# Patient Record
Sex: Female | Born: 1960 | Race: White | Hispanic: No | Marital: Married | Smoking: Former smoker
Health system: Southern US, Community
[De-identification: ages and names within clinical notes are randomized; demographics above are authoritative.]

## PROBLEM LIST (undated history)

## (undated) DIAGNOSIS — Z9889 Other specified postprocedural states: Secondary | ICD-10-CM

## (undated) DIAGNOSIS — Z789 Other specified health status: Secondary | ICD-10-CM

## (undated) DIAGNOSIS — R112 Nausea with vomiting, unspecified: Secondary | ICD-10-CM

## (undated) HISTORY — PX: ABDOMINAL HYSTERECTOMY: SHX81

---

## 2005-09-11 ENCOUNTER — Ambulatory Visit: Payer: Self-pay | Admitting: Orthopedic Surgery

## 2005-09-12 ENCOUNTER — Ambulatory Visit (HOSPITAL_COMMUNITY): Admission: RE | Admit: 2005-09-12 | Discharge: 2005-09-12 | Payer: Self-pay | Admitting: Orthopedic Surgery

## 2005-09-30 ENCOUNTER — Ambulatory Visit: Payer: Self-pay | Admitting: Orthopedic Surgery

## 2005-10-08 ENCOUNTER — Ambulatory Visit: Payer: Self-pay | Admitting: Orthopedic Surgery

## 2005-11-15 ENCOUNTER — Ambulatory Visit (HOSPITAL_COMMUNITY): Admission: RE | Admit: 2005-11-15 | Discharge: 2005-11-15 | Payer: Self-pay | Admitting: Orthopedic Surgery

## 2006-11-13 ENCOUNTER — Encounter: Payer: Self-pay | Admitting: Obstetrics and Gynecology

## 2006-11-13 ENCOUNTER — Inpatient Hospital Stay (HOSPITAL_COMMUNITY): Admission: RE | Admit: 2006-11-13 | Discharge: 2006-11-18 | Payer: Self-pay | Admitting: Obstetrics and Gynecology

## 2009-04-03 ENCOUNTER — Ambulatory Visit (HOSPITAL_COMMUNITY): Admission: RE | Admit: 2009-04-03 | Discharge: 2009-04-03 | Payer: Self-pay | Admitting: Obstetrics and Gynecology

## 2009-04-12 ENCOUNTER — Ambulatory Visit (HOSPITAL_COMMUNITY): Admission: RE | Admit: 2009-04-12 | Discharge: 2009-04-12 | Payer: Self-pay | Admitting: Obstetrics and Gynecology

## 2009-12-27 ENCOUNTER — Ambulatory Visit (HOSPITAL_COMMUNITY): Admission: RE | Admit: 2009-12-27 | Discharge: 2009-12-27 | Payer: Self-pay | Admitting: Obstetrics and Gynecology

## 2010-07-31 NOTE — Op Note (Signed)
NAMECEOLA, PARA NO.:  [id number]   MEDICAL RECORD NO.:  [id number]          PATIENT TYPE:  INP   LOCATION:  A309                          FACILITY:  [facility]   PHYSICIAN:  Tilda Burrow, M.D. DATE OF BIRTH:  1960-09-06   DATE OF PROCEDURE:  11/13/2006  DATE OF DISCHARGE:                               OPERATIVE REPORT   PREOPERATIVE DIAGNOSIS:  Postoperative hematoma.   POSTOPERATIVE DIAGNOSIS:  Postoperative hematoma.   PROCEDURE:  Evacuation of wound hematoma.   SURGEON:  Tilda Burrow, M.D.   ASSISTANT:  None.   ANESTHESIA:  General with LMA converted to GTA.   COMPLICATIONS:  None.   ESTIMATED BLOOD LOSS:  Less than 100 mL intraoperatively, greater than  1000 mL of clot removed.   FINDINGS:  A large hematoma that had dissected all the way across the  Pfannenstiel incision and into the adjacent connective tissue and fatty  tissue.  A large active subcu space bleeder, suspected arterial, in the  right side of the subcu fatty space.   DETAILS OF PROCEDURE:  The patient was taken to the operating room,  prepped and draped for lower abdominal surgery.  The old staples were  removed and the wound opened.  There was active bright red blood  identified on the right side and so the initial opening was focused on  that side.  There was active bright red blood that was trailed down to a  pulsatile area in the subcu fatty tissues on the right side.  Two  hemostats were placed in such a way as to cross clamp and achieve  hemostasis in this area.  We then proceeded with opening of the  remainder of the incision and evacuation of a huge amount of hematoma  from the subcu space.  The entire length of the incision was emptied of  hematoma.  There was some extravasation into the adjacent subcu fatty  tissues.  The fascial closure was completely intact, however, it was  obvious that there was serous drainage coming from beneath the fascial  closure.  The  decision was made that a peritoneal opening was required.  The patient was then placed under general anesthesia instead of the  Versed sedation to date.  LMA was initially placed and the abdomen  opened.  The peritoneal cavity was entered.  The sutures holding the  rectus muscles together in the midline had been disrupted.  The  peritoneal cavity was opened.  A generous amount of serous bloody fluid  was noted in the pelvis with absolutely no clots.  This was evidence  that the bleeding was from external and had worked its way back in.  The  patient had to be put to sleep due to some deep respiratory breathing  and Valsalva maneuver resulting in expulsion of the bowel through the  incision, so we placed general anesthesia allowing greater relaxation.  The bowel could then be manipulated, irrigated, and suctioned out, to  remove the bloody fluid.  Once the fluid began to clear, we inspected  the pelvic floor and no clots or evidence of  intra-abdominal bleeding  were encountered.  The peritoneal cavity was then closed using running 2-  0 chromic and then the muscle was pulled together, once again, with  interrupted 2-0 chromic.  The fascia was pulled back into place and  closed with running 0 Vicryl.  The right side of the incision was closed  as two layers, the external and internal oblique, in order to improve  flexibility on that side as the opening of those two layers had been  slightly asymmetric.  The subcu spaces were reinspected and confirmed as  being hemostatic.  There was some diffuse ooze from the intravasation  into the connective tissue so a JP drain was placed and allowed to exit  through a separate stab incision in the right lower quadrant.  The  patient then had reapproximation of the subcu fatty space with a series  of interrupted 2-0 plain sutures which pulled the skin edges into  approximation.  Stapled closure was used at this time with good skin  edge approximation and  suturing of the drain into place was performed.  The patient tolerated the procedure well and went to the recovery room  in good condition.  She will be monitored with abdominal pressure  dressing in place.      Tilda Burrow, M.D.  Electronically Signed     JVF/MEDQ  D:  11/13/2006  T:  11/14/2006  Job:  [id number]

## 2010-07-31 NOTE — H&P (Signed)
Natalie Hess, Natalie Hess              ACCOUNT NO.:  [id number]   MEDICAL RECORD NO.:  [id number]          PATIENT TYPE:  AMB   LOCATION:  DAY                           FACILITY:  [facility]   PHYSICIAN:  Tilda Burrow, M.D. DATE OF BIRTH:  09-16-1960   DATE OF ADMISSION:  DATE OF DISCHARGE:  LH                              HISTORY & PHYSICAL   ADMITTING DIAGNOSES:  1. Uterine fibroid, 14-15 weeks' size.  2. Right lower quadrant discomfort, ovarian versus uterine.   HISTORY OF PRESENT ILLNESS:  This 50 year old female, gravida 3, para 2,  AB1, status post C-section x2 and status post tubal ligation, is  admitted at this time for abdominal hysterectomy.  She has recently been  seen for GYN visit, complaining of sense of pelvic pressure and right  lower quadrant discomfort and there is some pain with her periods and  periods are described as heavy.  Laboratory evaluation included a  hemoglobin of 10.7, hematocrit 37 with normal thyroid function tests and  normal electrolytes, BUN 10, creatinine 0.9.  She is admitted for  hysterectomy.  She also desires that the right tube and ovary be  removed.  She has been having a lot of pain on that side.  We have  discussed the possibility that the pain may be related to the pressure  from the uterine fibroid and that there may be nothing wrong with the  right ovary; she nonetheless desires to proceed with removal.  We have  done a limited ultrasound in the office showing a small right ovarian  cyst and huge fibroid uterus at 14-15 weeks' size.  Plan is to excise  the old cicatrix from her C-sections as well.  She has some irregularity  of her prior C-section and has been meticulous in keeping her weight  down and desires to have this recontoured.   PAST MEDICAL HISTORY:  Benign.   SURGICAL HISTORY:  1. C-section x2.  2. Tubal ligation.  3. Herniorrhaphy.   FAMILY HISTORY:  Positive for melanoma in the mother.   SOCIAL HISTORY:  Nonsmoker,  nondrinker, rare social alcohol.  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   PHYSICAL EXAMINATION:  VITAL SIGNS:  Height 5 feet 3 inches, weight 126.  Blood pressure 128/78, pulse 72.  BMP 22.  Urinalysis negative.  Hemoglobin 9.9.  GENERAL:  Exam shows a healthy, attractive Caucasian female in no acute  distress.  SKIN:  Within normal limits.  NECK:  Supple.  Trachea midline.  Normal thyroid.  LUNGS:  Clear to auscultation.  ABDOMEN:  Normal.  BREASTS:  Exam deferred.  ABDOMEN:  Nontender without tenderness with the uterus palpable above  the symphysis pubis, 14-15 weeks' size.  RECTAL:  Hemoccult negative.   ACCESSORY CLINICAL DATA:  Pap smear has returned normal.   PLAN:  Abdominal hysterectomy and right salpingo-oophorectomy on November 12, 2006.      Tilda Burrow, M.D.  Electronically Signed     JVF/MEDQ  D:  11/12/2006  T:  11/13/2006  Job:  [id number]   cc:   [facility]

## 2010-07-31 NOTE — Discharge Summary (Signed)
NAMEJOSCELIN, Natalie Hess NO.:  [id number]   MEDICAL RECORD NO.:  [id number]          PATIENT TYPE:  INP   LOCATION:  A309                          FACILITY:  [facility]   PHYSICIAN:  Tilda Burrow, M.D. DATE OF BIRTH:  10/07/1960   DATE OF ADMISSION:  11/13/2006  DATE OF DISCHARGE:  09/02/2008LH                               DISCHARGE SUMMARY   ADMISSION DIAGNOSES:  1. Uterine fibroids.  2. Right lower quadrant pain.   DISCHARGE DIAGNOSES:  1. Uterine fibroids.  2. Right lower quadrant pain.  3. Postoperative wound hematoma.  4. Anemia, secondary to postoperative wound hematoma.   PROCEDURE:  1. On November 13, 2006, a total abdominal hysterectomy, right salpingo-      oophorectomy, Dr. Tilda Burrow.  2. Also on November 13, 2006, a re-exploration of  the abdomen with      evacuation of wound hematoma and the placement of a JP drain.   DISCHARGE MEDICATIONS:  1. Repliva one tab daily x30 days.  2. MiraLax laxative 17 grams p.o. twice daily with water.  3. Percocet 5/325 mg, one p.o. q.6h. p.r.n. pain.  4. Ambien 10 mg p.o. at bedtime.  5. Hydrochlorothiazide 25 mg #14 tab, one tab x2 weeks.   HISTORY:  This slim 69+ year old female was admitted for an abdominal  hysterectomy, right salpingo-oophorectomy as describes.  The operative  report indicates the straightforward surgery.  Unfortunately during the  postoperative phase she began to have incisional bleeding which was  fairly vigorous, even though the incision was dry at the time of the  initial surgery.  She had an admitting hemoglobin of 9, hematocrit 36.5,  with the hemoglobin dropping to 8.3 and hematocrit of 23.6 on  postoperative day one, staying in that range for three days.  On  November 17, 2006, she had a 6.9 hemoglobin and hematocrit of 20.4, felt  attributable to fluid hydration and return of third space and fluid into  the vascular system.  She otherwise felt subjectively good enough at  that  time and a cath and transfusion with 1 unit of packed cells and go  back to her routine size at this point, was performed.  The patient  remained stable.   She was discharged home, having finally done well, on November 18, 2006,  for followup in three days for staple removal and Pratt drain removal.      Tilda Burrow, M.D.  Electronically Signed     JVF/MEDQ  D:  11/18/2006  T:  11/18/2006  Job:  [id number]

## 2010-07-31 NOTE — Op Note (Signed)
Natalie Hess, Natalie Hess NO.:  [id number]   MEDICAL RECORD NO.:  [id number]          PATIENT TYPE:  INP   LOCATION:  A309                          FACILITY:  [facility]   PHYSICIAN:  Tilda Burrow, M.D. DATE OF BIRTH:  1960-05-25   DATE OF PROCEDURE:  DATE OF DISCHARGE:                               OPERATIVE REPORT   PREOPERATIVE DIAGNOSES:  1. Uterine fibroids.  2. Right lower quadrant pain.   POSTOPERATIVE DIAGNOSES:  1. Uterine fibroids.  2. Right lower quadrant pain.   PROCEDURE PERFORMED:  Total abdominal hysterectomy, right salpingo-  oophorectomy.   SURGEON:  Tilda Burrow, M.D.   ASSISTANTAnnabell Howells, RN.   ANESTHESIA:  General.   COMPLICATIONS:  None.   FINDINGS:  Large 14-15 week size uterus, anterior 10 cm fibroid, very  high position of the bladder flat, normal-appearing tube and ovary,  adhesions from the left ovary to the cul de sac.   DETAILS OF PROCEDURE:  The patient was taken to the operating room,  prepped and draped.  A Pfannenstiel incision repeated with the excision  of cicatrix.  In opening the fascia, the rectus muscles were quite far  apart and the peritoneal cavity was entered quickly and during the  dissection of the upper elevation of the fascia.  The contents were  inspected and there was absolutely no evidence of injury.  Only omental  fat tissues were directly beneath the opening.  The opening was  extended, omental adhesions to anterior abdominal wall immobilized, and  Alexis wound retractor positioned.  The large uterus could be extracted  through the incision with total access  to the round ligaments on each  side, which were clamped, cut and suture ligated.  The bladder flap was  extremely high on the anterior uterus, reaching out to almost the top of  the anterior fibroid, reaching out to the level of the round ligament on  the right.  Bladder flap was carefully immobilized and at no time with  any suspicion of bladder  injury.  Once the bladder was down  sufficiently, we could identify the right adnexal structures with ease.  The infundibulopelvic ligament was isolated, and the ureter palpably  identified well out of harms way out on the right side, far below where  we were working.  The right IP ligament was clamped, cut and suture  ligated and uterine vessels skeletonized on both sides.  The left side  was treated with round ligament takedown followed by isolation of the  uterus of the left uteroovarian ligament and broad ligament.  Thus, the  specimen was clamped, cut and suture ligated.  The uterine vessels were  skeletonized, crossclamped with a curved Heaney clamp bilaterally,  transected and suture ligated.  The upper cardinal ligaments were  clamped, cut and suture ligated.  At this point, we were only a couple  of centimeters from the anterior cervical vaginal fornix.  The uterus  was amputated off the vaginal cuff for Improved visibility.  The  pediclesinspected.  The anterior cervical vaginal fornix could be  identified and a stab incision made  into the vagina with Kocher clamp  used to grasp the vaginal mucosa.  Cervix was circumscribed and  amputated off of the vaginal cuff.  Aldridge stitches were then placed  in each lateral vaginal angle.  Vaginal cuff was fairly well supported  and at this point, closure of the cuff consists of a placement of an  Aldridge stitch at each lateral vaginal angle to attach the cuff to the  lower cardinal ligaments.  Then, a continuous running locking closure of  the vaginal cuff with good hemostasis.  The pelvis was inspected,  irrigated and no abnormalities identified other than adhesions from the  left ovary to the pelvic floor, which were freed up.  The irrigation of  the pelvis followed sponge and needle counts were correct.  Anterior  peritoneum closed.  We had to modify the fascial closure as it appeared  that the rectus muscles had some diastasis,  despite her overall good  health.  The rectus muscles were loosely reapproximated in the midline  after peritoneal closure with 2-0 Chromic.  Final sponge and needle  counts were correct.  The fascia was closed using 0 Vicryl.  Subcu fatty  tissues were irrigated, reapproximated with 2-0 Chromic.  Staple closure  of the skin was necessary on the left 1/3 of the incision.  Keith needle  subcuticular closure with overlying Dermabond was possible in the right  2/3 of the incision but due to oozing, the left 1/3 of the incision did  require some staple closure.  Staples will be taken out shortly.  The  patient tolerated the procedure well, went to recovery room in good  condition.  Sponge and needle counts correct.      Tilda Burrow, M.D.  Electronically Signed     JVF/MEDQ  D:  11/13/2006  T:  11/14/2006  Job:  [id number]

## 2010-12-28 LAB — DIFFERENTIAL
Basophils Absolute: 0
Basophils Absolute: 0
Basophils Absolute: 0.1
Basophils Relative: 1
Basophils Relative: 0
Basophils Relative: 0
Basophils Relative: 1
Eosinophils Absolute: 0.2
Eosinophils Relative: 0
Eosinophils Relative: 3
Lymphocytes Relative: 14
Lymphocytes Relative: 14
Lymphocytes Relative: 2 — ABNORMAL LOW
Lymphs Abs: 0.3 — ABNORMAL LOW
Monocytes Absolute: 0.3
Monocytes Relative: 9
Monocytes Relative: 5
Monocytes Relative: 7
Monocytes Relative: 8
Neutro Abs: 3.8
Neutro Abs: 4.2
Neutro Abs: 6.4
Neutrophils Relative %: 69
Neutrophils Relative %: 75
Neutrophils Relative %: 75

## 2010-12-28 LAB — CBC
HCT: 24.6 — ABNORMAL LOW
HCT: 24.7 — ABNORMAL LOW
HCT: 26.7 — ABNORMAL LOW
HCT: 32 — ABNORMAL LOW
Hemoglobin: 6.9 — CL
Hemoglobin: 11.9 — ABNORMAL LOW
Hemoglobin: 8.3 — ABNORMAL LOW
Hemoglobin: 8.8 — ABNORMAL LOW
MCHC: 33.5
MCHC: 32.5
MCV: 76.8 — ABNORMAL LOW
MCV: 74.9 — ABNORMAL LOW
MCV: 77.1 — ABNORMAL LOW
Platelets: 189
Platelets: 209
Platelets: 240
RBC: 2.66 — ABNORMAL LOW
RBC: 3.24 — ABNORMAL LOW
RBC: 4.87
RDW: 24.1 — ABNORMAL HIGH
RDW: 25 — ABNORMAL HIGH
WBC: 5.2
WBC: 5.6

## 2010-12-28 LAB — BASIC METABOLIC PANEL
BUN: 2 — ABNORMAL LOW
CO2: 26
Chloride: 104
Chloride: 102
Creatinine, Ser: 0.56
GFR calc Af Amer: >60
Glucose, Bld: 90
Glucose, Bld: 114 — ABNORMAL HIGH
Potassium: 3.8
Sodium: 137

## 2010-12-28 LAB — CROSSMATCH

## 2010-12-28 LAB — TYPE AND SCREEN: Antibody Screen: NEGATIVE

## 2010-12-28 LAB — COMPREHENSIVE METABOLIC PANEL
ALT: 13
CO2: 29
Calcium: 9
Creatinine, Ser: 0.73
GFR calc non Af Amer: >60
Glucose, Bld: 91

## 2010-12-28 LAB — HCG, QUANTITATIVE, PREGNANCY: hCG, Beta Chain, Quant, S: <2

## 2011-07-18 ENCOUNTER — Other Ambulatory Visit: Payer: Self-pay | Admitting: Adult Health

## 2011-07-18 DIAGNOSIS — Z139 Encounter for screening, unspecified: Secondary | ICD-10-CM

## 2011-07-18 DIAGNOSIS — R6889 Other general symptoms and signs: Secondary | ICD-10-CM

## 2011-07-31 ENCOUNTER — Ambulatory Visit (HOSPITAL_COMMUNITY)
Admission: RE | Admit: 2011-07-31 | Discharge: 2011-07-31 | Disposition: A | Discharge status: Home / Self Care | Payer: 59 | Source: Ambulatory Visit | Attending: Adult Health | Admitting: Adult Health

## 2011-07-31 DIAGNOSIS — R6889 Other general symptoms and signs: Secondary | ICD-10-CM

## 2011-07-31 DIAGNOSIS — R928 Other abnormal and inconclusive findings on diagnostic imaging of breast: Secondary | ICD-10-CM | POA: Insufficient documentation

## 2012-03-30 ENCOUNTER — Other Ambulatory Visit: Payer: Self-pay | Admitting: Orthopedic Surgery

## 2012-03-30 DIAGNOSIS — M62838 Other muscle spasm: Secondary | ICD-10-CM

## 2012-03-30 MED ORDER — METHOCARBAMOL 500 MG PO TABS: 500.0000 mg | ORAL_TABLET | Freq: Four times a day (QID) | ORAL | Status: DC | PRN

## 2012-03-30 NOTE — Telephone Encounter (Signed)
52 yo WF left sided neck spasm no trauma  Normal neuro exam mild stiffness left side of neck more pain with flexion   Tender left trap

## 2012-03-31 ENCOUNTER — Other Ambulatory Visit: Payer: Self-pay | Admitting: Orthopedic Surgery

## 2012-03-31 DIAGNOSIS — M62838 Other muscle spasm: Secondary | ICD-10-CM

## 2012-03-31 MED ORDER — METHOCARBAMOL 500 MG PO TABS: 500.0000 mg | ORAL_TABLET | Freq: Four times a day (QID) | ORAL | Status: DC

## 2013-03-03 ENCOUNTER — Other Ambulatory Visit: Payer: Self-pay | Admitting: Obstetrics and Gynecology

## 2013-03-03 DIAGNOSIS — Z139 Encounter for screening, unspecified: Secondary | ICD-10-CM

## 2013-03-05 ENCOUNTER — Ambulatory Visit (HOSPITAL_COMMUNITY)
Admission: RE | Admit: 2013-03-05 | Discharge: 2013-03-05 | Disposition: A | Discharge status: Home / Self Care | Payer: 59 | Source: Ambulatory Visit | Attending: Obstetrics and Gynecology | Admitting: Obstetrics and Gynecology

## 2013-03-05 DIAGNOSIS — Z139 Encounter for screening, unspecified: Secondary | ICD-10-CM

## 2013-03-05 DIAGNOSIS — Z1231 Encounter for screening mammogram for malignant neoplasm of breast: Secondary | ICD-10-CM | POA: Insufficient documentation

## 2013-12-14 ENCOUNTER — Ambulatory Visit (HOSPITAL_COMMUNITY)
Admission: RE | Admit: 2013-12-14 | Discharge: 2013-12-14 | Disposition: A | Discharge status: Home / Self Care | Payer: 59 | Source: Ambulatory Visit | Attending: Orthopaedic Surgery | Admitting: Orthopaedic Surgery

## 2013-12-14 DIAGNOSIS — M25519 Pain in unspecified shoulder: Secondary | ICD-10-CM | POA: Diagnosis not present

## 2013-12-14 DIAGNOSIS — M25619 Stiffness of unspecified shoulder, not elsewhere classified: Secondary | ICD-10-CM | POA: Diagnosis not present

## 2013-12-14 DIAGNOSIS — M6281 Muscle weakness (generalized): Secondary | ICD-10-CM | POA: Insufficient documentation

## 2013-12-14 DIAGNOSIS — M25511 Pain in right shoulder: Secondary | ICD-10-CM

## 2013-12-14 DIAGNOSIS — M25611 Stiffness of right shoulder, not elsewhere classified: Secondary | ICD-10-CM | POA: Insufficient documentation

## 2013-12-14 DIAGNOSIS — IMO0001 Reserved for inherently not codable concepts without codable children: Secondary | ICD-10-CM | POA: Insufficient documentation

## 2013-12-14 NOTE — Evaluation (Signed)
Note reviewed by clinical instructor and accurately reflects treatment session.  Icy Fuhrmann, OTR/L,CBIS   

## 2013-12-14 NOTE — Evaluation (Signed)
Occupational Therapy Evaluation  Patient Details  Name: Natalie Hess MRN: [id number] Date of Birth: 1961/03/12  Today's Date: 12/14/2013 Time: 3220-2542 OT Time Calculation (min): 41 min OT eval: [id number] 83'  Visit#: 1 of 8  Re-eval: 01/11/14  Assessment Diagnosis: Right shoulder pain Next MD Visit: None Prior Therapy: None  Past Medical History: No past medical history on file. Past Surgical History: No past surgical history on file.  Subjective Symptoms/Limitations Symptoms: S: I'm just tired of dealing with pain all the time and the chiropractor only helps for a short time so I decided to go ahead and come here.  Pertinent History: Pt is a 53 y/o female with right shoulder pain and stiffness.  Pt reports difficulty with daily tasks including making a bed, reaching behind her, throwing motions, and riding a lawnmower.  Pt reports she has been dealing with the pain for approximately 1 year and has been seeing a chiropractor, however those treatments only help for a short time. 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 Patient reports working out at the gym will aggravate the shoulder sometimes. Patient uses prescription pain medication and ice for pain management.  Dr. Luna Glasgow referred patient to occupational therapy for evaluation and treatment.   Special Tests: FOTO Score: 61/100 (39% impairment) Patient Stated Goals: To get rid of the pain in my shoulder when I move it.  Pain Assessment Currently in Pain?: Yes Pain Score: 4  Pain Location: Shoulder Pain Orientation: Right Pain Type: Acute pain  Precautions/Restrictions  Precautions Precautions: None  Balance Screening Balance Screen Has the patient fallen in the past 6 months: No Has the patient had a decrease in activity level because of a fear of falling? : No Is the patient reluctant to leave their home because of a fear of falling? : No  Prior  [place] expects to be discharged to:: Private residence Living Arrangements: Spouse/significant other;Children Available Help at Discharge: Family Prior Function  Able to Take Stairs?: Yes Driving: Yes Vocation: Full time employment Vocation Requirements: Bending, lifting heavy objects/people, twisting, reaching; lots of repetitive motions Leisure: Hobbies-yes (Comment) Comments: yardwork/mowing, working out at gym  Assessment ADL/Vision/Perception ADL ADL Comments: Patient reports short periods of pain during daily activities-throwing, lifting, reaching behind her, making bed.  Dominant Hand: Left Vision - History Baseline Vision: No visual deficits  Cognition/Observation Cognition Overall Cognitive Status: Within Functional Limits for tasks assessed Arousal/Alertness: Awake/alert Orientation Level: Oriented X4    Additional Assessments RUE AROM (degrees) RUE Overall AROM Comments:  (Assessed in standing, IR/ER adducted) Right Shoulder Flexion: 141 Degrees Right Shoulder ABduction: 152 Degrees Right Shoulder Internal Rotation: 90 Degrees Right Shoulder External Rotation: 74 Degrees RUE Strength RUE Overall Strength Comments: assessed in sitting Right Shoulder Flexion: 4/5 Right Shoulder ABduction: 4/5 Right Shoulder Internal Rotation: 3+/5 Right Shoulder External Rotation: 4/5 Palpation Palpation: Mod fascial restrictions in the right upper arm, trapezius, and scapularis regions.           Occupational Therapy Assessment and Plan OT Assessment and Plan Clinical Impression Statement: A: Pt is a 53 y/o female presenting with right shoulder pain and stiffness, causing increased pain and fascial restrictions, decreased joint mobility and decreased strength resulting in difficulty completing daily activities and work tasks.   Pt will benefit from skilled therapeutic intervention in order to improve on the following deficits: Impaired UE  functional use;Increased fascial restricitons;Decreased range of motion;Decreased strength;Pain Rehab  Potential: Excellent OT Frequency: Min 2X/week OT Duration: 4 weeks OT Treatment/Interventions: Therapeutic activities;Therapeutic exercise;Patient/family education;Manual therapy;Modalities;Self-care/ADL training OT Plan: P: Pt will benefit from skilled occupational therapy interventions in order to decrease pain, increase ROM and strength, and increase overall RUE functional use. Treatment Plan: AROM, MFR and manual stretching, scapular strengtheing and proximal stabilization, RUE general strengthening.    Goals Short Term Goals Time to Complete Short Term Goals: 2 weeks Short Term Goal 1: Patient will be educated on HEP.  Short Term Goal 2: Patient will decrease fascial restrictions from mod to min amount.  Short Term Goal 3: Patient will decrease pain to 4/10 during daily  tasks.  Long Term Goals Time to Complete Long Term Goals: 4 weeks Long Term Goal 1: Patient will return to highest level of independence during daily and work tasks.  Long Term Goal 2: Patient will decrease fascial restrictions from min to trace amounts or less.  Long Term Goal 3: Patient will decrease pain to 1/10 or less during daily tasks.  Long Term Goal 4: Patient will increase AROM to WNL to increase ability to reach for supplies in overhead cabinets.  Long Term Goal 5: Patient will increase strength to 5/5 to increase ability to lift heavy objects during work tasks.   Problem List Patient Active Problem List   Diagnosis Date Noted  . Pain in joint, shoulder region 12/14/2013  . Muscle weakness (generalized) 12/14/2013  . Decreased range of motion of right shoulder 12/14/2013    End of Session Activity Tolerance: Patient tolerated treatment well General Behavior During Therapy: [facility] for tasks assessed/performed OT Plan of Care OT Home Exercise Plan: shoulder stretches OT Patient Instructions: Handout  (scanned) Consulted and Agree with Plan of Care: Patient    Guadelupe Sabin. OT Student  12/14/2013, 4:53 PM  Physician Documentation Your signature is required to indicate approval of the treatment plan as stated above.  Please sign and either send electronically or make a copy of this report for your files and return this physician signed original.  Please mark one 1.__approve of plan  2. ___approve of plan with the following conditions.   ______________________________                                                          _____________________ Physician Signature                                                                                                             Date

## 2013-12-15 ENCOUNTER — Other Ambulatory Visit (HOSPITAL_COMMUNITY): Payer: Self-pay | Admitting: Orthopaedic Surgery

## 2013-12-15 DIAGNOSIS — M25511 Pain in right shoulder: Secondary | ICD-10-CM

## 2013-12-16 ENCOUNTER — Ambulatory Visit (HOSPITAL_COMMUNITY)
Admission: RE | Admit: 2013-12-16 | Discharge: 2013-12-16 | Disposition: A | Discharge status: Home / Self Care | Payer: 59 | Source: Ambulatory Visit | Attending: Orthopaedic Surgery | Admitting: Orthopaedic Surgery

## 2013-12-16 DIAGNOSIS — M25611 Stiffness of right shoulder, not elsewhere classified: Secondary | ICD-10-CM | POA: Diagnosis not present

## 2013-12-16 DIAGNOSIS — Z5189 Encounter for other specified aftercare: Secondary | ICD-10-CM | POA: Diagnosis present

## 2013-12-16 DIAGNOSIS — M6281 Muscle weakness (generalized): Secondary | ICD-10-CM | POA: Diagnosis not present

## 2013-12-16 DIAGNOSIS — M25511 Pain in right shoulder: Secondary | ICD-10-CM | POA: Diagnosis not present

## 2013-12-16 NOTE — Progress Notes (Signed)
Occupational Therapy Treatment Patient Details  Name: Natalie Hess MRN: [id number] Date of Birth: Apr 24, 1960  Today's Date: 12/16/2013 Time: 6384-6659 OT Time Calculation (min): 39 min Manual 1525-1548 (23') Therapeutic Exercises 9357-0177 (16')  Visit#: 2 of 8  Re-eval: 01/11/14    Authorization:    Authorization Time Period:    Authorization Visit#:   of    Subjective Symptoms/Limitations Symptoms: "its not bad, just certain ways I move." Pain Assessment Currently in Pain?: Yes Pain Score: 3  Pain Location: Shoulder Pain Orientation: Right Pain Type: Acute pain  Precautions/Restrictions     Exercise/Treatments Supine Protraction: PROM;5 reps;AROM;10 reps Horizontal ABduction: PROM;5 reps;AROM;10 reps External Rotation: PROM;5 reps;AROM;10 reps Internal Rotation: PROM;5 reps;AROM;10 reps Flexion: PROM;5 reps;AROM;10 reps ABduction: PROM;5 reps;AROM;10 reps Seated Elevation: AROM;10 reps Extension: AROM;10 reps Row: AROM;10 reps    Manual Therapy Manual Therapy: Myofascial release Myofascial Release: myofascial release (MFR) and manual stretching to RUE bicep, upper arm, upper trap, and scap regions to decre fascail restrictiosn and promote decreased pain.  Occupational Therapy Assessment and Plan OT Assessment and Plan Clinical Impression Statement: Pt had injection on Monday of this week, and has some improvements in pain. Initiated MFR, PROM, and AROM this session, with good tolerance.  Educated pt on use of tennis ball or cane for trigger point release on shoulder blade. pt verbalized understanding and said she would purchase tennis balls.  Also educated on use of ice vs ehat and recommended heat for decreasing musle tightness. OT Plan: Increase AROM reps.  Add scapular theraband.  Follow up on tennis balls for trigger point release.   Goals Short Term Goals Short Term Goal 1: Patient will be educated on HEP.  Short Term Goal 1 Progress: Progressing toward  goal Short Term Goal 2: Patient will decrease fascial restrictions from mod to min amount.  Short Term Goal 2 Progress: Progressing toward goal Short Term Goal 3: Patient will decrease pain to 4/10 during daily  tasks.  Short Term Goal 3 Progress: Progressing toward goal Long Term Goals Long Term Goal 1: Patient will return to highest level of independence during daily and work tasks.  Long Term Goal 1 Progress: Progressing toward goal Long Term Goal 2: Patient will decrease fascial restrictions from min to trace amounts or less.  Long Term Goal 2 Progress: Progressing toward goal Long Term Goal 3: Patient will decrease pain to 1/10 or less during daily tasks.  Long Term Goal 3 Progress: Progressing toward goal Long Term Goal 4: Patient will increase AROM to WNL to increase ability to reach for supplies in overhead cabinets.  Long Term Goal 4 Progress: Progressing toward goal Long Term Goal 5: Patient will increase strength to 5/5 to increase ability to lift heavy objects during work tasks.  Long Term Goal 5 Progress: Progressing toward goal  Problem List Patient Active Problem List   Diagnosis Date Noted  . Pain in joint, shoulder region 12/14/2013  . Muscle weakness (generalized) 12/14/2013  . Decreased range of motion of right shoulder 12/14/2013    End of Session Activity Tolerance: Patient tolerated treatment well General Behavior During Therapy: [facility] for tasks assessed/performed  GO    Bea Graff [place], [place], OTR/L [place] [phone] 12/16/2013, 4:56 PM

## 2013-12-21 ENCOUNTER — Ambulatory Visit (HOSPITAL_COMMUNITY): Payer: 59

## 2013-12-22 ENCOUNTER — Ambulatory Visit (HOSPITAL_COMMUNITY)
Admission: RE | Admit: 2013-12-22 | Discharge: 2013-12-22 | Disposition: A | Discharge status: Home / Self Care | Payer: 59 | Source: Ambulatory Visit

## 2013-12-22 DIAGNOSIS — Z5189 Encounter for other specified aftercare: Secondary | ICD-10-CM | POA: Diagnosis not present

## 2013-12-22 NOTE — Progress Notes (Signed)
Occupational Therapy Treatment Patient Details  Name: Natalie Hess MRN: [id number] Date of Birth: [birth year]  Today's Date: 12/22/2013 Time: 6226-3335 OT Time Calculation (min): 42 min MFR: 4562-5638 93' Therex: 7342-8768 29'  Visit#: 3 of 8  Re-eval: 01/11/14   Subjective Symptoms/Limitations Symptoms: S: I tried that tennis ball, but I just couldn't stand it.  Pain Assessment Currently in Pain?: No/denies  Precautions/Restrictions  Precautions Precautions: None  Exercise/Treatments Supine Protraction: PROM;5 reps;AROM;12 reps Horizontal ABduction: PROM;5 reps;AROM;12 reps External Rotation: PROM;5 reps;AROM;12 reps Internal Rotation: PROM;5 reps;AROM;12 reps Flexion: PROM;5 reps;AROM;12 reps ABduction: PROM;5 reps;AROM;12 reps   Standing Protraction: AROM;12 reps Horizontal ABduction: AROM;12 reps External Rotation: AROM;12 reps Internal Rotation: AROM;12 reps Flexion: AROM;12 reps ABduction: AROM;12 reps Extension: Theraband;12 reps Theraband Level (Shoulder Extension): Level 2 (Red) Row: Theraband;12 reps Theraband Level (Shoulder Row): Level 2 (Red) Retraction: Theraband;12 reps Theraband Level (Shoulder Retraction): Level 2 (Red)        Manual Therapy Manual Therapy: Myofascial release Myofascial Release: myofascial release (MFR) and manual stretching to RUE bicep, upper arm, upper trap, and scap regions to decre fascail restrictiosn and promote decreased pain  Occupational Therapy Assessment and Plan OT Assessment and Plan Clinical Impression Statement: A: Added AROM exercises in standing. Increased AROM repetitions to 12. Added red scapular theraband exercises. Patient tolerated treatment well. Patient reports pain around anterior shoulder-anterior deltoid and pec minor regions, and feels this is where most of her problem is. Patient reports she tried the tennis ball for trigger point release but it was too painful and she could not handle that exercise.  Discussed cutting back on workout routine-either reducing weight amount, repetitions, or number of workouts per week.   OT Plan: P: Focus on fascial restrictions around scapula and on tightness in anterior deltoid/pec minor regions. Follow up on workout routine.    Goals Short Term Goals Short Term Goal 1: Patient will be educated on HEP.  Short Term Goal 1 Progress: Progressing toward goal Short Term Goal 2: Patient will decrease fascial restrictions from mod to min amount.  Short Term Goal 2 Progress: Progressing toward goal Short Term Goal 3: Patient will decrease pain to 4/10 during daily  tasks.  Short Term Goal 3 Progress: Progressing toward goal Long Term Goals Long Term Goal 1: Patient will return to highest level of independence during daily and work tasks.  Long Term Goal 1 Progress: Progressing toward goal Long Term Goal 2: Patient will decrease fascial restrictions from min to trace amounts or less.  Long Term Goal 2 Progress: Progressing toward goal Long Term Goal 3: Patient will decrease pain to 1/10 or less during daily tasks.  Long Term Goal 3 Progress: Progressing toward goal Long Term Goal 4: Patient will increase AROM to WNL to increase ability to reach for supplies in overhead cabinets.  Long Term Goal 4 Progress: Progressing toward goal Long Term Goal 5: Patient will increase strength to 5/5 to increase ability to lift heavy objects during work tasks.  Long Term Goal 5 Progress: Progressing toward goal  Problem List Patient Active Problem List   Diagnosis Date Noted  . Pain in joint, shoulder region 12/14/2013  . Muscle weakness (generalized) 12/14/2013  . Decreased range of motion of right shoulder 12/14/2013    End of Session Activity Tolerance: Patient tolerated treatment well General Behavior During Therapy: [facility] for tasks assessed/performed   Guadelupe Sabin. OT Student  12/22/2013, 4:12 PM

## 2013-12-22 NOTE — Progress Notes (Signed)
Note reviewed by clinical instructor and accurately reflects treatment session.  Laura Essenmacher, OTR/L,CBIS   

## 2013-12-27 ENCOUNTER — Ambulatory Visit (HOSPITAL_COMMUNITY)
Admission: RE | Admit: 2013-12-27 | Discharge: 2013-12-27 | Disposition: A | Discharge status: Home / Self Care | Payer: 59 | Source: Ambulatory Visit | Attending: Family Medicine | Admitting: Family Medicine

## 2013-12-27 DIAGNOSIS — Z5189 Encounter for other specified aftercare: Secondary | ICD-10-CM | POA: Diagnosis not present

## 2013-12-27 NOTE — Progress Notes (Signed)
Occupational Therapy Treatment Patient Details  Name: Natalie Hess MRN: [id number] Date of Birth: 1960-11-04  Today's Date: 12/27/2013 Time: 1520-1610 OT Time Calculation (min): 50 min Manual 1520-1540 (20') Therapeutic Exercises 1540-1610 (30')  Visit#: 4 of 8  Re-eval: 01/11/14    Authorization:    Authorization Time Period:    Authorization Visit#:   of    Subjective Symptoms/Limitations Symptoms: "Its feeling better in there. That injection really helped." Pain Assessment Currently in Pain?: No/denies  Precautions/Restrictions     Exercise/Treatments Supine Protraction: PROM;5 reps;AROM;15 reps Horizontal ABduction: PROM;5 reps;AROM;15 reps External Rotation: PROM;5 reps;AROM;15 reps Internal Rotation: PROM;5 reps;AROM;15 reps Flexion: PROM;5 reps;AROM;15 reps ABduction: PROM;5 reps;AROM;15 reps Stretches Internal Rotation Stretch: 3 reps (hooking DIP into left back pocket, and holding for 10 second)\\ Manual Therapy Manual Therapy: Myofascial release Myofascial Release: myofascial release (MFR) and manual stretching to RUE bicep, upper arm, upper trap, and scap regions to decrease fascial restrictions and promote decreased pain.    Occupational Therapy Assessment and Plan OT Assessment and Plan Clinical Impression Statement: Pt reports continued good results after injection.  Increased supine AROM reps this session and pt tolerated well.  Pt indicated she uses 5# weights during workouts at gym, and continues workouts despite increased pain.  Encouraged pt to take care to not further injure her shoulder. Pt expressed concnern about not being able to reach behind her back.  discussed IR stretch with pt - pt states that towel stretch is too painful.  Modified stretch so that pt is aiming to hold her pointer right DIP into left back pants pocket for 10 seconds at a time, and increase time as able.   OT Plan: Increase to 1# in supine and standing exercises.  Encourage pt to  use [facility] rahter than 5#.   Goals Short Term Goals Short Term Goal 1: Patient will be educated on HEP.  Short Term Goal 1 Progress: Progressing toward goal Short Term Goal 2: Patient will decrease fascial restrictions from mod to min amount.  Short Term Goal 2 Progress: Progressing toward goal Short Term Goal 3: Patient will decrease pain to 4/10 during daily  tasks.  Short Term Goal 3 Progress: Progressing toward goal Long Term Goals Long Term Goal 1: Patient will return to highest level of independence during daily and work tasks.  Long Term Goal 1 Progress: Progressing toward goal Long Term Goal 2: Patient will decrease fascial restrictions from min to trace amounts or less.  Long Term Goal 2 Progress: Progressing toward goal Long Term Goal 3: Patient will decrease pain to 1/10 or less during daily tasks.  Long Term Goal 3 Progress: Progressing toward goal Long Term Goal 4: Patient will increase AROM to WNL to increase ability to reach for supplies in overhead cabinets.  Long Term Goal 4 Progress: Progressing toward goal Long Term Goal 5: Patient will increase strength to 5/5 to increase ability to lift heavy objects during work tasks.  Long Term Goal 5 Progress: Progressing toward goal  Problem List Patient Active Problem List   Diagnosis Date Noted  . Pain in joint, shoulder region 12/14/2013  . Muscle weakness (generalized) 12/14/2013  . Decreased range of motion of right shoulder 12/14/2013    End of Session Activity Tolerance: Patient tolerated treatment well General Behavior During Therapy: [facility] for tasks assessed/performed  GO    Bea Graff Laporcha Marchesi, [place], OTR/L [place] [phone] 12/27/2013, 4:20 PM

## 2013-12-30 ENCOUNTER — Ambulatory Visit (HOSPITAL_COMMUNITY)
Admission: RE | Admit: 2013-12-30 | Discharge: 2013-12-30 | Disposition: A | Discharge status: Home / Self Care | Payer: 59 | Source: Ambulatory Visit | Attending: Orthopaedic Surgery | Admitting: Orthopaedic Surgery

## 2013-12-30 DIAGNOSIS — Z5189 Encounter for other specified aftercare: Secondary | ICD-10-CM | POA: Diagnosis not present

## 2013-12-30 NOTE — Progress Notes (Signed)
Occupational Therapy Treatment Patient Details  Name: Natalie Hess MRN: [id number] Date of Birth: December 29, 1960  Today's Date: 12/30/2013 Time: 1524-1600 OT Time Calculation (min): 36 min Manual 1324-1441 (17') Therapeutic Exercises 1441-1600 (23')  Visit#: 5 of 8  Re-eval: 01/11/14    Authorization:    Authorization Time Period:    Authorization Visit#:   of    Subjective Symptoms/Limitations Symptoms: "Its fine right now. [facility] Pain Assessment Currently in Pain?: No/denies  Exercise/Treatments Supine Protraction: PROM;5 reps;Strengthening;12 reps;Weights Protraction Weight (lbs): 1 Horizontal ABduction: PROM;5 reps;Strengthening;12 reps;Weights Horizontal ABduction Weight (lbs): 1 External Rotation: PROM;5 reps;Strengthening;12 reps;Weights External Rotation Weight (lbs): 1 Internal Rotation: PROM;5 reps;Strengthening;12 reps;Weights Internal Rotation Weight (lbs): 1 Flexion: PROM;5 reps;Strengthening;12 reps;Weights Shoulder Flexion Weight (lbs): 1 ABduction: PROM;5 reps;Strengthening;12 reps;Weights Shoulder ABduction Weight (lbs): 1 Standing Protraction: Strengthening;12 reps;Weights Protraction Weight (lbs): 1 Horizontal ABduction: Strengthening;12 reps;Weights Horizontal ABduction Weight (lbs): 1 External Rotation: Strengthening;12 reps;Weights External Rotation Weight (lbs): 1 Internal Rotation: Weights;Strengthening;12 reps Internal Rotation Weight (lbs): 1 Flexion: Strengthening;12 reps;Weights Shoulder Flexion Weight (lbs): 1 ABduction: Strengthening;12 reps;Weights Shoulder ABduction Weight (lbs): 1 ROM / Strengthening / Isometric Strengthening "W" Arms: 10x with 1# X to V Arms: 10# with 1# Proximal Shoulder Strengthening, Supine: 12x each with 1# weight Proximal Shoulder Strengthening, Seated: 10x weach with 1# weight   Manual Therapy Manual Therapy: Myofascial release Myofascial Release: myofascial release (MFR) and  manual stretching to RUE bicep, upper arm, upper trap, and scap regions to decrease fascial restrictions and promote decreased pain.    Occupational Therapy Assessment and Plan OT Assessment and Plan Clinical Impression Statement: discussed activites at [facility] with pt and concerns about continugin to injure shoulder when pushing through pain.  pt agreed to decreasing amount of weight using in [employer] exercises classes.  Added 1# weights to supine and stadning exercises, and added x-v and w aarm exercises.  Pt had sligh increase pin shoulder discomfort with exercises, and verbalized that shoulder feels weak.   Educated pt on imoprance of strenghening shoulders gradually, rather than pushing through increased apin with increased weight and intensity.   OT Plan: Follow up on use of less weight in [employer] classes.  continue 1# in standing and supine. Increase to 12 reps if no increases in pain after previous session.   Goals Short Term Goals Short Term Goal 1: Patient will be educated on HEP.  Short Term Goal 1 Progress: Progressing toward goal Short Term Goal 2: Patient will decrease fascial restrictions from mod to min amount.  Short Term Goal 2 Progress: Progressing toward goal Short Term Goal 3: Patient will decrease pain to 4/10 during daily  tasks.  Short Term Goal 3 Progress: Progressing toward goal Long Term Goals Long Term Goal 1: Patient will return to highest level of independence during daily and work tasks.  Long Term Goal 1 Progress: Progressing toward goal Long Term Goal 2: Patient will decrease fascial restrictions from min to trace amounts or less.  Long Term Goal 2 Progress: Progressing toward goal Long Term Goal 3: Patient will decrease pain to 1/10 or less during daily tasks.  Long Term Goal 3 Progress: Progressing toward goal Long Term Goal 4: Patient will increase AROM to WNL to increase ability to reach for supplies in overhead cabinets.  Long Term Goal 4 Progress: Progressing  toward goal Long Term Goal 5: Patient will increase strength to 5/5 to increase ability to lift heavy objects during work tasks.  Long Term Goal  5 Progress: Progressing toward goal  Problem List Patient Active Problem List   Diagnosis Date Noted  . Pain in joint, shoulder region 12/14/2013  . Muscle weakness (generalized) 12/14/2013  . Decreased range of motion of right shoulder 12/14/2013    End of Session Activity Tolerance: Patient tolerated treatment well General Behavior During Therapy: [facility] for tasks assessed/performed  GO    Bea Graff Deandra Goering, MS, OTR/L [place] 12/30/2013, 5:22 PM

## 2014-01-03 ENCOUNTER — Inpatient Hospital Stay (HOSPITAL_COMMUNITY): Admission: RE | Admit: 2014-01-03 | Payer: 59 | Source: Ambulatory Visit

## 2014-01-06 ENCOUNTER — Ambulatory Visit (HOSPITAL_COMMUNITY): Payer: 59

## 2014-01-06 ENCOUNTER — Telehealth (HOSPITAL_COMMUNITY): Payer: Self-pay

## 2014-01-06 NOTE — Telephone Encounter (Signed)
cx - no reason given

## 2014-01-10 ENCOUNTER — Ambulatory Visit (HOSPITAL_COMMUNITY): Admission: RE | Admit: 2014-01-10 | Payer: 59 | Source: Ambulatory Visit

## 2014-01-13 ENCOUNTER — Inpatient Hospital Stay (HOSPITAL_COMMUNITY): Admission: RE | Admit: 2014-01-13 | Payer: 59 | Source: Ambulatory Visit

## 2014-01-28 ENCOUNTER — Encounter (HOSPITAL_COMMUNITY): Payer: Self-pay

## 2014-01-28 NOTE — Therapy (Signed)
  Patient Details  Name: TONJI ELLIFF MRN: [id number] Date of Birth: March 16, 1961  Encounter Date: 01/28/2014 OCCUPATIONAL THERAPY DISCHARGE SUMMARY  Visits from Start of Care: 5  Current functional level related to goals / functional outcomes: Long Term Goal 1: Patient will return to highest level of independence during daily and work tasks.  Long Term Goal 2: Patient will decrease fascial restrictions from min to trace amounts or less.  Long Term Goal 3: Patient will decrease pain to 1/10 or less during daily tasks.  Long Term Goal 4: Patient will increase AROM to WNL to increase ability to reach for supplies in overhead cabinets.  Long Term Goal 5: Patient will increase strength to 5/5 to increase ability to lift heavy objects during work tasks.     Remaining deficits: Patient is to be discharged from OT services due to failure to return to clinic since 12/30/13. Therapy goals were not met.    Education / Equipment: HEP - AROM exercises Plan:                                                    Patient goals were not met. Patient is being discharged due to not returning since the last visit.  ?????       Ailene Ravel, OTR/L,CBIS   01/28/2014, 10:37 AM

## 2014-02-26 ENCOUNTER — Encounter: Payer: Self-pay | Admitting: *Deleted

## 2014-03-03 ENCOUNTER — Encounter: Payer: Self-pay | Admitting: Family Medicine

## 2014-03-03 ENCOUNTER — Ambulatory Visit (INDEPENDENT_AMBULATORY_CARE_PROVIDER_SITE_OTHER): Payer: 59 | Admitting: Family Medicine

## 2014-03-03 VITALS — BP 132/84 | Ht 65.5 in | Wt 135.0 lb

## 2014-03-03 DIAGNOSIS — Z0000 Encounter for general adult medical examination without abnormal findings: Secondary | ICD-10-CM

## 2014-03-03 DIAGNOSIS — Z13220 Encounter for screening for lipoid disorders: Secondary | ICD-10-CM

## 2014-03-03 DIAGNOSIS — Z0189 Encounter for other specified special examinations: Secondary | ICD-10-CM

## 2014-03-03 DIAGNOSIS — Z139 Encounter for screening, unspecified: Secondary | ICD-10-CM

## 2014-03-03 NOTE — Progress Notes (Signed)
   Subjective:    Patient ID: Natalie Hess, female    DOB: Nov 08, 1960, 53 y.o.   MRN: [id number]  HPICheck up. Pt requesting screening bloodwork for wellness at work. Pt see gyn for physicals. But has not seen a GYN for over a year. Would like for today to be her yearly physical. Has not had screening blood work for quite some time.  Up-to-date on mammogram do another one soon.  Has turned 53 and has not had her colonoscopy yet.   Health conscious with diet.  Watching diet overall well, watching fatty stuff,  This time of yr, two days per wk  Quit smoking long time ago  Had thumb and shokd e arthrititis  Strong fam hx of arthritis   Exercises at the y 2 days a week and exercises at home.    Colon not yet     Review of Systems  Constitutional: Negative for activity change, appetite change and fatigue.  HENT: Negative for congestion, ear discharge and rhinorrhea.   Eyes: Negative for discharge.  Respiratory: Negative for cough, chest tightness and wheezing.   Cardiovascular: Negative for chest pain.  Gastrointestinal: Negative for vomiting and abdominal pain.  Genitourinary: Negative for frequency and difficulty urinating.  Musculoskeletal: Negative for neck pain.  Allergic/Immunologic: Negative for environmental allergies and food allergies.  Neurological: Negative for weakness and headaches.  Psychiatric/Behavioral: Negative for behavioral problems and agitation.  All other systems reviewed and are negative.      Objective:   Physical Exam  Constitutional: She is oriented to person, place, and time. She appears well-developed and well-nourished.  HENT:  Head: Normocephalic.  Right Ear: External ear normal.  Left Ear: External ear normal.  Eyes: Pupils are equal, round, and reactive to light.  Neck: Normal range of motion. No thyromegaly present.  Cardiovascular: Normal rate, regular rhythm, normal heart sounds and intact distal pulses.   No murmur  heard. Pulmonary/Chest: Effort normal and breath sounds normal. No respiratory distress. She has no wheezes.  Abdominal: Soft. Bowel sounds are normal. She exhibits no distension and no mass. There is no tenderness.  Musculoskeletal: Normal range of motion. She exhibits no edema or tenderness.  Lymphadenopathy:    She has no cervical adenopathy.  Neurological: She is alert and oriented to person, place, and time. She exhibits normal muscle tone.  Skin: Skin is warm and dry.  Psychiatric: She has a normal mood and affect. Her behavior is normal.  Vitals reviewed.         Assessment & Plan:  Impression #1 wellness exam. Patient declines breast exam GYN exam. #2 behind on appropriate colonoscopy discussed at length and strongly encourage. #3 arthritis managed with symptomatic care. Has seen Dr. Aline Brochure in past. Plan diet exercise discussed encourage. Appropriate blood work. Colonoscopy sheet given and recommendations given. WSL

## 2014-05-18 ENCOUNTER — Encounter: Payer: Self-pay | Admitting: Family Medicine

## 2014-05-18 LAB — HEPATIC FUNCTION PANEL
ALK PHOS: 59 U/L (ref 39–117)
ALT: 12 U/L (ref 0–35)
AST: 19 U/L (ref 0–37)
Albumin: 4.3 g/dL (ref 3.5–5.2)
BILIRUBIN DIRECT: 0.1 mg/dL (ref 0.0–0.3)
BILIRUBIN INDIRECT: 0.5 mg/dL (ref 0.2–1.2)
TOTAL PROTEIN: 6.4 g/dL (ref 6.0–8.3)
Total Bilirubin: 0.6 mg/dL (ref 0.2–1.2)

## 2014-05-18 LAB — BASIC METABOLIC PANEL
BUN: 10 mg/dL (ref 6–23)
CHLORIDE: 102 meq/L (ref 96–112)
CO2: 31 meq/L (ref 19–32)
Calcium: 9.3 mg/dL (ref 8.4–10.5)
Creat: 0.7 mg/dL (ref 0.50–1.10)
Glucose, Bld: 89 mg/dL (ref 70–99)
POTASSIUM: 4.3 meq/L (ref 3.5–5.3)
Sodium: 140 mEq/L (ref 135–145)

## 2014-05-18 LAB — LIPID PANEL
CHOL/HDL RATIO: 2 ratio
CHOLESTEROL: 205 mg/dL — AB (ref 0–200)
HDL: 104 mg/dL (ref >=46)
LDL CALC: 85 mg/dL (ref 0–99)
TRIGLYCERIDES: 80 mg/dL (ref <150)
VLDL: 16 mg/dL (ref 0–40)

## 2014-08-19 ENCOUNTER — Other Ambulatory Visit: Payer: Self-pay | Admitting: Orthopedic Surgery

## 2014-08-19 DIAGNOSIS — M5410 Radiculopathy, site unspecified: Secondary | ICD-10-CM

## 2014-08-19 MED ORDER — PREDNISONE 10 MG (48) PO TBPK: ORAL_TABLET | Freq: Every day | ORAL | Status: DC

## 2014-08-30 ENCOUNTER — Telehealth: Payer: Self-pay | Admitting: Obstetrics and Gynecology

## 2014-08-30 ENCOUNTER — Other Ambulatory Visit: Payer: Self-pay | Admitting: Obstetrics and Gynecology

## 2014-08-30 DIAGNOSIS — R232 Flushing: Secondary | ICD-10-CM | POA: Insufficient documentation

## 2014-08-30 MED ORDER — ESTRADIOL 1 MG PO TABS: 1.0000 mg | ORAL_TABLET | Freq: Every day | ORAL | Status: DC

## 2014-08-30 NOTE — Telephone Encounter (Signed)
Vasomotor symptoms interfering with sleep and work No hx dvt or stroke. Pt requests trial HT, has had NO prior ht use. Rx estrace 1 mg daily  appt in 1 months

## 2015-01-30 ENCOUNTER — Other Ambulatory Visit (INDEPENDENT_AMBULATORY_CARE_PROVIDER_SITE_OTHER): Payer: Self-pay | Admitting: Internal Medicine

## 2015-01-30 ENCOUNTER — Telehealth (INDEPENDENT_AMBULATORY_CARE_PROVIDER_SITE_OTHER): Payer: Self-pay | Admitting: *Deleted

## 2015-01-30 ENCOUNTER — Encounter (INDEPENDENT_AMBULATORY_CARE_PROVIDER_SITE_OTHER): Payer: Self-pay | Admitting: *Deleted

## 2015-01-30 DIAGNOSIS — Z1211 Encounter for screening for malignant neoplasm of colon: Secondary | ICD-10-CM

## 2015-01-30 NOTE — Telephone Encounter (Signed)
Referring MD/PCP: steve luking   Procedure: tcs  Reason/Indication:  screening  Has patient had this procedure before?  no  If so, when, by whom and where?    Is there a family history of colon cancer?  no  Who?  What age when diagnosed?    Is patient diabetic?   no      Does patient have prosthetic heart valve or mechanical valve?  no  Do you have a pacemaker?  no  Has patient ever had endocarditis? no  Has patient had joint replacement within last 12 months?  no  Does patient tend to be constipated or take laxatives? no  Does patient have a history of alcohol/drug use?  no  Is patient on Coumadin, Plavix and/or Aspirin? no  Medications: estradiol 1 mg 1/2 tab daily  Allergies: nkda  Medication Adjustment:   Procedure date & time: 02/17/15 at 930

## 2015-01-30 NOTE — Telephone Encounter (Signed)
AGREE

## 2015-02-17 ENCOUNTER — Ambulatory Visit (HOSPITAL_COMMUNITY)
Admission: RE | Admit: 2015-02-17 | Discharge: 2015-02-17 | Disposition: A | Discharge status: Home / Self Care | Payer: 59 | Source: Ambulatory Visit | Attending: Internal Medicine | Admitting: Internal Medicine

## 2015-02-17 ENCOUNTER — Encounter (HOSPITAL_COMMUNITY): Payer: Self-pay | Admitting: *Deleted

## 2015-02-17 ENCOUNTER — Encounter (HOSPITAL_COMMUNITY)
Admission: RE | Disposition: A | Discharge status: Home / Self Care | Payer: Self-pay | Source: Ambulatory Visit | Attending: Internal Medicine

## 2015-02-17 DIAGNOSIS — K648 Other hemorrhoids: Secondary | ICD-10-CM | POA: Diagnosis not present

## 2015-02-17 DIAGNOSIS — K644 Residual hemorrhoidal skin tags: Secondary | ICD-10-CM | POA: Diagnosis not present

## 2015-02-17 DIAGNOSIS — Z87891 Personal history of nicotine dependence: Secondary | ICD-10-CM | POA: Insufficient documentation

## 2015-02-17 DIAGNOSIS — Z1211 Encounter for screening for malignant neoplasm of colon: Secondary | ICD-10-CM | POA: Diagnosis not present

## 2015-02-17 HISTORY — DX: Nausea with vomiting, unspecified: Z98.890

## 2015-02-17 HISTORY — PX: COLONOSCOPY: SHX5424

## 2015-02-17 HISTORY — DX: Other specified postprocedural states: R11.2

## 2015-02-17 HISTORY — DX: Other specified postprocedural states: Z98.890

## 2015-02-17 HISTORY — DX: Other specified health status: Z78.9

## 2015-02-17 SURGERY — COLONOSCOPY
Anesthesia: Moderate Sedation

## 2015-02-17 MED ORDER — MIDAZOLAM HCL 5 MG/5ML IJ SOLN
INTRAMUSCULAR | Status: AC
  Filled 2015-02-17: qty 10

## 2015-02-17 MED ORDER — MEPERIDINE HCL 50 MG/ML IJ SOLN
INTRAMUSCULAR | Status: DC
Start: 2015-02-17 — End: 2015-02-17
  Filled 2015-02-17: qty 1

## 2015-02-17 MED ORDER — MIDAZOLAM HCL 5 MG/5ML IJ SOLN
INTRAMUSCULAR | Status: DC | PRN
  Administered 2015-02-17 (×2): 1 mg via INTRAVENOUS
  Administered 2015-02-17 (×4): 2 mg via INTRAVENOUS

## 2015-02-17 MED ORDER — STERILE WATER FOR IRRIGATION IR SOLN
Status: DC | PRN
  Administered 2015-02-17: 10:00:00

## 2015-02-17 MED ORDER — SODIUM CHLORIDE 0.9 % IV SOLN
INTRAVENOUS | Status: DC
  Administered 2015-02-17: 09:00:00 via INTRAVENOUS

## 2015-02-17 MED ORDER — MEPERIDINE HCL 50 MG/ML IJ SOLN
INTRAMUSCULAR | Status: DC | PRN
  Administered 2015-02-17 (×2): 25 mg via INTRAVENOUS

## 2015-02-17 NOTE — Discharge Instructions (Signed)
Resume usual medications and diet. °No driving for 24 hours. °Next screening exam in 10 years. ° ° ° ° ° °Colonoscopy, Care After °These instructions give you information on caring for yourself after your procedure. Your doctor may also give you more specific instructions. Call your doctor if you have any problems or questions after your procedure. °HOME CARE °· Do not drive for 24 hours. °· Do not sign important papers or use machinery for 24 hours. °· You may shower. °· You may go back to your usual activities, but go slower for the first 24 hours. °· Take rest breaks often during the first 24 hours. °· Walk around or use warm packs on your belly (abdomen) if you have belly cramping or gas. °· Drink enough fluids to keep your pee (urine) clear or pale yellow. °· Resume your normal diet. Avoid heavy or fried foods. °· Avoid drinking alcohol for 24 hours or as told by your doctor. °· Only take medicines as told by your doctor. °If a tissue sample (biopsy) was taken during the procedure:  °· Do not take aspirin or blood thinners for 7 days, or as told by your doctor. °· Do not drink alcohol for 7 days, or as told by your doctor. °· Eat soft foods for the first 24 hours. °GET HELP IF: °You still have a small amount of blood in your poop (stool) 2-3 days after the procedure. °GET HELP RIGHT AWAY IF: °· You have more than a small amount of blood in your poop. °· You see clumps of tissue (blood clots) in your poop. °· Your belly is puffy (swollen). °· You feel sick to your stomach (nauseous) or throw up (vomit). °· You have a fever. °· You have belly pain that gets worse and medicine does not help. °MAKE SURE YOU: °· Understand these instructions. °· Will watch your condition. °· Will get help right away if you are not doing well or get worse. °  °This information is not intended to replace advice given to you by your health care provider. Make sure you discuss any questions you have with your health care provider. °    °Document Released: 04/06/2010 Document Revised: 03/09/2013 Document Reviewed: 11/09/2012 °Elsevier Interactive Patient Education ©2016 [employer] ° °

## 2015-02-17 NOTE — Op Note (Signed)
COLONOSCOPY PROCEDURE REPORT  PATIENT:  Natalie Hess  MR#:  [id number] Birthdate:  Aug 06, 1960, 54 y.o., female Endoscopist:  Dr. Rogene Houston, MD Referred By:  Dr. Mickie Hillier, MD Procedure Date: 02/17/2015  Procedure:   Colonoscopy  Indications:   Average risk screening colonoscopy.  Informed Consent:  The procedure and risks were reviewed with the patient and informed consent was obtained.  Medications:  Demerol 50 mg IV Versed 10 mg IV  Description of procedure:  After a digital rectal exam was performed, that colonoscope was advanced from the anus through the rectum and colon to the area of the cecum, ileocecal valve and appendiceal orifice. The cecum was deeply intubated. These structures were well-seen and photographed for the record. From the level of the cecum and ileocecal valve, the scope was slowly and cautiously withdrawn. The mucosal surfaces were carefully surveyed utilizing scope tip to flexion to facilitate fold flattening as needed. The scope was pulled down into the rectum where a thorough exam including retroflexion was performed.  Findings:   Prep excellent. Normal mucosa of cecum, ascending colon, hepatic flexure, transverse colon , splenic flexure, descending and sigmoid colon. Normal rectal mucosa. Smalll hemorrhoids below the dentate line.   Therapeutic/Diagnostic Maneuvers Performed:  None  Complications:   none  EBL: None  Cecal Withdrawal Time:   11 minutes  Impression:   Examination performed to cecum.  Small external hemorrhoids otherwise normal colonoscopy  Recommendations:  Standard instructions given. Next screening exam in 10 years.  REHMAN,NAJEEB U  02/17/2015 10:14 AM  CC: Dr. Mickie Hillier, MD & Dr. Rayne Du ref. provider found

## 2015-02-17 NOTE — H&P (Signed)
Natalie Hess is an 54 y.o. female.   Chief Complaint:  Patient is here for colonoscopy. HPI:  Patient is 54 year old Caucasian female who is in for screening colonoscopy. She denies abdominal pain change in bowel habits or rectal bleeding.  Family history is negative for CRC.  Past Medical History  Diagnosis Date  . Medical history non-contributory   . PONV (postoperative nausea and vomiting)     Past Surgical History  Procedure Laterality Date  . Abdominal hysterectomy    . Cesarean section      X 2    History reviewed. No pertinent family history. Social History:  reports that she has quit smoking. She does not have any smokeless tobacco history on file. She reports that she drinks alcohol. She reports that she does not use illicit drugs.  Allergies: No Known Allergies  Medications Prior to Admission  Medication Sig Dispense Refill  . estradiol (ESTRACE) 1 MG tablet Take 1 tablet (1 mg total) by mouth daily. 30 tablet 11    No results found for this or any previous visit (from the past 48 hour(s)). No results found.  ROS  Blood pressure 138/80, pulse 65, temperature 97.5 F (36.4 C), temperature source Oral, resp. rate 17, height 5\' 7"  (1.702 m), weight 135 lb (61.236 kg), SpO2 100 %. Physical Exam  Constitutional: She appears well-developed and well-nourished.  HENT:  Mouth/Throat: Oropharynx is clear and moist.  Eyes: Conjunctivae are normal.  Neck: No thyromegaly present.  Cardiovascular: Normal rate, regular rhythm and normal heart sounds.   No murmur heard. Respiratory: Effort normal and breath sounds normal.  GI: Soft. She exhibits no distension and no mass. There is no tenderness.  Musculoskeletal: She exhibits no edema.  Lymphadenopathy:    She has no cervical adenopathy.  Neurological: She is alert.  Skin: Skin is warm and dry.     Assessment/Plan Average risk screening colonoscopy   Karlos Scadden U 02/17/2015, 9:36 AM

## 2015-02-20 ENCOUNTER — Encounter (HOSPITAL_COMMUNITY): Payer: Self-pay | Admitting: Internal Medicine

## 2015-03-27 ENCOUNTER — Ambulatory Visit (INDEPENDENT_AMBULATORY_CARE_PROVIDER_SITE_OTHER): Payer: 59 | Admitting: Family Medicine

## 2015-03-27 ENCOUNTER — Encounter: Payer: Self-pay | Admitting: Family Medicine

## 2015-03-27 VITALS — BP 118/82 | Temp 98.0°F | Ht 65.5 in | Wt 141.4 lb

## 2015-03-27 DIAGNOSIS — J329 Chronic sinusitis, unspecified: Secondary | ICD-10-CM

## 2015-03-27 DIAGNOSIS — J310 Chronic rhinitis: Secondary | ICD-10-CM

## 2015-03-27 MED ORDER — AMOXICILLIN-POT CLAVULANATE 875-125 MG PO TABS: 1.0000 | ORAL_TABLET | Freq: Two times a day (BID) | ORAL | Status: AC

## 2015-03-27 NOTE — Progress Notes (Signed)
   Subjective:    Patient ID: Natalie Hess, female    DOB: May 03, 1960, 55 y.o.   MRN: [id number]  Cough This is a new problem. The current episode started 1 to 4 weeks ago. Associated symptoms include headaches, nasal congestion and a sore throat. Treatments tried: z pack, mucinex, sudafed.    Started around ten d a go, achey drained  Non productive cough and gunky  Took a z pack  z pk, did not help much   Non smoker   Hi T rather sudden at the start of the illness. Has never Zithromax did not help.  Low-grade fever times achiness frontal headache comes and goes sharp in nature worse with positional change   Review of Systems  HENT: Positive for sore throat.   Respiratory: Positive for cough.   Neurological: Positive for headaches.       Objective:   Physical Exam  Alert, mild malaise. Hydration good Vitals stable. frontal/ maxillary tenderness evident positive nasal congestion. pharynx normal neck supple  lungs clear/no crackles or wheezes. heart regular in rhythm       Assessment & Plan:  Impression rhinosinusitis likely post viral, discussed with patient. plan antibiotics prescribed. Questions answered. Symptomatic care discussed. warning signs discussed. Augmentin prescribed to cover resistant antibiotics WSL

## 2015-07-06 ENCOUNTER — Ambulatory Visit (INDEPENDENT_AMBULATORY_CARE_PROVIDER_SITE_OTHER): Payer: 59 | Admitting: Orthopedic Surgery

## 2015-07-06 ENCOUNTER — Ambulatory Visit (INDEPENDENT_AMBULATORY_CARE_PROVIDER_SITE_OTHER): Payer: 59

## 2015-07-06 VITALS — BP 171/86 | HR 74 | Ht 65.5 in | Wt 135.8 lb

## 2015-07-06 DIAGNOSIS — M1990 Unspecified osteoarthritis, unspecified site: Secondary | ICD-10-CM

## 2015-07-06 DIAGNOSIS — M79644 Pain in right finger(s): Secondary | ICD-10-CM

## 2015-07-06 DIAGNOSIS — M19049 Primary osteoarthritis, unspecified hand: Secondary | ICD-10-CM

## 2015-07-06 MED ORDER — DICLOFENAC POTASSIUM 50 MG PO TABS: 50.0000 mg | ORAL_TABLET | Freq: Two times a day (BID) | ORAL | Status: DC

## 2015-07-06 NOTE — Progress Notes (Signed)
Patient ID: Natalie Hess, female   DOB: Nov 25, 1960, 55 y.o.   MRN: [id number]  Chief Complaint  Patient presents with  . New Patient (Initial Visit)    pain in right thumb and wrist    HPI Natalie Hess is a 55 y.o. female.  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   Past Medical History  Diagnosis Date  . Medical history non-contributory   . PONV (postoperative nausea and vomiting)     Past Surgical History  Procedure Laterality Date  . Abdominal hysterectomy    . Cesarean section      X 2  . Colonoscopy N/A 02/17/2015    Procedure: COLONOSCOPY;  Surgeon: Rogene Houston, MD;  Location: [facility];  Service: Endoscopy;  Laterality: N/A;  930   Social History Social History  Substance Use Topics  . Smoking status: Former Research scientist (life sciences)  . Smokeless tobacco: Not on file  . Alcohol Use: 0.0 oz/week    0 Standard drinks or equivalent per week     Comment: socially   No Known Allergies  Current Outpatient Prescriptions  Medication Sig Dispense Refill  . estradiol (ESTRACE) 1 MG tablet Take 1 tablet (1 mg total) by mouth daily. 30 tablet 11  . diclofenac (CATAFLAM) 50 MG tablet Take 1 tablet (50 mg total) by mouth 2 (two) times daily. 90 tablet 3   No current facility-administered medications for this visit.    Physical Exam  BP 171/86 mmHg  Pulse 74  Ht 5' 5.5" (1.664 m)  Wt 135 lb 12.8 oz (61.598 kg)  BMI 22.25 kg/m2  Physical Exam  Constitutional: She is oriented to person, place, and time. She appears well-developed and well-nourished. No distress.   Cardiovascular: Normal rate and intact distal pulses.   Musculoskeletal:  Comparison of the 2 thumbs right to left shows both are swollen right worse than left both are tender  She has a positive grind test tenderness over the joint painful range of motion she can fully oppose the thumb the skin overlying is intact the wrist is tender she has painful extension flexion of the wrist but no instability neurovascular exam is normal color is normal there is no edema and the rest of the hand epitrochlear lymph nodes are negative  There is no instability of the right or left thumb IP and PA or CMC joint  Neurological: She is alert and oriented to person, place, and time. She has normal reflexes. She exhibits normal muscle tone. Coordination normal.  Skin: Skin is warm and dry. No rash noted. She is not diaphoretic. No erythema. No pallor.  Psychiatric: She has a normal mood and affect. Her behavior is normal. Judgment and thought content normal.    Ortho Exam  Normal ambulation  Data Reviewed  I interpreted the x-ray after ordering as X-rays of the thumbs show CMC arthritis moderate no significant subluxation but joint space narrowing and secondary bone changes  Assessment  CMC arthritis   Plan  CMC joint splinting Injection  Procedure note  Injection CMC  joint right thumb  Verbal consent was obtained to inject the  [facility] joint right thumb  Timeout procedure was completed to confirm injection site  Diagnosis CMC arthritis  Medications used Depo-Medrol 40 mg 1 cc Lidocaine 1% plain 3 cc  Anesthesia was provided by ethyl chloride spray  Prep was performed with alcohol  Technique of injection  after skin preparation the injection was placed in the joint   No complications were noted   Follow-up 6 weeks Start diclofenac twice a day 50 mg

## 2015-07-06 NOTE — Patient Instructions (Signed)
Pick up medication at pharmacy 

## 2015-08-03 ENCOUNTER — Ambulatory Visit (INDEPENDENT_AMBULATORY_CARE_PROVIDER_SITE_OTHER): Payer: 59 | Admitting: Orthopedic Surgery

## 2015-08-03 VITALS — BP 147/90 | HR 72 | Ht 65.5 in | Wt 136.0 lb

## 2015-08-03 DIAGNOSIS — M1990 Unspecified osteoarthritis, unspecified site: Secondary | ICD-10-CM

## 2015-08-03 DIAGNOSIS — M19049 Primary osteoarthritis, unspecified hand: Secondary | ICD-10-CM

## 2015-08-04 NOTE — Progress Notes (Signed)
Patient ID: ARONDA WIEDERHOLT, female   DOB: 09-17-1960, 55 y.o.   MRN: [id number]  Chief Complaint  Patient presents with  . Follow-up    Right thumb s/p injection    HPI F/U S/P INJECTX RIGHT CMC JOINT; MUCH IMPROVED AFTER INJECTX AND DICLOFENAC 50 BID  ROS  LEFT CMC JOINT PAIN MILD AT PRESENT.  BP 147/90 mmHg  Pulse 72  Ht 5' 5.5" (1.664 m)  Wt 136 lb (61.689 kg)  BMI 22.28 kg/m2 Gen. appearance is normal grooming and hygiene Orientation to person place and time normal Mood normal Ortho Exam Gait is normal  swelling is noted in the RT AND LT HAND Sensory exam shows normal sensation to palpation, pressure and soft touch IN BOTH HANDS  Skin exam no lacerations ulcerations or erythema THE CMC JOINTS ARE TENDER    A/P  Medical decision-making  SHE HAS IMPROVED AND CAN RETURN AS NEEDED  TAKE DICLOFENAC ON AS NEEDED BASIS

## 2015-08-29 ENCOUNTER — Ambulatory Visit (INDEPENDENT_AMBULATORY_CARE_PROVIDER_SITE_OTHER): Payer: 59

## 2015-08-29 ENCOUNTER — Ambulatory Visit (INDEPENDENT_AMBULATORY_CARE_PROVIDER_SITE_OTHER): Payer: 59 | Admitting: Orthopedic Surgery

## 2015-08-29 ENCOUNTER — Encounter: Payer: Self-pay | Admitting: Orthopedic Surgery

## 2015-08-29 VITALS — BP 155/97 | Ht 64.5 in | Wt 135.6 lb

## 2015-08-29 DIAGNOSIS — M25511 Pain in right shoulder: Secondary | ICD-10-CM

## 2015-08-29 DIAGNOSIS — M75101 Unspecified rotator cuff tear or rupture of right shoulder, not specified as traumatic: Secondary | ICD-10-CM | POA: Diagnosis not present

## 2015-08-29 NOTE — Progress Notes (Signed)
Patient ID: Natalie Hess, female   DOB: February 14, 1961, 55 y.o.   MRN: [id number]  Chief Complaint  Patient presents with  . Shoulder Pain    RIGHT SHOULDER PAIN    HPI JERNEY Hess is a 54 y.o. female.  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 2 years ago she had an injection in the shoulder she's had home exercise program, physical therapy, chiropractic manipulation TENS unit and massage. She is taking Aleve and Advil on an as-needed basis. She continues to complain of 7 out of 8 constant pain sharp stabbing aching anterior joint line posterior joint line posterior subacromial space with some pain on the side of the neck but no midline cervical pain. She says her traps and parascapular regions feel tight.  She has painful motion with flexion with internal rotation with getting dressed and says it's hard to put her pants on area   HPI  Review of Systems Review of Systems Normal neuro  Denies fever   Past Medical History  Diagnosis Date  . Medical history non-contributory   . PONV (postoperative nausea and vomiting)     Past Surgical History  Procedure Laterality Date  . Abdominal hysterectomy    . Cesarean section      X 2  . Colonoscopy N/A 02/17/2015    Procedure: COLONOSCOPY;  Surgeon: Rogene Houston, MD;  Location: [facility];  Service: Endoscopy;  Laterality: N/A;  930     The patient was quizzed about their family history and reported no history of bleeding problems or anesthesia problems in their family  Social History Social History  Substance Use Topics  . Smoking status: Former Research scientist (life sciences)  . Smokeless tobacco: None  . Alcohol Use: 0.0 oz/week    0 Standard drinks or equivalent per week     Comment: socially    No Known Allergies  Current Outpatient Prescriptions  Medication Sig Dispense Refill  . diclofenac (CATAFLAM) 50 MG tablet Take 1 tablet (50 mg total) by mouth 2 (two) times daily.  90 tablet 3  . estradiol (ESTRACE) 1 MG tablet Take 1 tablet (1 mg total) by mouth daily. 30 tablet 11   No current facility-administered medications for this visit.     Physical Exam Blood pressure 155/97, height 5' 4.5" (1.638 m), weight 135 lb 9.6 oz (61.508 kg). Physical Exam The patient is well developed well nourished and well groomed.  Orientation to person place and time is normal  Mood is pleasant.  Ambulatory status Normal Cervical spine exam is as follows nontender midline cervical spine nontender left trapezius and periscapular region tender right trapezius and right supraspinatus fossa and right periscapular region no atrophy   Ortho Exam Right shoulder  Examination: Inspection reveals tenderness around the peri-acromial region. The patient has decreased range of motion and grade 4/ 5 motor function of the rotator cuff with empty can sign liftoff test is normal Stability in abduction external rotation is normal. No apprehension  Neurovascular examination is intact and the lymph nodes in the axilla and supraclavicular regions are normal   The opposite shoulder left exhibits normal range of motion stability and strength neurovascular exam is intact, lymph nodes are negative and there is no swelling or tenderness   Data Reviewed IMAGING: AP/LAT right Shoulder: I have read and interpret the x-ray as follows:   normal shoulder with type II acromion   Assessment    Right  SHOULDER  ROTATOR CUFF SYNDROME ROTATOR CUFF TEAR  SHOULDER PAIN     Plan    NSAIDS Aleve 1 tablet twice a day Therapy already completed INJECTION repeated MRI F/U after MRI   Procedure note the subacromial injection shoulder RIGHT  Verbal consent was obtained to inject the  RIGHT   Shoulder  Timeout was completed to confirm the injection site is a subacromial space of the  RIGHT  shoulder   Medication used Depo-Medrol 40 mg and lidocaine 1% 3 cc  Anesthesia was provided by ethyl  chloride  The injection was performed in the RIGHT  posterior subacromial space. After pinning the skin with alcohol and anesthetized the skin with ethyl chloride the subacromial space was injected using a 20-gauge needle. There were no complications  Sterile dressing was applied.    Arther Abbott, MD 08/29/2015 12:49 PM

## 2015-09-07 ENCOUNTER — Ambulatory Visit (HOSPITAL_COMMUNITY): Payer: 59

## 2015-09-20 ENCOUNTER — Other Ambulatory Visit: Payer: Self-pay | Admitting: Obstetrics & Gynecology

## 2015-09-20 MED ORDER — PREDNISONE 10 MG PO TABS: ORAL_TABLET | ORAL | Status: DC

## 2015-10-31 ENCOUNTER — Encounter: Payer: Self-pay | Admitting: Family Medicine

## 2015-10-31 ENCOUNTER — Ambulatory Visit (INDEPENDENT_AMBULATORY_CARE_PROVIDER_SITE_OTHER): Payer: 59 | Admitting: Family Medicine

## 2015-10-31 VITALS — BP 120/80 | Temp 97.6°F | Ht 65.5 in | Wt 137.0 lb

## 2015-10-31 DIAGNOSIS — R21 Rash and other nonspecific skin eruption: Secondary | ICD-10-CM

## 2015-10-31 NOTE — Progress Notes (Signed)
   Subjective:    Patient ID: Natalie Hess, female    DOB: [birth year], 55 y.o.   MRN: [id number]  HPI Patient in today for insect to back of right leg. Onset 6 weeks ago. Paitient states that location has grown since onset.   States no other concerns this visit. This originally started is probably a bite. Started is itchy spot. Since his thickened ingrown. Somewhat concerning the patient. No active discharge no rash elsewhere no fever or chills  Review of Systems No headache, no major weight loss or weight gain, no chest pain no back pain abdominal pain no change in bowel habits complete ROS otherwise negative     Objective:   Physical Exam  Alert vitals stable, NAD. Blood pressure good on repeat. HEENT normal. Lungs clear. Heart regular rate and rhythm. Right lateral ankle inflammatory papule no discharge no tenderness      Assessment & Plan:  Post bite inflammatory papule plan steroid cream twice a day to affected areas symptom care discussed WSL

## 2016-01-12 ENCOUNTER — Other Ambulatory Visit: Payer: Self-pay | Admitting: Orthopedic Surgery

## 2016-01-12 MED ORDER — AZITHROMYCIN 250 MG PO TABS: ORAL_TABLET | ORAL | 0 refills | Status: DC

## 2016-01-12 NOTE — Progress Notes (Signed)
Patient called c/o "the crud" upper resp congestion chest and sinuses  No all   Asked for z pack (has worked before)   Meds ordered this encounter  Medications  . azithromycin (ZITHROMAX Z-PAK) 250 MG tablet    Sig: 2 tabs day 1 Then 1 daily until finished    Dispense:  6 each    Refill:  0

## 2016-03-26 DIAGNOSIS — B078 Other viral warts: Secondary | ICD-10-CM | POA: Diagnosis not present

## 2016-03-26 DIAGNOSIS — L308 Other specified dermatitis: Secondary | ICD-10-CM | POA: Diagnosis not present

## 2016-04-15 ENCOUNTER — Telehealth: Payer: Self-pay | Admitting: *Deleted

## 2016-04-15 MED ORDER — ESTRADIOL 1 MG PO TABS: 1.0000 mg | ORAL_TABLET | Freq: Every day | ORAL | 11 refills | Status: DC

## 2016-04-15 NOTE — Telephone Encounter (Signed)
Estradiol 1mg  called in x 1 yr.

## 2016-04-25 ENCOUNTER — Ambulatory Visit (INDEPENDENT_AMBULATORY_CARE_PROVIDER_SITE_OTHER): Payer: 59

## 2016-04-25 ENCOUNTER — Encounter: Payer: Self-pay | Admitting: Orthopaedic Surgery

## 2016-04-25 ENCOUNTER — Ambulatory Visit (INDEPENDENT_AMBULATORY_CARE_PROVIDER_SITE_OTHER): Payer: 59 | Admitting: Orthopaedic Surgery

## 2016-04-25 VITALS — BP 148/89 | HR 83 | Temp 97.2°F | Ht 65.5 in | Wt 140.0 lb

## 2016-04-25 DIAGNOSIS — R0781 Pleurodynia: Secondary | ICD-10-CM | POA: Diagnosis not present

## 2016-04-25 DIAGNOSIS — S2241XA Multiple fractures of ribs, right side, initial encounter for closed fracture: Secondary | ICD-10-CM

## 2016-04-25 MED ORDER — OXYCODONE-ACETAMINOPHEN 7.5-325 MG PO TABS: ORAL_TABLET | ORAL | 0 refills | Status: DC

## 2016-04-25 NOTE — Progress Notes (Signed)
Patient [id number] Natalie Hess, female DOB:04/12/1960, 56 y.o. [id number]  Chief Complaint  Patient presents with  . Rib Injury    RIB INJURY, 04/21/16    HPI  Natalie Hess is a 56 y.o. female who fell on February 4th against a pallet and hurt her right rib cage and right side.  She has had pain and pain when coughing of the chest.  She works in the operating room.  Her supervisor called and asked if I could see her now.  I agreed.  She has no shortness of breath but it does hurt when she takes a deep breath.  She has used ice and Advil and avoided lifting objects. HPI  Body mass index is 22.94 kg/m.  ROS  Review of Systems  HENT: Negative for congestion.   Respiratory: Negative for cough and shortness of breath.   Cardiovascular: Negative for chest pain and leg swelling.  Endocrine: Positive for cold intolerance.  Musculoskeletal: Positive for arthralgias.  Allergic/Immunologic: Positive for environmental allergies.    Past Medical History:  Diagnosis Date  . Medical history non-contributory   . PONV (postoperative nausea and vomiting)     Past Surgical History:  Procedure Laterality Date  . ABDOMINAL HYSTERECTOMY    . CESAREAN SECTION     X 2  . COLONOSCOPY N/A 02/17/2015   Procedure: COLONOSCOPY;  Surgeon: Rogene Houston, MD;  Location: [facility];  Service: Endoscopy;  Laterality: N/A;  930    No family history on file.  Social History Social History  Substance Use Topics  . Smoking status: Former Research scientist (life sciences)  . Smokeless tobacco: Never Used  . Alcohol use 0.0 oz/week     Comment: socially    No Known Allergies  Current Outpatient Prescriptions  Medication Sig Dispense Refill  . azithromycin (ZITHROMAX Z-PAK) 250 MG tablet 2 tabs day 1 Then 1 daily until finished 6 each 0  . diclofenac (CATAFLAM) 50 MG tablet Take 1 tablet (50 mg total) by mouth 2 (two) times daily. (Patient not taking: Reported on 10/31/2015) 90 tablet 3  . estradiol (ESTRACE) 1 MG tablet Take 1  tablet (1 mg total) by mouth daily. 30 tablet 11  . oxyCODONE-acetaminophen (PERCOCET) 7.5-325 MG tablet One tablet every four hours as needed for acute pain.  Five day limit consistent with [place] Stature. 30 tablet 0  . predniSONE (DELTASONE) 10 MG tablet Take 4 tablets daily for 10 days (Patient not taking: Reported on 10/31/2015) 40 tablet 0   No current facility-administered medications for this visit.      Physical Exam  Blood pressure (!) 148/89, pulse 83, temperature 97.2 F (36.2 C), height 5' 5.5" (1.664 m), weight 140 lb (63.5 kg).  Constitutional: overall normal hygiene, normal nutrition, well developed, normal grooming, normal body habitus. Assistive device:none  Musculoskeletal: gait and station Limp none, muscle tone and strength are normal, no tremors or atrophy is present.  .  Neurological: coordination overall normal.  Deep tendon reflex/nerve stretch intact.  Sensation normal.  Cranial nerves II-XII intact.   Skin:   Normal overall no scars, lesions, ulcers or rashes. No psoriasis.  Psychiatric: Alert and oriented x 3.  Recent memory intact, remote memory unclear.  Normal mood and affect. Well groomed.  Good eye contact.  Cardiovascular: overall no swelling, no varicosities, no edema bilaterally, normal temperatures of the legs and arms, no clubbing, cyanosis and good capillary refill.  Lymphatic: palpation is normal.  She is tender over the right lateral rib cage.  Breath sounds are normal.  She has no crepitus.  NV intact.  The patient has been educated about the nature of the problem(s) and counseled on treatment options.  The patient appeared to understand what I have discussed and is in agreement with it.  Encounter Diagnoses  Name Primary?  . Rib pain on right side Yes  . Closed fracture of three ribs of right side, initial encounter    X-rays were done of the rib cage, reported separately.  PLAN Call if any problems.  Precautions discussed.  Continue  current medications.   Return to clinic 1 week   I have reviewed the [place] web site prior to prescribing narcotic medicine for this patient.  Work restrictions given.  Electronically Signed Sanjuana Kava, MD 2/8/20182:56 PM

## 2016-04-25 NOTE — Patient Instructions (Signed)
May return to work, no lifting

## 2016-05-02 ENCOUNTER — Ambulatory Visit (INDEPENDENT_AMBULATORY_CARE_PROVIDER_SITE_OTHER): Payer: 59 | Admitting: Orthopaedic Surgery

## 2016-05-02 ENCOUNTER — Encounter: Payer: Self-pay | Admitting: Orthopaedic Surgery

## 2016-05-02 VITALS — BP 124/81 | HR 81 | Temp 97.7°F | Ht 65.5 in | Wt 139.0 lb

## 2016-05-02 DIAGNOSIS — S2241XA Multiple fractures of ribs, right side, initial encounter for closed fracture: Secondary | ICD-10-CM

## 2016-05-02 DIAGNOSIS — R0781 Pleurodynia: Secondary | ICD-10-CM | POA: Diagnosis not present

## 2016-05-02 NOTE — Progress Notes (Signed)
Patient Natalie Hess Fredrik Cove, female DOB:[birth year], 56 y.o. [id number]  Chief Complaint  Patient presents with  . Follow-up    rib pain    HPI  Natalie Hess is a 56 y.o. female who has fracture right rib mid rib cage.  She is much improved.  She has some pain if she turns wrong or coughs.  She has been working.  I went over precautions with her.  She has no shortness of breath. HPI  Body mass index is 22.78 kg/m.  ROS  Review of Systems  HENT: Negative for congestion.   Respiratory: Negative for cough and shortness of breath.   Cardiovascular: Negative for chest pain and leg swelling.  Endocrine: Positive for cold intolerance.  Musculoskeletal: Positive for arthralgias.  Allergic/Immunologic: Positive for environmental allergies.    Past Medical History:  Diagnosis Date  . Medical history non-contributory   . PONV (postoperative nausea and vomiting)     Past Surgical History:  Procedure Laterality Date  . ABDOMINAL HYSTERECTOMY    . CESAREAN SECTION     X 2  . COLONOSCOPY N/A 02/17/2015   Procedure: COLONOSCOPY;  Surgeon: Rogene Houston, MD;  Location: [facility];  Service: Endoscopy;  Laterality: N/A;  930    No family history on file.  Social History Social History  Substance Use Topics  . Smoking status: Former Research scientist (life sciences)  . Smokeless tobacco: Never Used  . Alcohol use 0.0 oz/week     Comment: socially    No Known Allergies  Current Outpatient Prescriptions  Medication Sig Dispense Refill  . azithromycin (ZITHROMAX Z-PAK) 250 MG tablet 2 tabs day 1 Then 1 daily until finished 6 each 0  . diclofenac (CATAFLAM) 50 MG tablet Take 1 tablet (50 mg total) by mouth 2 (two) times daily. (Patient not taking: Reported on 10/31/2015) 90 tablet 3  . estradiol (ESTRACE) 1 MG tablet Take 1 tablet (1 mg total) by mouth daily. 30 tablet 11  . oxyCODONE-acetaminophen (PERCOCET) 7.5-325 MG tablet One tablet every four hours as needed for acute pain.  Five day limit consistent  with [place] Stature. 30 tablet 0  . predniSONE (DELTASONE) 10 MG tablet Take 4 tablets daily for 10 days (Patient not taking: Reported on 10/31/2015) 40 tablet 0   No current facility-administered medications for this visit.      Physical Exam  Blood pressure 124/81, pulse 81, temperature 97.7 F (36.5 C), height 5' 5.5" (1.664 m), weight 139 lb (63 kg).  Constitutional: overall normal hygiene, normal nutrition, well developed, normal grooming, normal body habitus. Assistive device:none  Musculoskeletal: gait and station Limp none, muscle tone and strength are normal, no tremors or atrophy is present.  .  Neurological: coordination overall normal.  Deep tendon reflex/nerve stretch intact.  Sensation normal.  Cranial nerves II-XII intact.   Skin:   Normal overall no scars, lesions, ulcers or rashes. No psoriasis.  Psychiatric: Alert and oriented x 3.  Recent memory intact, remote memory unclear.  Normal mood and affect. Well groomed.  Good eye contact.  Cardiovascular: overall no swelling, no varicosities, no edema bilaterally, normal temperatures of the legs and arms, no clubbing, cyanosis and good capillary refill.  Lymphatic: palpation is normal.  She has right sided rib cage tenderness.  She has normal breathing.  The patient has been educated about the nature of the problem(s) and counseled on treatment options.  The patient appeared to understand what I have discussed and is in agreement with it.  Encounter Diagnoses  Name Primary?  . Rib pain on right side   . Closed fracture of three ribs of right side, initial encounter Yes    PLAN Call if any problems.  Precautions discussed.  Continue current medications.   Return to clinic 1 month   Electronically Signed Sanjuana Kava, MD 2/15/20183:00 PM

## 2017-01-01 ENCOUNTER — Encounter (HOSPITAL_COMMUNITY): Payer: Self-pay | Admitting: Emergency Medicine

## 2017-01-01 ENCOUNTER — Emergency Department (HOSPITAL_COMMUNITY)
Admission: EM | Admit: 2017-01-01 | Discharge: 2017-01-01 | Disposition: A | Discharge status: Home / Self Care | Payer: PRIVATE HEALTH INSURANCE | Attending: Emergency Medicine | Admitting: Emergency Medicine

## 2017-01-01 DIAGNOSIS — Z87891 Personal history of nicotine dependence: Secondary | ICD-10-CM | POA: Diagnosis not present

## 2017-01-01 DIAGNOSIS — T592X1A Toxic effect of formaldehyde, accidental (unintentional), initial encounter: Secondary | ICD-10-CM | POA: Insufficient documentation

## 2017-01-01 DIAGNOSIS — H5710 Ocular pain, unspecified eye: Secondary | ICD-10-CM | POA: Insufficient documentation

## 2017-01-01 DIAGNOSIS — Z79899 Other long term (current) drug therapy: Secondary | ICD-10-CM | POA: Diagnosis not present

## 2017-01-01 DIAGNOSIS — Z77098 Contact with and (suspected) exposure to other hazardous, chiefly nonmedicinal, chemicals: Secondary | ICD-10-CM

## 2017-01-01 NOTE — ED Triage Notes (Signed)
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 Initially c/o burning to eyes.  Pt states she feels relief since decon shower. No c/o at present. nad noted.

## 2017-01-01 NOTE — ED Provider Notes (Signed)
[facility] EMERGENCY DEPARTMENT Provider Note   CSN: [id number] Arrival date & time: 01/01/17  1028     History   Chief Complaint Chief Complaint  Patient presents with  . Chemical Exposure    HPI Natalie Hess is a 56 y.o. female.  HPI  56 year old female presenting for evaluation of exposure to formaldehyde.  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  During the surgery, a large formaldehyde bottle with specimen fell to the ground and approximately 465ml of liquid were exposed to the air.  It was approximately 30 minutes of inhalation chemical exposure before the OR patient was safely extubated and the staff can leave the room.  She initially complaining of burning sensation to eyes.  After taking decon shower she report improvement of sxs.  No hx of lung disease.    Past Medical History:  Diagnosis Date  . Medical history non-contributory   . PONV (postoperative nausea and vomiting)     Patient Active Problem List   Diagnosis Date Noted  . Vasomotor flushing 08/30/2014  . Pain in joint, shoulder region 12/14/2013  . Muscle weakness (generalized) 12/14/2013  . Decreased range of motion of right shoulder 12/14/2013    Past Surgical History:  Procedure Laterality Date  . ABDOMINAL HYSTERECTOMY    . CESAREAN SECTION     X 2  . COLONOSCOPY N/A 02/17/2015   Procedure: COLONOSCOPY;  Surgeon: Rogene Houston, MD;  Location[facility];  Service: Endoscopy;  Laterality: N/A;  930    OB History    No data available       Home Medications    Prior to Admission medications   Medication Sig Start Date End Date Taking? Authorizing Provider  azithromycin (ZITHROMAX Z-PAK) 250 MG tablet 2 tabs day 1 Then 1 daily until finished 01/12/16   Carole Civil, MD  diclofenac (CATAFLAM) 50 MG tablet Take 1 tablet (50 mg total) by mouth 2 (two) times daily. Patient not taking: Reported on 10/31/2015 07/06/15   Carole Civil, MD   estradiol (ESTRACE) 1 MG tablet Take 1 tablet (1 mg total) by mouth daily. 04/15/16 04/15/17  Jonnie Kind, MD  oxyCODONE-acetaminophen (PERCOCET) 7.5-325 MG tablet One tablet every four hours as needed for acute pain.  Five day limit consistent with [place] Stature. 04/25/16   Sanjuana Kava, MD  predniSONE (DELTASONE) 10 MG tablet Take 4 tablets daily for 10 days Patient not taking: Reported on 10/31/2015 09/20/15   Florian Buff, MD    Family History No family history on file.  Social History Social History  Substance Use Topics  . Smoking status: Former Research scientist (life sciences)  . Smokeless tobacco: Never Used  . Alcohol use 0.0 oz/week     Comment: socially     Allergies   Patient has no known allergies.   Review of Systems Review of Systems  All other systems reviewed and are negative.    Physical Exam Updated Vital Signs BP (!) 151/86 (BP Location: Left Arm)   Pulse 70   Temp 97.7 F (36.5 C) (Oral)   Resp 18   SpO2 98%   Physical Exam  Constitutional: She is oriented to person, place, and time. She appears well-developed and well-nourished. No distress.  HENT:  Head: Atraumatic.  Nose: Nose normal.  Mouth/Throat: Oropharynx is clear and moist.  Eyes: Pupils are equal, round, and reactive to light. Conjunctivae and EOM are normal.  Neck: Neck supple.  Cardiovascular: Normal rate and regular rhythm.   Pulmonary/Chest: Effort normal and breath sounds normal. She has no wheezes.  Neurological: She is alert and oriented to person, place, and time.  Skin: No rash noted.  Psychiatric: She has a normal mood and affect.  Nursing note and vitals reviewed.    ED Treatments / Results  Labs (all labs ordered are listed, but only abnormal results are displayed) Labs Reviewed - No data to display  EKG  EKG Interpretation None       Radiology No results found.  Procedures Procedures (including critical care time)  Medications Ordered in ED Medications - No data to  display   Initial Impression / Assessment and Plan / ED Course  I have reviewed the triage vital signs and the nursing notes.  Pertinent labs & imaging results that were available during my care of the patient were reviewed by me and considered in my medical decision making (see chart for details).    BP (!) 151/86 (BP Location: Left Arm)   Pulse 70   Temp 97.7 F (36.5 C) (Oral)   Resp 18   SpO2 98%    Final Clinical Impressions(s) / ED Diagnoses   Final diagnoses:  Exposure to chemical inhalation    New Prescriptions New Prescriptions   No medications on file   11:31 AM Pt here for inhalation exposure to formaldehyde while working in the OR today.  Approximately 30 minutes of exposure time. Supportive measures including removing of contaminated clothing, and the skin is thoroughly washed.  An [place] have been set up to care for the staff with exposures.    Pt is currently sxs free.  She has been monitored for the past 1-2 hrs.  Felt stable for discharge.  Return precaution given.    Domenic Moras, PA-C 01/01/17 1135    Pattricia Boss, MD 01/01/17 [phone]

## 2017-01-21 DIAGNOSIS — L570 Actinic keratosis: Secondary | ICD-10-CM | POA: Diagnosis not present

## 2017-01-21 DIAGNOSIS — L818 Other specified disorders of pigmentation: Secondary | ICD-10-CM | POA: Diagnosis not present

## 2017-01-21 DIAGNOSIS — X32XXXA Exposure to sunlight, initial encounter: Secondary | ICD-10-CM | POA: Diagnosis not present

## 2017-04-03 ENCOUNTER — Ambulatory Visit (INDEPENDENT_AMBULATORY_CARE_PROVIDER_SITE_OTHER): Payer: 59 | Admitting: Family Medicine

## 2017-04-03 ENCOUNTER — Encounter: Payer: Self-pay | Admitting: Family Medicine

## 2017-04-03 VITALS — BP 162/94 | Temp 97.6°F | Ht 65.5 in | Wt 138.1 lb

## 2017-04-03 DIAGNOSIS — J329 Chronic sinusitis, unspecified: Secondary | ICD-10-CM | POA: Diagnosis not present

## 2017-04-03 MED ORDER — HYDROCODONE-HOMATROPINE 5-1.5 MG/5ML PO SYRP: ORAL_SOLUTION | ORAL | 0 refills | Status: DC

## 2017-04-03 MED ORDER — AMOXICILLIN-POT CLAVULANATE 875-125 MG PO TABS: 1.0000 | ORAL_TABLET | Freq: Two times a day (BID) | ORAL | 0 refills | Status: DC

## 2017-04-03 NOTE — Progress Notes (Signed)
   Subjective:    Patient ID: Natalie Hess, female    DOB: June 25, 1960, 57 y.o.   MRN: [id number]  HPI Patient is here today with complaints of sinus pressure,runny nose,nonproductive cough,sneezing,sore throat.Not able to get rid of this as she has had this since right after Christmas.She has been taking mucinex,alkaselter cold,sudafed.   Gradual admae of runny nose and stuffiness  Sig pain for coughing and sneezing  'took a fall and stuck chest to piec of metal from the ground and struck rib      Review of Systems No headache, no major weight loss or weight gain, no chest pain no back pain abdominal pain no change in bowel habits complete ROS otherwise negative     Objective:   Physical Exam  Alert, mild malaise. Hydration good Vitals stable. frontal/ maxillary tenderness evident positive nasal congestion. pharynx normal neck supple  lungs clear/no crackles or wheezes. heart regular in rhythm       Assessment & Plan:  Impression rhinosinusitis likely post viral, discussed with patient. plan antibiotics prescribed. Questions answered. Symptomatic care discussed. warning signs discussed. WSL

## 2017-04-08 ENCOUNTER — Telehealth: Payer: Self-pay | Admitting: Family Medicine

## 2017-04-08 MED ORDER — CEFDINIR 300 MG PO CAPS: 300.0000 mg | ORAL_CAPSULE | Freq: Two times a day (BID) | ORAL | 0 refills | Status: AC

## 2017-04-08 NOTE — Telephone Encounter (Signed)
Pt states she is still taking the Amoxicillin 875,but thought by now she would be much better. She states the congestion is breaking up some,but she is still having a productive cough,and chest congestion,no fever.Wants to know if you think she needs something else as she still has a few days left of the amoxicillin.Please advise.

## 2017-04-08 NOTE — Telephone Encounter (Signed)
New rx cancelled and pt is aware to stop it.Aware new rx sent in to [facility].

## 2017-04-08 NOTE — Telephone Encounter (Signed)
Patient seen Dr. Richardson Landry on 04/03/17 for rhinosinusitis.  She said that she doesn't know if the Augmentin is working that she was prescribed.  I asked her what symptoms she still had and she said "all of the same symptoms".  She wants to know if Dr. Richardson Landry thinks she should change the antibiotic?   [facility]

## 2017-04-08 NOTE — Telephone Encounter (Signed)
Stop aug start omnicef 300 bid ten d

## 2017-04-21 ENCOUNTER — Ambulatory Visit (INDEPENDENT_AMBULATORY_CARE_PROVIDER_SITE_OTHER): Payer: 59 | Admitting: Orthopedic Surgery

## 2017-04-21 ENCOUNTER — Telehealth: Payer: Self-pay | Admitting: Radiology

## 2017-04-21 ENCOUNTER — Encounter: Payer: Self-pay | Admitting: Orthopedic Surgery

## 2017-04-21 ENCOUNTER — Ambulatory Visit (INDEPENDENT_AMBULATORY_CARE_PROVIDER_SITE_OTHER): Payer: 59

## 2017-04-21 VITALS — BP 167/116 | HR 85 | Ht 67.0 in | Wt 134.0 lb

## 2017-04-21 DIAGNOSIS — M25561 Pain in right knee: Secondary | ICD-10-CM

## 2017-04-21 DIAGNOSIS — S83511A Sprain of anterior cruciate ligament of right knee, initial encounter: Secondary | ICD-10-CM

## 2017-04-21 NOTE — Progress Notes (Signed)
Progress Note NEW PROLEM  Patient ID: Natalie Hess, female   DOB: 20-Dec-1960, 57 y.o.   MRN: [id number]  Chief Complaint  Patient presents with  . Knee Injury    Right knee pain, DOI 04-20-17.    57 year old female was playing on the trampoline and injured her right knee comes in for evaluation.  She complains of feeling a pop in a hyperextension type injury immediate swelling dull constant pain trouble weightbearing trouble bending her knee.     Review of Systems  Musculoskeletal: Positive for joint pain.  Skin: Negative.   Neurological: Negative for tingling.   No outpatient medications have been marked as taking for the 04/21/17 encounter (Office Visit) with Carole Civil, MD.    Past Medical History:  Diagnosis Date  . Medical history non-contributory   . PONV (postoperative nausea and vomiting)      No Known Allergies  BP (!) 167/116   Pulse 85   Ht 5\' 7"  (1.702 m)   Wt 134 lb (60.8 kg)   BMI 20.99 kg/m    Physical Exam  Constitutional: She is oriented to person, place, and time. She appears well-developed and well-nourished.  Neurological: She is alert and oriented to person, place, and time.  Psychiatric: She has a normal mood and affect. Judgment normal.  Vitals reviewed.   Ortho Exam She has painful antalgic gait  Left knee looks good full range of motion ligament stable normal ACL test strength normal excellent range of motion no tenderness or swelling skin is warm dry and intact pulses are good temperature is normal no edema normal sensation  Right knee shows a tense effusion 90 degrees of flexion unstable Lockman test normal muscle tone skin is intact no rash pulse and perfusion normal normal sensation  Medical decision-making  Imaging: X-ray shows possible effusion no fracture see dictated report  Encounter Diagnoses  Name Primary?  . Acute pain of right knee   . Rupture of anterior cruciate ligament of right knee, initial encounter Yes     ACL feels ruptured.  I recommended aspiration right knee: 42 CC OF BLOOD   Aspiration: consent was given timeout was taken to confirm right knee for aspiration and I aspirated   Recommend ice ibuprofen brace and MRI right knee     Arther Abbott, MD 04/21/2017 11:47 AM

## 2017-04-21 NOTE — Patient Instructions (Addendum)
Ice pack 3 times a day 20 minutes  Ace wrap and brace  Ibuprofen 803 times a day  MRI scan Wed Feb 6th arrive at 5:45 for 6 pm scan [facility]

## 2017-04-21 NOTE — Telephone Encounter (Signed)
I spoke to patient/ work note provided, clarified time of MRI with her on Wed and follow up with Dr Aline Brochure on Friday.

## 2017-04-21 NOTE — Addendum Note (Signed)
Addended byCandice Camp on: 04/21/2017 12:13 PM   Modules accepted: Orders

## 2017-04-22 ENCOUNTER — Encounter: Payer: Self-pay | Admitting: Orthopedic Surgery

## 2017-04-23 ENCOUNTER — Telehealth: Payer: Self-pay | Admitting: Radiology

## 2017-04-23 ENCOUNTER — Ambulatory Visit (HOSPITAL_COMMUNITY)
Admission: RE | Admit: 2017-04-23 | Discharge: 2017-04-23 | Disposition: A | Discharge status: Home / Self Care | Payer: 59 | Source: Ambulatory Visit | Attending: Orthopedic Surgery | Admitting: Orthopedic Surgery

## 2017-04-23 ENCOUNTER — Other Ambulatory Visit: Payer: Self-pay | Admitting: Orthopedic Surgery

## 2017-04-23 DIAGNOSIS — S83241A Other tear of medial meniscus, current injury, right knee, initial encounter: Secondary | ICD-10-CM | POA: Diagnosis not present

## 2017-04-23 DIAGNOSIS — M25561 Pain in right knee: Secondary | ICD-10-CM | POA: Diagnosis not present

## 2017-04-23 DIAGNOSIS — S83511A Sprain of anterior cruciate ligament of right knee, initial encounter: Secondary | ICD-10-CM | POA: Insufficient documentation

## 2017-04-23 DIAGNOSIS — M1711 Unilateral primary osteoarthritis, right knee: Secondary | ICD-10-CM | POA: Diagnosis not present

## 2017-04-23 DIAGNOSIS — X58XXXA Exposure to other specified factors, initial encounter: Secondary | ICD-10-CM | POA: Insufficient documentation

## 2017-04-23 NOTE — Telephone Encounter (Signed)
Called [phone] spoke to [place] about the prior auth for surgery, she has made note of the call for documentation, states no authorization is required and to use her name and today's date for the reference number for the call.

## 2017-04-23 NOTE — Telephone Encounter (Signed)
Dr Aline Brochure cancelled her appt for Friday, he is operating on her tomorrow, will you please cancel out the appointment?

## 2017-04-23 NOTE — H&P (Signed)
Outpatient surgery admission history   Patient ID: Natalie Hess, female   DOB: 11/12/1960, 57 y.o.   MRN: [id number]   Chief Complaint  Patient presents with  . Knee Injury      Right knee pain, DOI 04-20-17.      57 year old female was playing on the trampoline and injured her right knee comes in for evaluation.   She complains of feeling a pop in a hyperextension type injury immediate swelling dull constant pain trouble weightbearing trouble bending her knee.         Review of Systems  Musculoskeletal: Positive for joint pain.  Skin: Negative.   Neurological: Negative for tingling.    Active Medications  No outpatient medications have been marked as taking for the 04/21/17 encounter (Office Visit) with Carole Civil, MD.      Past Surgical History:  Procedure Laterality Date  . ABDOMINAL HYSTERECTOMY    . CESAREAN SECTION     X 2  . COLONOSCOPY N/A 02/17/2015   Procedure: COLONOSCOPY;  Surgeon: Rogene Houston, MD;  Location[facility];  Service: Endoscopy;  Laterality: N/A;  930   No family history on file. No family history of hypertension or diabetes recorded       Past Medical History:  Diagnosis Date  . Medical history non-contributory    . PONV (postoperative nausea and vomiting)        Social History   Tobacco Use  . Smoking status: Former Research scientist (life sciences)  . Smokeless tobacco: Never Used  Substance Use Topics  . Alcohol use: Yes    Alcohol/week: 0.0 oz    Comment: socially  . Drug use: No      No Known Allergies   BP (!) 167/116   Pulse 85   Ht 5\' 7"  (1.702 m)   Wt 134 lb (60.8 kg)   BMI 20.99 kg/m      Physical Exam  Constitutional: She is oriented to person, place, and time. She appears well-developed and well-nourished.  Neurological: She is alert and oriented to person, place, and time.  Psychiatric: She has a normal mood and affect. Judgment normal.  Vitals reviewed.     Ortho Exam She has painful antalgic gait   Left knee looks  good full range of motion ligament stable normal ACL test strength normal excellent range of motion no tenderness or swelling skin is warm dry and intact pulses are good temperature is normal no edema normal sensation   Right knee shows a tense effusion 90 degrees of flexion unstable Lockman test normal muscle tone skin is intact no rash pulse and perfusion normal normal sensation   Medical decision-making   Imaging: X-ray shows possible effusion no fracture see dictated report       Encounter Diagnoses  Name Primary?  . Acute pain of right knee    . Rupture of anterior cruciate ligament of right knee, initial encounter Yes      ACL feels ruptured.  I recommended aspiration right knee: 42 CC OF BLOOD    Aspiration: consent was given timeout was taken to confirm right knee for aspiration and I aspirated     MRI shows a torn medial meniscus posterior horn horizontal bone contusions in the tibia and femur characteristic of torn ACL which is also seen on MRI  After talking with the patient about her options of ACL reconstruction versus arthroscopy meniscectomy ACL debridement rehab and bracing she is opted for arthroscopy right knee partial medial meniscectomy debridement  of ACL postop rehab and bracing

## 2017-04-24 ENCOUNTER — Ambulatory Visit (HOSPITAL_COMMUNITY)
Admission: RE | Admit: 2017-04-24 | Discharge: 2017-04-24 | Disposition: A | Discharge status: Home / Self Care | Payer: 59 | Source: Ambulatory Visit | Attending: Orthopedic Surgery | Admitting: Orthopedic Surgery

## 2017-04-24 ENCOUNTER — Encounter (HOSPITAL_COMMUNITY): Payer: Self-pay | Admitting: Anesthesiology

## 2017-04-24 ENCOUNTER — Ambulatory Visit (HOSPITAL_COMMUNITY): Payer: 59 | Admitting: Anesthesiology

## 2017-04-24 ENCOUNTER — Encounter (HOSPITAL_COMMUNITY)
Admission: RE | Disposition: A | Discharge status: Home / Self Care | Payer: Self-pay | Source: Ambulatory Visit | Attending: Orthopedic Surgery

## 2017-04-24 DIAGNOSIS — M2241 Chondromalacia patellae, right knee: Secondary | ICD-10-CM | POA: Diagnosis not present

## 2017-04-24 DIAGNOSIS — Y9344 Activity, trampolining: Secondary | ICD-10-CM | POA: Diagnosis not present

## 2017-04-24 DIAGNOSIS — X58XXXA Exposure to other specified factors, initial encounter: Secondary | ICD-10-CM | POA: Insufficient documentation

## 2017-04-24 DIAGNOSIS — S83511A Sprain of anterior cruciate ligament of right knee, initial encounter: Secondary | ICD-10-CM | POA: Insufficient documentation

## 2017-04-24 DIAGNOSIS — M25461 Effusion, right knee: Secondary | ICD-10-CM | POA: Insufficient documentation

## 2017-04-24 DIAGNOSIS — S83241A Other tear of medial meniscus, current injury, right knee, initial encounter: Secondary | ICD-10-CM | POA: Diagnosis not present

## 2017-04-24 DIAGNOSIS — Z9889 Other specified postprocedural states: Secondary | ICD-10-CM

## 2017-04-24 DIAGNOSIS — Z87891 Personal history of nicotine dependence: Secondary | ICD-10-CM | POA: Diagnosis not present

## 2017-04-24 DIAGNOSIS — S83511D Sprain of anterior cruciate ligament of right knee, subsequent encounter: Secondary | ICD-10-CM

## 2017-04-24 HISTORY — PX: KNEE ARTHROSCOPY WITH MEDIAL MENISECTOMY: SHX5651

## 2017-04-24 LAB — CBC
HEMATOCRIT: 42.6 % (ref 36.0–46.0)
Hemoglobin: 14 g/dL (ref 12.0–15.0)
MCH: 29.9 pg (ref 26.0–34.0)
MCHC: 32.9 g/dL (ref 30.0–36.0)
MCV: 90.8 fL (ref 78.0–100.0)
Platelets: 237 10*3/uL (ref 150–400)
RBC: 4.69 MIL/uL (ref 3.87–5.11)
RDW: 12.3 % (ref 11.5–15.5)
WBC: 3.6 10*3/uL — ABNORMAL LOW (ref 4.0–10.5)

## 2017-04-24 SURGERY — ARTHROSCOPY, KNEE, WITH MEDIAL MENISCECTOMY
Anesthesia: General | Site: Knee | Laterality: Right

## 2017-04-24 MED ORDER — SODIUM CHLORIDE 0.9 % IR SOLN
Status: DC | PRN
  Administered 2017-04-24: 1000 mL

## 2017-04-24 MED ORDER — FENTANYL CITRATE (PF) 100 MCG/2ML IJ SOLN
INTRAMUSCULAR | Status: DC | PRN
  Administered 2017-04-24 (×2): 50 ug via INTRAVENOUS
  Administered 2017-04-24 (×2): 25 ug via INTRAVENOUS

## 2017-04-24 MED ORDER — PROPOFOL 10 MG/ML IV BOLUS
INTRAVENOUS | Status: DC | PRN
  Administered 2017-04-24: 30 mg via INTRAVENOUS
  Administered 2017-04-24: 130 mg via INTRAVENOUS

## 2017-04-24 MED ORDER — LIDOCAINE HCL (CARDIAC) 10 MG/ML IV SOLN
INTRAVENOUS | Status: DC | PRN
  Administered 2017-04-24: 40 mg via INTRAVENOUS

## 2017-04-24 MED ORDER — FENTANYL CITRATE (PF) 100 MCG/2ML IJ SOLN
25.0000 ug | INTRAMUSCULAR | Status: DC | PRN
  Administered 2017-04-24 (×3): 50 ug via INTRAVENOUS
  Filled 2017-04-24 (×2): qty 2

## 2017-04-24 MED ORDER — HYDROCODONE-ACETAMINOPHEN 7.5-325 MG PO TABS: 1.0000 | ORAL_TABLET | ORAL | 0 refills | Status: DC | PRN

## 2017-04-24 MED ORDER — DEXAMETHASONE SODIUM PHOSPHATE 4 MG/ML IJ SOLN
4.0000 mg | Freq: Once | INTRAMUSCULAR | Status: AC
  Administered 2017-04-24: 4 mg via INTRAVENOUS

## 2017-04-24 MED ORDER — CHLORHEXIDINE GLUCONATE 4 % EX LIQD: 60.0000 mL | Freq: Once | CUTANEOUS | Status: DC

## 2017-04-24 MED ORDER — BUPIVACAINE-EPINEPHRINE (PF) 0.5% -1:200000 IJ SOLN
INTRAMUSCULAR | Status: AC
Start: 2017-04-24 — End: 2017-04-24
  Filled 2017-04-24: qty 60

## 2017-04-24 MED ORDER — ONDANSETRON HCL 4 MG/2ML IJ SOLN
4.0000 mg | Freq: Once | INTRAMUSCULAR | Status: AC
  Administered 2017-04-24: 4 mg via INTRAVENOUS

## 2017-04-24 MED ORDER — LACTATED RINGERS IV SOLN
INTRAVENOUS | Status: DC
  Administered 2017-04-24 (×2): via INTRAVENOUS

## 2017-04-24 MED ORDER — SODIUM CHLORIDE 0.9 % IR SOLN
Status: DC | PRN
  Administered 2017-04-24 (×2): 3000 mL

## 2017-04-24 MED ORDER — MIDAZOLAM HCL 2 MG/2ML IJ SOLN
1.0000 mg | INTRAMUSCULAR | Status: AC
  Administered 2017-04-24: 2 mg via INTRAVENOUS

## 2017-04-24 MED ORDER — DEXAMETHASONE SODIUM PHOSPHATE 4 MG/ML IJ SOLN
INTRAMUSCULAR | Status: AC
  Filled 2017-04-24: qty 1

## 2017-04-24 MED ORDER — FENTANYL CITRATE (PF) 250 MCG/5ML IJ SOLN
INTRAMUSCULAR | Status: AC
Start: 2017-04-24 — End: 2017-04-24
  Filled 2017-04-24: qty 5

## 2017-04-24 MED ORDER — IBUPROFEN 800 MG PO TABS: 800.0000 mg | ORAL_TABLET | Freq: Three times a day (TID) | ORAL | 1 refills | Status: DC | PRN

## 2017-04-24 MED ORDER — MIDAZOLAM HCL 2 MG/2ML IJ SOLN
INTRAMUSCULAR | Status: AC
  Filled 2017-04-24: qty 2

## 2017-04-24 MED ORDER — ONDANSETRON HCL 4 MG/2ML IJ SOLN
INTRAMUSCULAR | Status: AC
Start: 2017-04-24 — End: 2017-04-24
  Filled 2017-04-24: qty 2

## 2017-04-24 MED ORDER — PROMETHAZINE HCL 12.5 MG PO TABS: 12.5000 mg | ORAL_TABLET | Freq: Four times a day (QID) | ORAL | 0 refills | Status: DC | PRN

## 2017-04-24 MED ORDER — BUPIVACAINE-EPINEPHRINE (PF) 0.5% -1:200000 IJ SOLN
INTRAMUSCULAR | Status: DC | PRN
  Administered 2017-04-24: 60 mL via PERINEURAL

## 2017-04-24 MED ORDER — CEFAZOLIN SODIUM-DEXTROSE 2-4 GM/100ML-% IV SOLN
2.0000 g | INTRAVENOUS | Status: AC
  Administered 2017-04-24: 2 g via INTRAVENOUS
  Filled 2017-04-24: qty 100

## 2017-04-24 MED ORDER — EPINEPHRINE PF 1 MG/ML IJ SOLN
INTRAMUSCULAR | Status: AC
  Filled 2017-04-24: qty 3

## 2017-04-24 MED ORDER — SCOPOLAMINE 1 MG/3DAYS TD PT72
MEDICATED_PATCH | TRANSDERMAL | Status: AC
  Filled 2017-04-24: qty 1

## 2017-04-24 MED ORDER — SCOPOLAMINE 1 MG/3DAYS TD PT72
1.0000 | MEDICATED_PATCH | Freq: Once | TRANSDERMAL | Status: DC
  Administered 2017-04-24: 1.5 mg via TRANSDERMAL

## 2017-04-24 MED ORDER — PROPOFOL 10 MG/ML IV BOLUS
INTRAVENOUS | Status: AC
  Filled 2017-04-24: qty 20

## 2017-04-24 SURGICAL SUPPLY — 54 items
ARTHROWAND PARAGON T2 (SURGICAL WAND)
BAG HAMPER (MISCELLANEOUS) ×2 IMPLANT
BANDAGE ELASTIC 6 LF NS (GAUZE/BANDAGES/DRESSINGS) ×2 IMPLANT
BLADE AGGRESSIVE PLUS 4.0 (BLADE) ×2 IMPLANT
BLADE SURG SZ11 CARB STEEL (BLADE) ×2 IMPLANT
BNDG CMPR MED 5X6 ELC HKLP NS (GAUZE/BANDAGES/DRESSINGS) ×1
CHLORAPREP W/TINT 26ML (MISCELLANEOUS) ×2 IMPLANT
CLOTH BEACON ORANGE TIMEOUT ST (SAFETY) ×2 IMPLANT
COOLER CRYO IC GRAV AND TUBE (ORTHOPEDIC SUPPLIES) ×2 IMPLANT
CUFF CRYO KNEE18X23 MED (MISCELLANEOUS) ×1 IMPLANT
CUFF TOURNIQUET SINGLE 34IN LL (TOURNIQUET CUFF) ×1 IMPLANT
CUTTER ANGLED DBL BITE 4.5 (BURR) IMPLANT
CUTTER TOMCAT 5.0MM (BURR) ×1 IMPLANT
DECANTER SPIKE VIAL GLASS SM (MISCELLANEOUS) ×4 IMPLANT
GAUZE SPONGE 4X4 12PLY STRL (GAUZE/BANDAGES/DRESSINGS) ×2 IMPLANT
GAUZE SPONGE 4X4 16PLY XRAY LF (GAUZE/BANDAGES/DRESSINGS) ×2 IMPLANT
GAUZE XEROFORM 5X9 LF (GAUZE/BANDAGES/DRESSINGS) ×2 IMPLANT
GLOVE BIOGEL PI IND STRL 7.0 (GLOVE) ×1 IMPLANT
GLOVE BIOGEL PI INDICATOR 7.0 (GLOVE) ×1
GLOVE SKINSENSE NS SZ8.0 LF (GLOVE) ×1
GLOVE SKINSENSE STRL SZ8.0 LF (GLOVE) ×1 IMPLANT
GLOVE SS N UNI LF 8.5 STRL (GLOVE) ×2 IMPLANT
GOWN STRL REUS W/ TWL LRG LVL3 (GOWN DISPOSABLE) ×1 IMPLANT
GOWN STRL REUS W/TWL LRG LVL3 (GOWN DISPOSABLE) ×2
GOWN STRL REUS W/TWL XL LVL3 (GOWN DISPOSABLE) ×2 IMPLANT
HLDR LEG FOAM (MISCELLANEOUS) ×1 IMPLANT
IV NS IRRIG 3000ML ARTHROMATIC (IV SOLUTION) ×4 IMPLANT
KIT BLADEGUARD II DBL (SET/KITS/TRAYS/PACK) ×2 IMPLANT
KIT ROOM TURNOVER AP CYSTO (KITS) ×2 IMPLANT
LEG HOLDER FOAM (MISCELLANEOUS) ×1
MANIFOLD NEPTUNE II (INSTRUMENTS) ×2 IMPLANT
MARKER SKIN DUAL TIP RULER LAB (MISCELLANEOUS) ×2 IMPLANT
NDL HYPO 18GX1.5 BLUNT FILL (NEEDLE) ×1 IMPLANT
NDL HYPO 21X1.5 SAFETY (NEEDLE) ×1 IMPLANT
NDL SPNL 18GX3.5 QUINCKE PK (NEEDLE) ×1 IMPLANT
NEEDLE HYPO 18GX1.5 BLUNT FILL (NEEDLE) ×2 IMPLANT
NEEDLE HYPO 21X1.5 SAFETY (NEEDLE) ×2 IMPLANT
NEEDLE SPNL 18GX3.5 QUINCKE PK (NEEDLE) ×2 IMPLANT
NS IRRIG 1000ML POUR BTL (IV SOLUTION) ×2 IMPLANT
PACK ARTHRO LIMB DRAPE STRL (MISCELLANEOUS) ×2 IMPLANT
PAD ABD 5X9 TENDERSORB (GAUZE/BANDAGES/DRESSINGS) ×2 IMPLANT
PAD ARMBOARD 7.5X6 YLW CONV (MISCELLANEOUS) ×2 IMPLANT
PADDING CAST COTTON 6X4 STRL (CAST SUPPLIES) ×2 IMPLANT
PADDING WEBRIL 6 STERILE (GAUZE/BANDAGES/DRESSINGS) ×1 IMPLANT
PROBE BIPOLAR 50 DEGREE SUCT (MISCELLANEOUS) ×1 IMPLANT
PROBE BIPOLAR ATHRO 135MM 90D (MISCELLANEOUS) IMPLANT
SET ARTHROSCOPY INST (INSTRUMENTS) ×2 IMPLANT
SET BASIN LINEN APH (SET/KITS/TRAYS/PACK) ×2 IMPLANT
SUT ETHILON 3 0 FSL (SUTURE) ×2 IMPLANT
SYR 30ML LL (SYRINGE) ×2 IMPLANT
SYRINGE 10CC LL (SYRINGE) ×2 IMPLANT
TUBE CONNECTING 12X1/4 (SUCTIONS) ×4 IMPLANT
TUBING ARTHRO INFLOW-ONLY STRL (TUBING) ×2 IMPLANT
WAND ARTHRO PARAGON T2 (SURGICAL WAND) IMPLANT

## 2017-04-24 NOTE — Progress Notes (Signed)
Patient "a little itchy". Denies shortness of breath, denies difficulty swallowing. No rashes noted. Patient states "I have sensitive skin".  Reviewed with Spouse Lanny Hurst and family of signs and symptoms of allergic reaction. Lanny Hurst husband verbalized understanding.

## 2017-04-24 NOTE — Anesthesia Postprocedure Evaluation (Signed)
Anesthesia Post Note  Patient: Natalie Hess  Procedure(s) Performed: KNEE ARTHROSCOPY WITH PARTIAL MEDIAL MENISECTOMY; ACL DEBRIEDMENT (Right Knee)  Patient location during evaluation: PACU Anesthesia Type: General Level of consciousness: awake and alert and oriented Pain management: pain level controlled Vital Signs Assessment: post-procedure vital signs reviewed and stable Respiratory status: spontaneous breathing Cardiovascular status: blood pressure returned to baseline and stable Postop Assessment: no apparent nausea or vomiting Anesthetic complications: no     Last Vitals:  Vitals:   04/24/17 1430 04/24/17 1445  BP: (!) 147/83 140/82  Pulse: 76 72  Resp: (!) 24 16  Temp:    SpO2: 99% 98%    Last Pain:  Vitals:   04/24/17 1445  TempSrc:   PainSc: 4                  [name]

## 2017-04-24 NOTE — Discharge Instructions (Signed)
Out of work times 4 weeks   PATIENT INSTRUCTIONS POST-ANESTHESIA  IMMEDIATELY FOLLOWING SURGERY:  Do not drive or operate machinery for the first twenty four hours after surgery.  Do not make any important decisions for twenty four hours after surgery or while taking narcotic pain medications or sedatives.  If you develop intractable nausea and vomiting or a severe headache please notify your doctor immediately.  FOLLOW-UP:  Please make an appointment with your surgeon as instructed. You do not need to follow up with anesthesia unless specifically instructed to do so.  WOUND CARE INSTRUCTIONS (if applicable):  Keep a dry clean dressing on the anesthesia/puncture wound site if there is drainage.  Once the wound has quit draining you may leave it open to air.  Generally you should leave the bandage intact for twenty four hours unless there is drainage.  If the epidural site drains for more than 36-48 hours please call the anesthesia department.  QUESTIONS?:  Please feel free to call your physician or the hospital operator if you have any questions, and they will be happy to assist you.

## 2017-04-24 NOTE — Op Note (Signed)
04/24/2017  1:43 PM  PATIENT:  Natalie Hess  57 y.o. female  PRE-OPERATIVE DIAGNOSIS:  ACL and medial meniscal tear right knee  POST-OPERATIVE DIAGNOSIS:  ACL and medial meniscal tear right knee  PROCEDURE:  Procedure(s): KNEE ARTHROSCOPY WITH PARTIAL MEDIAL MENISECTOMY; ACL DEBRIEDMENT (Right)   Exam under anesthesia: 2+ Lockman grade 1 pivot full range of motion  Intra-Op torn ACL mid substance,  posterior horn medial meniscus tear,  chondral lesion medial femoral condyle,  chondromalacia lateral tibial plateau and patella   SURGEON:  Surgeon(s) and Role:    Carole Civil, MD - Primary  PHYSICIAN ASSISTANT:   ASSISTANTS: none   ANESTHESIA:   general  EBL:  0 mL   BLOOD ADMINISTERED:none  DRAINS: none   LOCAL MEDICATIONS USED:  MARCAINE     SPECIMEN:  No Specimen  DISPOSITION OF SPECIMEN:  N/A  COUNTS:  YES  TOURNIQUET:  * Missing tourniquet times found for documented tourniquets in log: [id number] *  DICTATION: .Dragon Dictation  PLAN OF CARE: Discharge to home after PACU  PATIENT DISPOSITION:  PACU - hemodynamically stable.   Delay start of Pharmacological VTE agent (>24hrs) due to surgical blood loss or risk of bleeding: not applicable  [id number]  Knee arthroscopy dictation  The patient was identified in the preoperative holding area using 2 approved identification mechanisms. The chart was reviewed and updated. The surgical site was confirmed as right knee and marked with an indelible marker.  The patient was taken to the operating room for anesthesia. After successful general anesthesia, 2 g Ancef was used as IV antibiotics.  The patient was placed in the supine position with the (right) the operative extremity in an arthroscopic leg holder and the opposite extremity in a padded leg holder.  The timeout was executed.  A lateral portal was established with an 11 blade and the scope was introduced into the joint. A diagnostic arthroscopy was  performed in circumferential manner examining the entire knee joint. A medial portal was established and the diagnostic arthroscopy was repeated using a probe to palpate intra-articular structures as they were encountered.   Primary findings torn ACL mid substance With posterior horn medial meniscus tear  The medial meniscus was resected using a duckbill forceps. The meniscal fragments were removed with a motorized shaver. The meniscus was balanced with a combination of a motorized shaver and a 50 ArthroCare wand until a stable rim was obtained.  ACL was debrided I did leave some of the posterior fibers to scarred to the PCL but I did remove any impinging lesion check in the knee in extension  The arthroscopic pump was placed on the wash mode and any excess debris was removed from the joint using suction.  60 cc of Marcaine with epinephrine was injected through the arthroscope.  The portals were closed with 3-0 nylon suture.  A sterile bandage, Ace wrap and Cryo/Cuff was placed and the Cryo/Cuff was activated. The patient was taken to the recovery room in stable condition.

## 2017-04-24 NOTE — Telephone Encounter (Signed)
Done

## 2017-04-24 NOTE — Interval H&P Note (Signed)
History and Physical Interval Note:  04/24/2017 11:30 AM  Natalie Hess  has presented today for surgery, with the diagnosis of ACL and medial meniscal tear right knee  The various methods of treatment have been discussed with the patient and family. After consideration of risks, benefits and other options for treatment, the patient has consented to  Procedure(s): KNEE ARTHROSCOPY WITH MEDIAL MENISECTOMY (Right) as a surgical intervention .  The patient's history has been reviewed, patient examined, no change in status, stable for surgery.  I have reviewed the patient's chart and labs.  Questions were answered to the patient's satisfaction.     Arther Abbott

## 2017-04-24 NOTE — Brief Op Note (Signed)
04/24/2017  1:43 PM  PATIENT:  Natalie Hess  57 y.o. female  PRE-OPERATIVE DIAGNOSIS:  ACL and medial meniscal tear right knee  POST-OPERATIVE DIAGNOSIS:  ACL and medial meniscal tear right knee  PROCEDURE:  Procedure(s): KNEE ARTHROSCOPY WITH PARTIAL MEDIAL MENISECTOMY; ACL DEBRIEDMENT (Right)   Exam under anesthesia: 2+ Lockman grade 1 pivot full range of motion  Intra-Op torn ACL mid substance,  posterior horn medial meniscus tear,  chondral lesion medial femoral condyle,  chondromalacia lateral tibial plateau and patella   SURGEON:  Surgeon(s) and Role:    Carole Civil, MD - Primary  PHYSICIAN ASSISTANT:   ASSISTANTS: none   ANESTHESIA:   general  EBL:  0 mL   BLOOD ADMINISTERED:none  DRAINS: none   LOCAL MEDICATIONS USED:  MARCAINE     SPECIMEN:  No Specimen  DISPOSITION OF SPECIMEN:  N/A  COUNTS:  YES  TOURNIQUET:  * Missing tourniquet times found for documented tourniquets in log: [id number] *  DICTATION: .Dragon Dictation  PLAN OF CARE: Discharge to home after PACU  PATIENT DISPOSITION:  PACU - hemodynamically stable.   Delay start of Pharmacological VTE agent (>24hrs) due to surgical blood loss or risk of bleeding: not applicable  [id number]  Knee arthroscopy dictation  The patient was identified in the preoperative holding area using 2 approved identification mechanisms. The chart was reviewed and updated. The surgical site was confirmed as right knee and marked with an indelible marker.  The patient was taken to the operating room for anesthesia. After successful general anesthesia, 2 g Ancef was used as IV antibiotics.  The patient was placed in the supine position with the (right) the operative extremity in an arthroscopic leg holder and the opposite extremity in a padded leg holder.  The timeout was executed.  A lateral portal was established with an 11 blade and the scope was introduced into the joint. A diagnostic arthroscopy was  performed in circumferential manner examining the entire knee joint. A medial portal was established and the diagnostic arthroscopy was repeated using a probe to palpate intra-articular structures as they were encountered.   Primary findings torn ACL mid substance With posterior horn medial meniscus tear  The medial meniscus was resected using a duckbill forceps. The meniscal fragments were removed with a motorized shaver. The meniscus was balanced with a combination of a motorized shaver and a 50 ArthroCare wand until a stable rim was obtained.  ACL was debrided I did leave some of the posterior fibers to scarred to the PCL but I did remove any impinging lesion check in the knee in extension  The arthroscopic pump was placed on the wash mode and any excess debris was removed from the joint using suction.  60 cc of Marcaine with epinephrine was injected through the arthroscope.  The portals were closed with 3-0 nylon suture.  A sterile bandage, Ace wrap and Cryo/Cuff was placed and the Cryo/Cuff was activated. The patient was taken to the recovery room in stable condition.

## 2017-04-24 NOTE — Anesthesia Preprocedure Evaluation (Signed)
Anesthesia Evaluation  Patient identified by MRN, date of birth, ID band Patient awake    Reviewed: Allergy & Precautions, NPO status , Patient's Chart, lab work & pertinent test results  History of Anesthesia Complications (+) PONV and history of anesthetic complications  Airway Mallampati: I  TM Distance: >3 FB Neck ROM: Full    Dental  (+) Teeth Intact   Pulmonary neg pulmonary ROS, former smoker,    breath sounds clear to auscultation       Cardiovascular negative cardio ROS   Rhythm:Regular Rate:Normal     Neuro/Psych negative neurological ROS  negative psych ROS   GI/Hepatic negative GI ROS, Neg liver ROS,   Endo/Other  negative endocrine ROS  Renal/GU negative Renal ROS     Musculoskeletal   Abdominal   Peds  Hematology negative hematology ROS (+)   Anesthesia Other Findings   Reproductive/Obstetrics                             Anesthesia Physical Anesthesia Plan  ASA: I  Anesthesia Plan: General   Post-op Pain Management:    Induction: Intravenous  PONV Risk Score and Plan:   Airway Management Planned: LMA  Additional Equipment:   Intra-op Plan:   Post-operative Plan: Extubation in OR  Informed Consent: I have reviewed the patients History and Physical, chart, labs and discussed the procedure including the risks, benefits and alternatives for the proposed anesthesia with the patient or authorized representative who has indicated his/her understanding and acceptance.     Plan Discussed with:   Anesthesia Plan Comments:         Anesthesia Quick Evaluation

## 2017-04-24 NOTE — Transfer of Care (Signed)
Immediate Anesthesia Transfer of Care Note  Patient: Natalie Hess  Procedure(s) Performed: KNEE ARTHROSCOPY WITH PARTIAL MEDIAL MENISECTOMY; ACL DEBRIEDMENT (Right Knee)  Patient Location: PACU  Anesthesia Type:General  Level of Consciousness: awake and alert   Airway & Oxygen Therapy: Patient Spontanous Breathing  Post-op Assessment: Report given to RN  Post vital signs: Reviewed and stable  Last Vitals:  Vitals:   04/24/17 1200 04/24/17 1205  BP: (!) 141/89   Pulse:    Resp: 11 (!) 33  Temp:    SpO2: 98% 100%    Last Pain:  Vitals:   04/24/17 1145  TempSrc: Oral      Patients Stated Pain Goal: 5 (82/99/37 1696)  Complications: No apparent anesthesia complications

## 2017-04-24 NOTE — Anesthesia Procedure Notes (Signed)
Procedure Name: LMA Insertion Date/Time: 04/24/2017 12:57 PM Performed by: Ollen Bowl, CRNA Pre-anesthesia Checklist: Patient identified, Patient being monitored, Emergency Drugs available, Timeout performed and Suction available Patient Re-evaluated:Patient Re-evaluated prior to induction Oxygen Delivery Method: Circle System Utilized Preoxygenation: Pre-oxygenation with 100% oxygen Induction Type: IV induction Ventilation: Mask ventilation without difficulty LMA: LMA inserted LMA Size: 3.0 Number of attempts: 1 Placement Confirmation: positive ETCO2 and breath sounds checked- equal and bilateral

## 2017-04-25 ENCOUNTER — Ambulatory Visit: Payer: Self-pay | Admitting: Orthopedic Surgery

## 2017-04-25 ENCOUNTER — Encounter (HOSPITAL_COMMUNITY): Payer: Self-pay | Admitting: Orthopedic Surgery

## 2017-05-01 DIAGNOSIS — Z9889 Other specified postprocedural states: Secondary | ICD-10-CM | POA: Insufficient documentation

## 2017-05-05 ENCOUNTER — Encounter: Payer: Self-pay | Admitting: Orthopedic Surgery

## 2017-05-05 ENCOUNTER — Ambulatory Visit (INDEPENDENT_AMBULATORY_CARE_PROVIDER_SITE_OTHER): Payer: 59 | Admitting: Orthopedic Surgery

## 2017-05-05 VITALS — BP 137/83 | HR 76 | Ht 67.0 in | Wt 134.0 lb

## 2017-05-05 DIAGNOSIS — Z9889 Other specified postprocedural states: Secondary | ICD-10-CM

## 2017-05-05 NOTE — Patient Instructions (Addendum)
3 times a day continue 25 knee bends and knee straightening exercises 3 times a day start physical therapy return in 3 weeks continue out of work through March 11 with return to date March 12  Physical therapy has been ordered for you at [facility] [phone] is the phone number to call if you want to call to schedule. Please let us know if you do not hear anything within one week.

## 2017-05-05 NOTE — Progress Notes (Signed)
POST OP VISIT   Chief Complaint  Patient presents with  . Post-op Follow-up    04/24/17     Encounter Diagnosis  Name Primary?  . S/P right knee arthroscopy 04/24/17 Yes     Current Outpatient Medications:  .  HYDROcodone-acetaminophen (NORCO) 7.5-325 MG tablet, Take 1 tablet by mouth every 4 (four) hours as needed for moderate pain., Disp: 30 tablet, Rfl: 0 .  ibuprofen (ADVIL,MOTRIN) 800 MG tablet, Take 1 tablet (800 mg total) by mouth every 8 (eight) hours as needed., Disp: 90 tablet, Rfl: 1 .  naproxen sodium (ALEVE) 220 MG tablet, Take 220 mg by mouth daily as needed (for knee inflammation)., Disp: , Rfl:  .  promethazine (PHENERGAN) 12.5 MG tablet, Take 1 tablet (12.5 mg total) by mouth every 6 (six) hours as needed for nausea or vomiting., Disp: 30 tablet, Rfl: 0  THERE ARE NO COMPLAINTS   THE WOUND LOOKS CLEAN AND THERE ARE NO SIGNS OF ERYTHEMA  NEUROVASCULAR EXAM IS NORMAL   EVERYTHING LOOKS GOOD she was able to get full extension her flexion is 105 degrees she has mild swelling   Operative note torn ACL midsubstance posterior horn medial meniscus chondromalacia medial femoral condyle lateral tibial plateau and patella  FU WILL BE SCHEDULED weeks out of work through March 11 follow-up March 11 return March 12 continue knee brace

## 2017-05-07 ENCOUNTER — Ambulatory Visit (HOSPITAL_COMMUNITY): Payer: 59 | Attending: Orthopedic Surgery

## 2017-05-07 ENCOUNTER — Encounter (HOSPITAL_COMMUNITY): Payer: Self-pay

## 2017-05-07 DIAGNOSIS — M25661 Stiffness of right knee, not elsewhere classified: Secondary | ICD-10-CM

## 2017-05-07 DIAGNOSIS — M6281 Muscle weakness (generalized): Secondary | ICD-10-CM | POA: Diagnosis not present

## 2017-05-07 DIAGNOSIS — R600 Localized edema: Secondary | ICD-10-CM

## 2017-05-07 NOTE — Therapy (Signed)
[place], [place], [address] Phone: [phone]   Fax:  [phone]  Physical Therapy Evaluation  Patient Details  Name: Natalie Hess MRN: [id number] Date of Birth: 09/27/1960 Referring Provider: Arther Abbott, MD   Encounter Date: 05/07/2017  PT End of Session - 05/07/17 1514    Visit Number  1    Number of Visits  13    Date for PT Re-Evaluation  05/28/17    Authorization Type  [facility]    Authorization Time Period  05/07/17 to 06/18/17    PT Start Time  1432    PT Stop Time  1515    PT Time Calculation (min)  43 min    Activity Tolerance  Patient tolerated treatment well    Behavior During Therapy  [facility] for tasks assessed/performed       Past Medical History:  Diagnosis Date  . Medical history non-contributory   . PONV (postoperative nausea and vomiting)     Past Surgical History:  Procedure Laterality Date  . ABDOMINAL HYSTERECTOMY    . CESAREAN SECTION     X 2  . COLONOSCOPY N/A 02/17/2015   Procedure: COLONOSCOPY;  Surgeon: Rogene Houston, MD;  [facility];  Service: Endoscopy;  Laterality: N/A;  930  . KNEE ARTHROSCOPY WITH MEDIAL MENISECTOMY Right 04/24/2017   Procedure: KNEE ARTHROSCOPY WITH PARTIAL MEDIAL MENISECTOMY; ACL DEBRIEDMENT;  Surgeon: Carole Civil, MD;  Location: [facility];  Service: Orthopedics;  Laterality: Right;    There were no vitals filed for this visit.   Subjective Assessment - 05/07/17 1439    Subjective  Pt states that on 04/20/17 she was jumping on a trampoline and when she landed her knee hyperextended. She felt immediate pain. Dr. Aline Brochure performed a R knee scope on 04/24/17 who performed a partial medial menisectomy and debridement of ACL. He did not repair her ACL because per pt, he told her she had a few strands left that were attached. She's only having minor pain at certain times. Bending it the most difficult thing for her to do. She reports she  also has a little trouble with edema. 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    Limitations  Standing;Walking    How long can you sit comfortably?  no issues    How long can you stand comfortably?  maybe 45 mins    How long can you walk comfortably?  unsure, hasn't done a lot    Patient Stated Goals  not have anymore surgery    Currently in Pain?  No/denies         [facility] PT Assessment - 05/07/17 0001      Assessment   Medical Diagnosis  S/P right knee arthroscopy    Referring Provider  Arther Abbott, MD    Onset Date/Surgical Date  04/24/17    Next MD Visit  05/26/17    Prior Therapy  some on shoulder but none for present issue      Precautions   Precaution Comments  Hinged knee brace when OOB (does not sleep in it) - Dr. Aline Brochure told her to wear the hinged brace every time she is on it; when she f/u with him he will determine if/when she can stop wearing it      Balance Screen   Has the patient fallen in the past 6 months  No    Has the patient had a  decrease in activity level because of a fear of falling?   No    Is the patient reluctant to leave their home because of a fear of falling?   No      Observation/Other Assessments-Edema    Edema  Circumferential      Circumferential Edema   Circumferential - Right  13.5" joint line; 3" suprapatellar: 15.75"    Circumferential - Left   12.5" joint line; 3" suprapatellar:  15.75"      ROM / Strength   AROM / PROM / Strength  AROM;Strength      AROM   AROM Assessment Site  Knee    Right/Left Knee  Right    Right Knee Extension  0    Right Knee Flexion  94      Strength   Strength Assessment Site  Hip;Knee;Ankle    Right Hip Flexion  4+/5    Right Hip Extension  4/5    Right Hip ABduction  4/5    Left Hip Flexion  5/5    Left Hip Extension  4-/5    Left Hip ABduction  4+/5    Right Knee Flexion  5/5    Right Knee Extension  5/5    Left Knee Flexion  -- did not objectively measure du to recent  surgery,grossly 3/5    Left Knee Extension  -- did not objectively measure du to recent surgery,grossly 3/5    Right Ankle Dorsiflexion  4+/5    Left Ankle Dorsiflexion  5/5      Flexibility   Soft Tissue Assessment /Muscle Length  yes    Hamstrings  R tighter than L and more guarded, but HS length WFL      Palpation   Patella mobility  med/lat WNL, sup/inf mildly hypomobile    Palpation comment  increased soft tissue restrictions and tenderness to palpation of R distal quads, HS, medial/lateral joint line and distal patellar tendon      Ambulation/Gait   Ambulation Distance (Feet)  484 Feet    Assistive device  -- hinged knee brace    Gait Pattern  Step-through pattern;Decreased stance time - right;Decreased step length - left;Decreased hip/knee flexion - right;Right foot flat;Antalgic incr knee flex during stance phase, incr L ant pelvic ro      Balance   Balance Assessed  Yes      Static Standing Balance   Static Standing - Balance Support  No upper extremity supported    Static Standing Balance -  Activities   Single Leg Stance - Right Leg;Single Leg Stance - Left Leg    Static Standing - Comment/# of Minutes  R: 20 sec (incr knee flex, hip flex) L: 18 sec      Standardized Balance Assessment   Standardized Balance Assessment  Five Times Sit to Stand;Timed Up and Go Test    Five times sit to stand comments   12.5, RLE extended, BUE support          Objective measurements completed on examination: See above findings.       PT Education - 05/07/17 1514    Education provided  Yes    Education Details  exam findings, POC, HEP    Person(s) Educated  Patient    Methods  Explanation;Handout    Comprehension  Verbalized understanding       PT Short Term Goals - 05/07/17 2033      PT SHORT TERM GOAL #1   Title  Pt will be  independent with HEP and perform consistently in order to maximize ROM and return to PLOF.    Time  3    Period  Weeks    Status  New    Target  Date  05/28/17      PT SHORT TERM GOAL #2   Title  Pt will have improved R knee flexion AROM to 120 deg or > to maximize gait, stair ambulation, and return to PLOF.    Time  3    Period  Weeks    Status  New      PT SHORT TERM GOAL #3   Title  Pt will be able to perform bil SLS for 30 sec or > with min to no sway to demo improved balance and functional strength in order to maximize gait.     Time  3    Period  Weeks    Status  New      PT SHORT TERM GOAL #4   Title  Pt will have decreased joint line edema of RLE by 1" to be symmetrical with LLE in order to maximize ROM.    Time  3    Period  Weeks    Status  New        PT Long Term Goals - 05/07/17 2039      PT LONG TERM GOAL #1   Title  Pt will have at least 4+/5 MMT throughout all muscle groups tested in order to maximize gait, balance, and promote return to PLOF.    Time  6    Period  Weeks    Status  New    Target Date  06/18/17      PT LONG TERM GOAL #2   Title  Pt will be able to ambulate at least 760ft in 3MWT with min to no gait deviations in order to maximize community ambulation, demonstrate improved functional strength, and promote return to PLOF.    Time  6    Period  Weeks    Status  New      PT LONG TERM GOAL #3   Title  Pt will be able to perform 5xSTS in 10 sec or < with proper mechanics to demonstrate improved functional BLE strength.     Time  6    Period  Weeks    Status  New      PT LONG TERM GOAL #4   Title  If cleared by MD, pt will report being able to work for 4 hours or > with min to no knee pain to demonstrate return to PLOF.     Time  6    Period  Weeks    Status  New      PT LONG TERM GOAL #5   Title  If cleared by MD, pt will be able to return to her regular exercise routine at least 3x/week to demonstrate return to PLOF and promote self-maintence at home.    Time  6    Period  Weeks    Status  New             Plan - 05/07/17 1650    Clinical Impression Statement  Pt is  pleasant 57 YO F  who presents to OPPT s/p R knee scope on 04/24/17 by Dr. Aline Brochure who performed a partial medial menisectomy and debridement of ACL. This PT confirmed that the ACL was not repaired and she does not have a post-op protocol. She is, however, to  have her hinged brace donned when WB/OOB. She currently presents with post-op deficits in edema, ROM, MMT, gait, balance, and functional strength as detailed in this note above. Pt needs skilled PT intervention to address these deficits in order to maximize return to PLOF.    History and Personal Factors relevant to plan of care:  young, motivated, otherwise healthy adult, first time injury    Clinical Presentation  Stable    Clinical Presentation due to:  edema, ROM, MMT, SLS, 3MWT, 5xSTS, functional strength    Clinical Decision Making  Low    Rehab Potential  Good    PT Frequency  2x / week    PT Duration  6 weeks    PT Treatment/Interventions  ADLs/Self Care Home Management;Cryotherapy;Electrical Stimulation;Moist Heat;Ultrasound;DME Instruction;Gait training;Stair training;Functional mobility training;Therapeutic activities;Therapeutic exercise;Balance training;Neuromuscular re-education;Patient/family education;Manual techniques;Passive range of motion;Dry needling;Energy conservation;Splinting;Taping    PT Next Visit Plan  review goals and HEP; manual for edema, manual for soft tissue restrictions, initiate BLE and functional strengthening to tolerance, mobility work to improve ROM    PT Home Exercise Plan  2/20: quad sets, heel slides, standing calf stretcha at wall    Consulted and Agree with Plan of Care  Patient       Patient will benefit from skilled therapeutic intervention in order to improve the following deficits and impairments:  Abnormal gait, Decreased activity tolerance, Decreased balance, Decreased endurance, Decreased range of motion, Decreased strength, Difficulty walking, Hypomobility, Increased edema, Increased fascial  restricitons, Increased muscle spasms, Impaired flexibility, Improper body mechanics, Pain  Visit Diagnosis: Stiffness of right knee, not elsewhere classified - Plan: PT plan of care cert/re-cert  Localized edema - Plan: PT plan of care cert/re-cert  Muscle weakness (generalized) - Plan: PT plan of care cert/re-cert     Problem List Patient Active Problem List   Diagnosis Date Noted  . S/P right knee arthroscopy 04/25/17 05/01/2017  . Vasomotor flushing 08/30/2014  . Pain in joint, shoulder region 12/14/2013  . Muscle weakness (generalized) 12/14/2013  . Decreased range of motion of right shoulder 12/14/2013       Geraldine Solar PT, DPT  [place] [address] [place], [place], [address] Phone: [phone]   Fax:  [phone]  Name: Natalie Hess MRN: [id number] Date of Birth: April 01, 1960

## 2017-05-07 NOTE — Patient Instructions (Signed)
  QUAD SET  Tighten your top thigh muscle as you attempt to press the back of your knee downward towards the table.  Perform 2x/day, 2-3 sets of 10-15 reps holding for 5-10 seconds  HEEL SLIDES - LONG SIT WITH TOWEL AND BELT  While in a sitting position, place a small hand towel under your heel. Next, loop a belt, towel or bed sheet around your foot and pull your knee into a bend position as your foot slides towards your buttock. Hold a gentle stretch and then return back to original position.  Perform 2x/day, 2-3 sets of 10-15 reps     STANDING CALF STRETCH  - GASTROCNEMIUS  Start by standing in front of a wall or other sturdy object. Step forward with one foot and maintain your toes on both feet to be pointed straight forward. Keep the leg behind you with a straight knee during the stretch.   Lean forward towards the wall and support yourself with your arms as you allow your front knee to bend until a gentle stretch is felt along the back of your leg that is most behind you.   Move closer or further away from the wall to control the stretch of the back leg. Also you can adjust the bend of the front knee to control the stretch as well.   Perform 2x/day, 3-5 stretches holding for 30-60 seconds

## 2017-05-13 ENCOUNTER — Encounter (HOSPITAL_COMMUNITY): Payer: Self-pay

## 2017-05-13 ENCOUNTER — Telehealth (HOSPITAL_COMMUNITY): Payer: Self-pay

## 2017-05-13 ENCOUNTER — Ambulatory Visit (HOSPITAL_COMMUNITY): Payer: 59

## 2017-05-13 DIAGNOSIS — M6281 Muscle weakness (generalized): Secondary | ICD-10-CM

## 2017-05-13 DIAGNOSIS — M25661 Stiffness of right knee, not elsewhere classified: Secondary | ICD-10-CM

## 2017-05-13 DIAGNOSIS — R600 Localized edema: Secondary | ICD-10-CM | POA: Diagnosis not present

## 2017-05-13 NOTE — Telephone Encounter (Signed)
She will go back to work and needs afternoon apptments-changed her schedule today.

## 2017-05-13 NOTE — Therapy (Signed)
[place], [place], [address] Phone: [phone]   Fax:  [phone]  Physical Therapy Treatment  Patient Details  Name: Natalie Hess MRN: [id number] Date of Birth: 07/02/1960 Referring Provider: Arther Abbott, MD   Encounter Date: 05/13/2017  PT End of Session - 05/13/17 1128    Visit Number  2    Number of Visits  13    Date for PT Re-Evaluation  05/28/17    Authorization Type  [facility]    Authorization Time Period  05/07/17 to 06/18/17    PT Start Time  1125    PT Stop Time  1206    PT Time Calculation (min)  41 min    Activity Tolerance  Patient tolerated treatment well    Behavior During Therapy  [facility] for tasks assessed/performed       Past Medical History:  Diagnosis Date  . Medical history non-contributory   . PONV (postoperative nausea and vomiting)     Past Surgical History:  Procedure Laterality Date  . ABDOMINAL HYSTERECTOMY    . CESAREAN SECTION     X 2  . COLONOSCOPY N/A 02/17/2015   Procedure: COLONOSCOPY;  Surgeon: Rogene Houston, MD;  [facility];  Service: Endoscopy;  Laterality: N/A;  930  . KNEE ARTHROSCOPY WITH MEDIAL MENISECTOMY Right 04/24/2017   Procedure: KNEE ARTHROSCOPY WITH PARTIAL MEDIAL MENISECTOMY; ACL DEBRIEDMENT;  Surgeon: Carole Civil, MD;  Location: [facility];  Service: Orthopedics;  Laterality: Right;    There were no vitals filed for this visit.  Subjective Assessment - 05/13/17 1127    Subjective  Pt stated her knee is feeling good today, feels the flexion is improving.  Has been compliant with HEP daily without questions.  No reoprts of current pain.      Patient Stated Goals  not have anymore surgery    Currently in Pain?  No/denies                      [facility] Adult PT Treatment/Exercise - 05/13/17 0001      Exercises   Exercises  Knee/Hip      Knee/Hip Exercises: Standing   Heel Raises  10 reps Toe raises 10x    Heel  Raises Limitations  Toe raises 10x    SLS  3x Lt 60", Rt 33"      Knee/Hip Exercises: Supine   Quad Sets  10 reps    Short Arc Quad Sets  15 reps    Heel Slides  10 reps    Bridges  10 reps    Straight Leg Raises  10 reps    Patellar Mobs  complete all directions    Knee Extension  AROM    Knee Extension Limitations  0    Knee Flexion  AROM    Knee Flexion Limitations  120      Manual Therapy   Manual Therapy  Edema management    Manual therapy comments  Manual complete separate than rest of tx    Edema Management  Retro massage with LE elevated               PT Short Term Goals - 05/07/17 2033      PT SHORT TERM GOAL #1   Title  Pt will be independent with HEP and perform consistently in order to maximize ROM and return to PLOF.    Time  3  Period  Weeks    Status  New    Target Date  05/28/17      PT SHORT TERM GOAL #2   Title  Pt will have improved R knee flexion AROM to 120 deg or > to maximize gait, stair ambulation, and return to PLOF.    Time  3    Period  Weeks    Status  New      PT SHORT TERM GOAL #3   Title  Pt will be able to perform bil SLS for 30 sec or > with min to no sway to demo improved balance and functional strength in order to maximize gait.     Time  3    Period  Weeks    Status  New      PT SHORT TERM GOAL #4   Title  Pt will have decreased joint line edema of RLE by 1" to be symmetrical with LLE in order to maximize ROM.    Time  3    Period  Weeks    Status  New        PT Long Term Goals - 05/07/17 2039      PT LONG TERM GOAL #1   Title  Pt will have at least 4+/5 MMT throughout all muscle groups tested in order to maximize gait, balance, and promote return to PLOF.    Time  6    Period  Weeks    Status  New    Target Date  06/18/17      PT LONG TERM GOAL #2   Title  Pt will be able to ambulate at least 767ft in 3MWT with min to no gait deviations in order to maximize community ambulation, demonstrate improved  functional strength, and promote return to PLOF.    Time  6    Period  Weeks    Status  New      PT LONG TERM GOAL #3   Title  Pt will be able to perform 5xSTS in 10 sec or < with proper mechanics to demonstrate improved functional BLE strength.     Time  6    Period  Weeks    Status  New      PT LONG TERM GOAL #4   Title  If cleared by MD, pt will report being able to work for 4 hours or > with min to no knee pain to demonstrate return to PLOF.     Time  6    Period  Weeks    Status  New      PT LONG TERM GOAL #5   Title  If cleared by MD, pt will be able to return to her regular exercise routine at least 3x/week to demonstrate return to PLOF and promote self-maintence at home.    Time  6    Period  Weeks    Status  New            Plan - 05/13/17 1235    Clinical Impression Statement  Reviewed goals, assured compliance with HEP and copy of eval given to pt.  Pt progressing well with minimal reports of pain mainly with end range flexion at anterior knee location and improved AROM 0-120 degrees today (was 0-94 degrees last session).  Began session with manual for edema control prior ROM based exercises.  Pt completed all exercises with good form/technique.      Rehab Potential  Good    PT Frequency  2x / week    PT Duration  6 weeks    PT Treatment/Interventions  ADLs/Self Care Home Management;Cryotherapy;Electrical Stimulation;Moist Heat;Ultrasound;DME Instruction;Gait training;Stair training;Functional mobility training;Therapeutic activities;Therapeutic exercise;Balance training;Neuromuscular re-education;Patient/family education;Manual techniques;Passive range of motion;Dry needling;Energy conservation;Splinting;Taping    PT Next Visit Plan  manual for edema, manual for soft tissue restrictions, initiate BLE and functional strengthening to tolerance, mobility work to improve ROM    PT Home Exercise Plan  2/20: quad sets, heel slides, standing calf stretcha at wall        Patient will benefit from skilled therapeutic intervention in order to improve the following deficits and impairments:  Abnormal gait, Decreased activity tolerance, Decreased balance, Decreased endurance, Decreased range of motion, Decreased strength, Difficulty walking, Hypomobility, Increased edema, Increased fascial restricitons, Increased muscle spasms, Impaired flexibility, Improper body mechanics, Pain  Visit Diagnosis: Stiffness of right knee, not elsewhere classified  Localized edema  Muscle weakness (generalized)     Problem List Patient Active Problem List   Diagnosis Date Noted  . S/P right knee arthroscopy 04/25/17 05/01/2017  . Vasomotor flushing 08/30/2014  . Pain in joint, shoulder region 12/14/2013  . Muscle weakness (generalized) 12/14/2013  . Decreased range of motion of right shoulder 12/14/2013   Ihor Austin, LPTA; [place]  Aldona Lento 05/13/2017, 12:44 PM  [place] [address] [place], [place], [address] Phone: [phone]   Fax:  [phone]  Name: MARILU RYLANDER MRN: [id number] Date of Birth: 12/07/1960

## 2017-05-15 ENCOUNTER — Encounter (HOSPITAL_COMMUNITY): Payer: Self-pay

## 2017-05-15 ENCOUNTER — Ambulatory Visit (HOSPITAL_COMMUNITY): Payer: 59

## 2017-05-15 DIAGNOSIS — R600 Localized edema: Secondary | ICD-10-CM | POA: Diagnosis not present

## 2017-05-15 DIAGNOSIS — M25661 Stiffness of right knee, not elsewhere classified: Secondary | ICD-10-CM

## 2017-05-15 DIAGNOSIS — M6281 Muscle weakness (generalized): Secondary | ICD-10-CM

## 2017-05-15 NOTE — Therapy (Signed)
[place], [place], [address] Phone: [phone]   Fax:  [phone]  Physical Therapy Treatment  Patient Details  Name: Natalie Hess MRN: [id number] Date of Birth: 1960-05-20 Referring Provider: Arther Abbott, MD   Encounter Date: 05/15/2017  PT End of Session - 05/15/17 1348    Visit Number  3    Number of Visits  13    Date for PT Re-Evaluation  05/28/17    Authorization Type  [facility]    Authorization Time Period  05/07/17 to 06/18/17    PT Start Time  1346    PT Stop Time  1425    PT Time Calculation (min)  39 min    Activity Tolerance  Patient tolerated treatment well    Behavior During Therapy  [facility] for tasks assessed/performed       Past Medical History:  Diagnosis Date  . Medical history non-contributory   . PONV (postoperative nausea and vomiting)     Past Surgical History:  Procedure Laterality Date  . ABDOMINAL HYSTERECTOMY    . CESAREAN SECTION     X 2  . COLONOSCOPY N/A 02/17/2015   Procedure: COLONOSCOPY;  Surgeon: Rogene Houston, MD;  [facility];  Service: Endoscopy;  Laterality: N/A;  930  . KNEE ARTHROSCOPY WITH MEDIAL MENISECTOMY Right 04/24/2017   Procedure: KNEE ARTHROSCOPY WITH PARTIAL MEDIAL MENISECTOMY; ACL DEBRIEDMENT;  Surgeon: Carole Civil, MD;  Location: [facility];  Service: Orthopedics;  Laterality: Right;    There were no vitals filed for this visit.  Subjective Assessment - 05/15/17 1350    Subjective  Pt states that her knee is diong well, she probably overdid stuff this morning.     Patient Stated Goals  not have anymore surgery    Currently in Pain?  No/denies          [facility] Adult PT Treatment/Exercise - 05/15/17 0001      Knee/Hip Exercises: Standing   Heel Raises  15 reps    Heel Raises Limitations  heel and toe    Forward Step Up  Right;2 sets;10 reps    Gait Training  x 2 laps around gym (noted to have R trunk lean and decr knee  flexion during gait)      Knee/Hip Exercises: Supine   Quad Sets  Right;15 reps;Limitations    Quad Sets Limitations  5" holds    Short Arc Quad Sets  Right;15 reps;Limitations    Short Arc Quad Sets Limitations  basketball, 5" holds    Heel Slides  Right;15 reps    Bridges  15 reps    Straight Leg Raises  Right;15 reps    Knee Extension Limitations  0    Knee Flexion Limitations  122      Manual Therapy   Manual Therapy  Edema management;Soft tissue mobilization    Manual therapy comments  Manual complete separate than rest of tx    Edema Management  Retro massage with LE elevated    Soft tissue mobilization  cross friction massage to R patellar tendon            PT Short Term Goals - 05/07/17 2033      PT SHORT TERM GOAL #1   Title  Pt will be independent with HEP and perform consistently in order to maximize ROM and return to PLOF.    Time  3    Period  Weeks  Status  New    Target Date  05/28/17      PT SHORT TERM GOAL #2   Title  Pt will have improved R knee flexion AROM to 120 deg or > to maximize gait, stair ambulation, and return to PLOF.    Time  3    Period  Weeks    Status  New      PT SHORT TERM GOAL #3   Title  Pt will be able to perform bil SLS for 30 sec or > with min to no sway to demo improved balance and functional strength in order to maximize gait.     Time  3    Period  Weeks    Status  New      PT SHORT TERM GOAL #4   Title  Pt will have decreased joint line edema of RLE by 1" to be symmetrical with LLE in order to maximize ROM.    Time  3    Period  Weeks    Status  New        PT Long Term Goals - 05/07/17 2039      PT LONG TERM GOAL #1   Title  Pt will have at least 4+/5 MMT throughout all muscle groups tested in order to maximize gait, balance, and promote return to PLOF.    Time  6    Period  Weeks    Status  New    Target Date  06/18/17      PT LONG TERM GOAL #2   Title  Pt will be able to ambulate at least 723ft in 3MWT  with min to no gait deviations in order to maximize community ambulation, demonstrate improved functional strength, and promote return to PLOF.    Time  6    Period  Weeks    Status  New      PT LONG TERM GOAL #3   Title  Pt will be able to perform 5xSTS in 10 sec or < with proper mechanics to demonstrate improved functional BLE strength.     Time  6    Period  Weeks    Status  New      PT LONG TERM GOAL #4   Title  If cleared by MD, pt will report being able to work for 4 hours or > with min to no knee pain to demonstrate return to PLOF.     Time  6    Period  Weeks    Status  New      PT LONG TERM GOAL #5   Title  If cleared by MD, pt will be able to return to her regular exercise routine at least 3x/week to demonstrate return to PLOF and promote self-maintence at home.    Time  6    Period  Weeks    Status  New            Plan - 05/15/17 1428    Clinical Impression Statement  Continued with established POC focusing on strength, patellar mobs, and knee mobility. Pt continues to reports some pain at anterior knee/patellar tendon. Pt with palpable knot in patellar tendon with tenderness to palpation reported; no change in symptoms following manual. Added step ups this date; pt with noted hip weakness and stability deficits AEB hip IR/knee valgus during; cues pt to keep knee to outside of cone during 2nd set with improvements noted. Updated HEP next visit, add bike for mobility, and progress  pt's standing therex.     Rehab Potential  Good    PT Frequency  2x / week    PT Duration  6 weeks    PT Treatment/Interventions  ADLs/Self Care Home Management;Cryotherapy;Electrical Stimulation;Moist Heat;Ultrasound;DME Instruction;Gait training;Stair training;Functional mobility training;Therapeutic activities;Therapeutic exercise;Balance training;Neuromuscular re-education;Patient/family education;Manual techniques;Passive range of motion;Dry needling;Energy conservation;Splinting;Taping     PT Next Visit Plan  update HEp; add bike, continue step ups, add STS and squats; manual for edema, manual for soft tissue restrictions, initiate BLE and functional strengthening to tolerance, mobility work to improve ROM    PT Home Exercise Plan  2/20: quad sets, heel slides, standing calf stretcha at wall    Consulted and Agree with Plan of Care  Patient       Patient will benefit from skilled therapeutic intervention in order to improve the following deficits and impairments:  Abnormal gait, Decreased activity tolerance, Decreased balance, Decreased endurance, Decreased range of motion, Decreased strength, Difficulty walking, Hypomobility, Increased edema, Increased fascial restricitons, Increased muscle spasms, Impaired flexibility, Improper body mechanics, Pain  Visit Diagnosis: Stiffness of right knee, not elsewhere classified  Localized edema  Muscle weakness (generalized)     Problem List Patient Active Problem List   Diagnosis Date Noted  . S/P right knee arthroscopy 04/25/17 05/01/2017  . Vasomotor flushing 08/30/2014  . Pain in joint, shoulder region 12/14/2013  . Muscle weakness (generalized) 12/14/2013  . Decreased range of motion of right shoulder 12/14/2013       Geraldine Solar PT, DPT  [place] [address] [place], [place], [address] Phone: [phone]   Fax:  [phone]  Name: Natalie Hess MRN: [id number] Date of Birth: [birth year]

## 2017-05-20 ENCOUNTER — Ambulatory Visit (HOSPITAL_COMMUNITY): Payer: 59 | Attending: Orthopedic Surgery

## 2017-05-20 ENCOUNTER — Encounter (HOSPITAL_COMMUNITY): Payer: Self-pay

## 2017-05-20 DIAGNOSIS — M25661 Stiffness of right knee, not elsewhere classified: Secondary | ICD-10-CM | POA: Diagnosis not present

## 2017-05-20 DIAGNOSIS — M6281 Muscle weakness (generalized): Secondary | ICD-10-CM | POA: Insufficient documentation

## 2017-05-20 DIAGNOSIS — R600 Localized edema: Secondary | ICD-10-CM | POA: Insufficient documentation

## 2017-05-20 NOTE — Therapy (Signed)
[place], [place], [address] Phone: [phone]   Fax:  [phone]  Physical Therapy Treatment  Patient Details  Name: Natalie Hess MRN: [id number] Date of Birth: 06-06-1960 Referring Provider: Arther Abbott, MD   Encounter Date: 05/20/2017  PT End of Session - 05/20/17 1030    Visit Number  4    Number of Visits  13    Date for PT Re-Evaluation  05/28/17    Authorization Type  [facility]    Authorization Time Period  05/07/17 to 06/18/17    PT Start Time  1027    PT Stop Time  1115    PT Time Calculation (min)  48 min    Activity Tolerance  Patient tolerated treatment well    Behavior During Therapy  [facility] for tasks assessed/performed       Past Medical History:  Diagnosis Date  . Medical history non-contributory   . PONV (postoperative nausea and vomiting)     Past Surgical History:  Procedure Laterality Date  . ABDOMINAL HYSTERECTOMY    . CESAREAN SECTION     X 2  . COLONOSCOPY N/A 02/17/2015   Procedure: COLONOSCOPY;  Surgeon: Rogene Houston, MD;  [facility];  Service: Endoscopy;  Laterality: N/A;  930  . KNEE ARTHROSCOPY WITH MEDIAL MENISECTOMY Right 04/24/2017   Procedure: KNEE ARTHROSCOPY WITH PARTIAL MEDIAL MENISECTOMY; ACL DEBRIEDMENT;  Surgeon: Carole Civil, MD;  Location: [facility];  Service: Orthopedics;  Laterality: Right;    There were no vitals filed for this visit.  Subjective Assessment - 05/20/17 1030    Subjective  Pt stated her knee is feeling good today, no reports of pain unless she overdoes it.  Returns to MD 3/11 and RTW 3/12.      Patient Stated Goals  not have anymore surgery    Currently in Pain?  No/denies         [facility] PT Assessment - 05/20/17 0001      Assessment   Medical Diagnosis  S/P right knee arthroscopy    Referring Provider  Arther Abbott, MD    Onset Date/Surgical Date  04/24/17    Next MD Visit  05/26/17    Prior Therapy  some on  shoulder but none for present issue      Precautions   Precaution Comments  Hinged knee brace when OOB (does not sleep in it) - Dr. Aline Brochure told her to wear the hinged brace every time she is on it; when she f/u with him he will determine if/when she can stop wearing it                  [facility] Adult PT Treatment/Exercise - 05/20/17 0001      Knee/Hip Exercises: Aerobic   Stationary Bike  3' for mobility seat 9      Knee/Hip Exercises: Standing   Heel Raises  20 reps    Heel Raises Limitations  heel and toe    Lateral Step Up  Right;2 sets;10 reps cueing for mechanics    Forward Step Up  Right;15 reps;Hand Hold: 0;Step Height: 6"    Functional Squat  10 reps    Rocker Board  2 minutes Lateral and DF/Pf    SLS  Lt 60", Rt 60" 1st attempt    SLS with Vectors  next session    Gait Training  2RT cueing for heel strike (noted decreased knee extension with heel strike (  pt apprehensive with extension)      Knee/Hip Exercises: Seated   Sit to Sand  10 reps;without UE support Cueing for equal weight bearing      Knee/Hip Exercises: Supine   Short Arc Quad Sets  Right;15 reps;Limitations    Short Arc Quad Sets Limitations  5" holds with 2#    Bridges  15 reps    Knee Extension  AROM    Knee Extension Limitations  0    Knee Flexion  AROM    Knee Flexion Limitations  124      Manual Therapy   Manual Therapy  Edema management;Soft tissue mobilization    Manual therapy comments  Manual complete separate than rest of tx    Edema Management  Retro massage with LE elevated    Soft tissue mobilization  cross friction massage to R patellar tendon                PT Short Term Goals - 05/07/17 2033      PT SHORT TERM GOAL #1   Title  Pt will be independent with HEP and perform consistently in order to maximize ROM and return to PLOF.    Time  3    Period  Weeks    Status  New    Target Date  05/28/17      PT SHORT TERM GOAL #2   Title  Pt will have improved R knee  flexion AROM to 120 deg or > to maximize gait, stair ambulation, and return to PLOF.    Time  3    Period  Weeks    Status  New      PT SHORT TERM GOAL #3   Title  Pt will be able to perform bil SLS for 30 sec or > with min to no sway to demo improved balance and functional strength in order to maximize gait.     Time  3    Period  Weeks    Status  New      PT SHORT TERM GOAL #4   Title  Pt will have decreased joint line edema of RLE by 1" to be symmetrical with LLE in order to maximize ROM.    Time  3    Period  Weeks    Status  New        PT Long Term Goals - 05/07/17 2039      PT LONG TERM GOAL #1   Title  Pt will have at least 4+/5 MMT throughout all muscle groups tested in order to maximize gait, balance, and promote return to PLOF.    Time  6    Period  Weeks    Status  New    Target Date  06/18/17      PT LONG TERM GOAL #2   Title  Pt will be able to ambulate at least 777ft in 3MWT with min to no gait deviations in order to maximize community ambulation, demonstrate improved functional strength, and promote return to PLOF.    Time  6    Period  Weeks    Status  New      PT LONG TERM GOAL #3   Title  Pt will be able to perform 5xSTS in 10 sec or < with proper mechanics to demonstrate improved functional BLE strength.     Time  6    Period  Weeks    Status  New      PT LONG  TERM GOAL #4   Title  If cleared by MD, pt will report being able to work for 4 hours or > with min to no knee pain to demonstrate return to PLOF.     Time  6    Period  Weeks    Status  New      PT LONG TERM GOAL #5   Title  If cleared by MD, pt will be able to return to her regular exercise routine at least 3x/week to demonstrate return to PLOF and promote self-maintence at home.    Time  6    Period  Weeks    Status  New            Plan - 05/20/17 1144    Clinical Impression Statement  Began session with bike for mobility then reviewed gait mechanics.  Pt with tendency to rotate  trunk to right and lacking extension with heel strike.  Reviewed gait mechanics and began rocker board to improve weight distribution and mechanics.  Progressed to functional strengthening with CKC exercises.  Added lateral step up for quad strengthening and STS/squats for functional strengthening, demonstration and verbal cueing to improve mechanics with new exercises.  Pt given additional HEP to improve gastroc strengthening wiht gait mechanics and functional strengthening with sit to stands.  EOS with manual retro massage to address edema proximal knee.  No reports of pain through session.      Rehab Potential  Good    PT Frequency  2x / week    PT Duration  6 weeks    PT Treatment/Interventions  ADLs/Self Care Home Management;Cryotherapy;Electrical Stimulation;Moist Heat;Ultrasound;DME Instruction;Gait training;Stair training;Functional mobility training;Therapeutic activities;Therapeutic exercise;Balance training;Neuromuscular re-education;Patient/family education;Manual techniques;Passive range of motion;Dry needling;Energy conservation;Splinting;Taping    PT Next Visit Plan  Continue wiht bike for mobility and progressed functional strengthening.  Add vector stance for hip stability, continue squats and progress step ups.  Manual for soft tissue restrictions.    PT Home Exercise Plan  2/20: quad sets, heel slides, standing calf stretcha at wall; 05/20/17: heel/toe raises and STS       Patient will benefit from skilled therapeutic intervention in order to improve the following deficits and impairments:  Abnormal gait, Decreased activity tolerance, Decreased balance, Decreased endurance, Decreased range of motion, Decreased strength, Difficulty walking, Hypomobility, Increased edema, Increased fascial restricitons, Increased muscle spasms, Impaired flexibility, Improper body mechanics, Pain  Visit Diagnosis: Stiffness of right knee, not elsewhere classified  Localized edema  Muscle weakness  (generalized)     Problem List Patient Active Problem List   Diagnosis Date Noted  . S/P right knee arthroscopy 04/25/17 05/01/2017  . Vasomotor flushing 08/30/2014  . Pain in joint, shoulder region 12/14/2013  . Muscle weakness (generalized) 12/14/2013  . Decreased range of motion of right shoulder 12/14/2013   Ihor Austin, LPTA; [place]  Aldona Lento 05/20/2017, 12:56 PM  [facility] [place] [address] [place], [place], [address] Phone: [phone]   Fax:  [phone]  Name: Natalie Hess MRN: [id number] Date of Birth: [birth year]

## 2017-05-20 NOTE — Patient Instructions (Signed)
Toe / Heel Raise (Standing)    Standing with support, raise heels, then rock back on heels and raise toes. Repeat 20 times.  Copyright  VHI. All rights reserved.   Functional Quadriceps: Sit to Stand    Sit on edge of chair, feet flat on floor. Stand upright, extending knees fully. Repeat 10 times per set. Do 1-2 sets per session.   http://orth.exer.us/735   Copyright  VHI. All rights reserved.

## 2017-05-23 ENCOUNTER — Ambulatory Visit (HOSPITAL_COMMUNITY): Payer: 59 | Admitting: Physical Therapy

## 2017-05-23 ENCOUNTER — Encounter (HOSPITAL_COMMUNITY): Payer: Self-pay | Admitting: Physical Therapy

## 2017-05-23 DIAGNOSIS — R600 Localized edema: Secondary | ICD-10-CM

## 2017-05-23 DIAGNOSIS — M25661 Stiffness of right knee, not elsewhere classified: Secondary | ICD-10-CM

## 2017-05-23 DIAGNOSIS — M6281 Muscle weakness (generalized): Secondary | ICD-10-CM | POA: Diagnosis not present

## 2017-05-23 NOTE — Therapy (Signed)
[place], [place], [address] Phone: [phone]   Fax:  [phone]  Physical Therapy Treatment  Patient Details  Name: Natalie Hess MRN: [id number] Date of Birth: 11/01/1960 Referring Provider: Arther Abbott, MD   Encounter Date: 05/23/2017   Right knee flexion AROM: 124 Right knee extension AROM: 0 Ambulation distance on 3MWT: 698 feet  PT End of Session - 05/23/17 1206    Visit Number  5    Number of Visits  13    Date for PT Re-Evaluation  05/28/17    Authorization Type  [facility]    Authorization Time Period  05/07/17 to 06/18/17    PT Start Time  1116    PT Stop Time  1203    PT Time Calculation (min)  47 min    Activity Tolerance  Patient tolerated treatment well    Behavior During Therapy  [facility] for tasks assessed/performed       Past Medical History:  Diagnosis Date  . Medical history non-contributory   . PONV (postoperative nausea and vomiting)     Past Surgical History:  Procedure Laterality Date  . ABDOMINAL HYSTERECTOMY    . CESAREAN SECTION     X 2  . COLONOSCOPY N/A 02/17/2015   Procedure: COLONOSCOPY;  Surgeon: Rogene Houston, MD;  [facility];  Service: Endoscopy;  Laterality: N/A;  930  . KNEE ARTHROSCOPY WITH MEDIAL MENISECTOMY Right 04/24/2017   Procedure: KNEE ARTHROSCOPY WITH PARTIAL MEDIAL MENISECTOMY; ACL DEBRIEDMENT;  Surgeon: Carole Civil, MD;  Location: [facility];  Service: Orthopedics;  Laterality: Right;    There were no vitals filed for this visit.  Subjective Assessment - 05/23/17 1126    Subjective  Patient stated that she is feeling good and that she has been doing her exercises at home. Patient stated she will being going to physician on 3/11.     Limitations  Standing;Walking    How long can you sit comfortably?  no issues    How long can you stand comfortably?  An hour    How long can you walk comfortably?  3 hours    Patient Stated Goals  not  have anymore surgery    Currently in Pain?  No/denies         [facility] PT Assessment - 05/23/17 0001      AROM   Right Knee Extension  0    Right Knee Flexion  120      Strength   Right Hip Flexion  4+/5    Right Hip Extension  4/5    Right Hip ABduction  4+/5    Left Hip Flexion  5/5    Left Hip Extension  4-/5    Left Hip ABduction  4+/5    Right Knee Flexion  4+/5    Right Knee Extension  4/5    Left Knee Flexion  5/5    Left Knee Extension  5/5    Right Ankle Dorsiflexion  4+/5    Left Ankle Dorsiflexion  5/5      Ambulation/Gait   Ambulation Distance (Feet)  698 Feet 3MWT    Gait Pattern  Step-through pattern;Decreased stance time - right;Decreased step length - left;Decreased hip/knee flexion - right                  [facility] Adult PT Treatment/Exercise - 05/23/17 0001      Knee/Hip Exercises: Standing   Heel Raises  20 reps    Heel Raises Limitations  heel and toe    Lateral Step Up  Right;2 sets;10 reps;Other (comment) Demonstration and verbal cues for improved mechanics    Forward Step Up  Right;15 reps;Hand Hold: 0;Step Height: 6"    Functional Squat  15 reps light hand hold assist    Rocker Board  2 minutes;Other (comment) Lateral and PF/DF    SLS  Lt 60", Rt 60" 1st attempt    SLS with Vectors  Forward, side, back, single leg stance with 5 second holds x 3 Rt. and Lt.  Intermittent upper extremity assistance      Knee/Hip Exercises: Seated   Sit to Sand  10 reps;without UE support Cueing for equal weight bearing      Knee/Hip Exercises: Supine   Short Arc Quad Sets  Right;15 reps;Limitations    Short Arc Quad Sets Limitations  5 second holds; 2# ankle weight    Bridges  15 reps    Knee Extension  AROM    Knee Extension Limitations  0    Knee Flexion  AROM    Knee Flexion Limitations  124             PT Education - 05/23/17 1206    Education provided  Yes    Education Details  Patient was educated on her progress and on purpose and  technique of exercises throughout.     Person(s) Educated  Patient    Methods  Explanation;Demonstration    Comprehension  Verbalized understanding       PT Short Term Goals - 05/07/17 2033      PT SHORT TERM GOAL #1   Title  Pt will be independent with HEP and perform consistently in order to maximize ROM and return to PLOF.    Time  3    Period  Weeks    Status  New    Target Date  05/28/17      PT SHORT TERM GOAL #2   Title  Pt will have improved R knee flexion AROM to 120 deg or > to maximize gait, stair ambulation, and return to PLOF.    Time  3    Period  Weeks    Status  New      PT SHORT TERM GOAL #3   Title  Pt will be able to perform bil SLS for 30 sec or > with min to no sway to demo improved balance and functional strength in order to maximize gait.     Time  3    Period  Weeks    Status  New      PT SHORT TERM GOAL #4   Title  Pt will have decreased joint line edema of RLE by 1" to be symmetrical with LLE in order to maximize ROM.    Time  3    Period  Weeks    Status  New        PT Long Term Goals - 05/07/17 2039      PT LONG TERM GOAL #1   Title  Pt will have at least 4+/5 MMT throughout all muscle groups tested in order to maximize gait, balance, and promote return to PLOF.    Time  6    Period  Weeks    Status  New    Target Date  06/18/17      PT LONG TERM GOAL #2   Title  Pt will be able to ambulate at  least 797ft in 3MWT with min to no gait deviations in order to maximize community ambulation, demonstrate improved functional strength, and promote return to PLOF.    Time  6    Period  Weeks    Status  New      PT LONG TERM GOAL #3   Title  Pt will be able to perform 5xSTS in 10 sec or < with proper mechanics to demonstrate improved functional BLE strength.     Time  6    Period  Weeks    Status  New      PT LONG TERM GOAL #4   Title  If cleared by MD, pt will report being able to work for 4 hours or > with min to no knee pain to  demonstrate return to PLOF.     Time  6    Period  Weeks    Status  New      PT LONG TERM GOAL #5   Title  If cleared by MD, pt will be able to return to her regular exercise routine at least 3x/week to demonstrate return to PLOF and promote self-maintence at home.    Time  6    Period  Weeks    Status  New            Plan - 05/23/17 1209    Clinical Impression Statement  This session tested patient's strength and ROM and gait distance as she is returning to her physician next week. Patient demonstrated improvements in strength and with AROM of her right knee as she can now move from 0 degrees of extension to 124 degrees flexion. Patient demonstrated improvement with gait distance this session as well. This session added single leg stance with vectors which patient required intermittent upper extremity support to perform. Patient would benefit from continued skilled physical therapy in order to continue addressing patient's deficits in ROM, strength, and overall functional mobility.     Rehab Potential  Good    PT Frequency  2x / week    PT Duration  6 weeks    PT Treatment/Interventions  ADLs/Self Care Home Management;Cryotherapy;Electrical Stimulation;Moist Heat;Ultrasound;DME Instruction;Gait training;Stair training;Functional mobility training;Therapeutic activities;Therapeutic exercise;Balance training;Neuromuscular re-education;Patient/family education;Manual techniques;Passive range of motion;Dry needling;Energy conservation;Splinting;Taping    PT Next Visit Plan  Continue wiht bike for mobility and progressed functional strengthening.  Continue vector stance for hip stability, continue squats and progress step ups.  Manual for soft tissue restrictions.    PT Home Exercise Plan  2/20: quad sets, heel slides, standing calf stretcha at wall; 05/20/17: heel/toe raises and STS    Consulted and Agree with Plan of Care  Patient       Patient will benefit from skilled therapeutic  intervention in order to improve the following deficits and impairments:  Abnormal gait, Decreased activity tolerance, Decreased balance, Decreased endurance, Decreased range of motion, Decreased strength, Difficulty walking, Hypomobility, Increased edema, Increased fascial restricitons, Increased muscle spasms, Impaired flexibility, Improper body mechanics, Pain  Visit Diagnosis: Stiffness of right knee, not elsewhere classified  Localized edema  Muscle weakness (generalized)     Problem List Patient Active Problem List   Diagnosis Date Noted  . S/P right knee arthroscopy 04/25/17 05/01/2017  . Vasomotor flushing 08/30/2014  . Pain in joint, shoulder region 12/14/2013  . Muscle weakness (generalized) 12/14/2013  . Decreased range of motion of right shoulder 12/14/2013   Clarene Critchley PT, DPT 12:17 PM, 05/23/17 [place], [place], [address] Phone: [phone]   Fax:  [phone]  Name: Natalie Hess MRN: [id number] Date of Birth: 1961/01/27

## 2017-05-25 NOTE — Progress Notes (Signed)
POST OP VISIT   Patient ID: Natalie Hess, female   DOB: 12/07/1960, 57 y.o.   MRN: [id number]  Chief Complaint  Patient presents with  . Post-op Follow-up    right knee arthroscopy 04/24/17    Encounter Diagnosis  Name Primary?  . S/P right knee arthroscopy 04/25/17 Yes   ACL debridement partial medial meniscectomy  She has participated well with physical therapy she does have some anterior knee pain and that is presumably coming from the open chain knee extensions with weights on the foot  Recommend she stop doing those  Her Lockman test side to side is just a trace more than the left her range of motion is 125 degrees her extension is just about full knee lies flat on the table  She can return to work on Wednesday with the brace on follow-up with me in 4 weeks

## 2017-05-26 ENCOUNTER — Ambulatory Visit (INDEPENDENT_AMBULATORY_CARE_PROVIDER_SITE_OTHER): Payer: 59 | Admitting: Orthopedic Surgery

## 2017-05-26 ENCOUNTER — Encounter: Payer: Self-pay | Admitting: Orthopedic Surgery

## 2017-05-26 VITALS — BP 133/79 | HR 75 | Ht 67.0 in | Wt 134.0 lb

## 2017-05-26 DIAGNOSIS — M23321 Other meniscus derangements, posterior horn of medial meniscus, right knee: Secondary | ICD-10-CM

## 2017-05-26 DIAGNOSIS — Z9889 Other specified postprocedural states: Secondary | ICD-10-CM

## 2017-05-26 DIAGNOSIS — S83511D Sprain of anterior cruciate ligament of right knee, subsequent encounter: Secondary | ICD-10-CM

## 2017-05-26 NOTE — Patient Instructions (Signed)
Return to work Wednesday, March 13

## 2017-05-27 ENCOUNTER — Encounter (HOSPITAL_COMMUNITY): Payer: Self-pay

## 2017-05-27 ENCOUNTER — Ambulatory Visit (HOSPITAL_COMMUNITY): Payer: 59

## 2017-05-27 DIAGNOSIS — M6281 Muscle weakness (generalized): Secondary | ICD-10-CM

## 2017-05-27 DIAGNOSIS — M25661 Stiffness of right knee, not elsewhere classified: Secondary | ICD-10-CM | POA: Diagnosis not present

## 2017-05-27 DIAGNOSIS — R600 Localized edema: Secondary | ICD-10-CM | POA: Diagnosis not present

## 2017-05-27 NOTE — Patient Instructions (Signed)
Knee Extension: Terminal - Standing (Single Leg)    Face anchor in shoulder width stance, band around knee. Allow tension of band to slightly bend knee. Pull leg back, straightening knee, hold for 5" Repeat 15-20 times per set. . Do 1-2 sets per session. Anchor Height: Knee  [email or web address].[email or web address]   Copyright  VHI. All rights reserved.

## 2017-05-27 NOTE — Therapy (Signed)
[place], [place], [address] Phone: [phone]   Fax:  [phone]  Physical Therapy Treatment  Patient Details  Name: Natalie Hess MRN: [id number] Date of Birth: 07/26/1960 Referring Provider: Arther Abbott, MD   Encounter Date: 05/27/2017  PT End of Session - 05/27/17 1439    Visit Number  6    Number of Visits  13    Date for PT Re-Evaluation  05/28/17    Authorization Type  [facility]    Authorization Time Period  05/07/17 to 06/18/17    PT Start Time  1435    PT Stop Time  1513    PT Time Calculation (min)  38 min    Activity Tolerance  Patient tolerated treatment well    Behavior During Therapy  [facility] for tasks assessed/performed       Past Medical History:  Diagnosis Date  . Medical history non-contributory   . PONV (postoperative nausea and vomiting)     Past Surgical History:  Procedure Laterality Date  . ABDOMINAL HYSTERECTOMY    . CESAREAN SECTION     X 2  . COLONOSCOPY N/A 02/17/2015   Procedure: COLONOSCOPY;  Surgeon: Rogene Houston, MD;  [facility];  Service: Endoscopy;  Laterality: N/A;  930  . KNEE ARTHROSCOPY WITH MEDIAL MENISECTOMY Right 04/24/2017   Procedure: KNEE ARTHROSCOPY WITH PARTIAL MEDIAL MENISECTOMY; ACL DEBRIEDMENT;  Surgeon: Carole Civil, MD;  Location: [facility];  Service: Orthopedics;  Laterality: Right;    There were no vitals filed for this visit.  Subjective Assessment - 05/27/17 1436    Subjective  Pt arrived with brace on knee, continue to wear for a month unless relaxing around home.  RTW tomorrow.  Current pain scale 2/10    Patient Stated Goals  not have anymore surgery    Currently in Pain?  Yes    Pain Score  2     Pain Location  Knee    Pain Orientation  Right    Pain Descriptors / Indicators  Tender    Pain Onset  More than a month ago    Pain Frequency  Intermittent    Aggravating Factors   step the wrong way    Pain Relieving  Factors  ice    Effect of Pain on Daily Activities  minimal         [facility] PT Assessment - 05/27/17 0001      Assessment   Medical Diagnosis  S/P right knee arthroscopy    Referring Provider  Arther Abbott, MD    Onset Date/Surgical Date  04/24/17    Next MD Visit  4/10    Prior Therapy  some on shoulder but none for present issue      Precautions   Precaution Comments  Hinged knee brace when OOB (does not sleep in it) - Dr. Aline Brochure told her to wear the hinged brace every time she is on it; when she f/u with him he will determine if/when she can stop wearing it      Circumferential Edema   Circumferential - Right  suprapatellar: 14.25" was 15.75" 13.5" joint line; 3" suprapatellar: 15.75"                  [facility] Adult PT Treatment/Exercise - 05/27/17 0001      Knee/Hip Exercises: Standing   Heel Raises  20 reps    Heel Raises Limitations  heel and toe  Terminal Knee Extension  Right;15 reps;Theraband    Theraband Level (Terminal Knee Extension)  Level 3 (Green)    Lateral Step Up  15 reps;Hand Hold: 0;Step Height: 6"    Forward Step Up  Right;15 reps;Hand Hold: 0;Step Height: 6"    Step Down  10 reps;Right;Hand Hold: 0;Step Height: 6"    Functional Squat  15 reps mat tapping cueing for equal weight stance    SLS with Vectors  Rt 5x 5-10" holds on foam      Knee/Hip Exercises: Supine   Knee Extension  AROM    Knee Extension Limitations  0    Knee Flexion  AROM    Knee Flexion Limitations  126               PT Short Term Goals - 05/07/17 2033      PT SHORT TERM GOAL #1   Title  Pt will be independent with HEP and perform consistently in order to maximize ROM and return to PLOF.    Time  3    Period  Weeks    Status  New    Target Date  05/28/17      PT SHORT TERM GOAL #2   Title  Pt will have improved R knee flexion AROM to 120 deg or > to maximize gait, stair ambulation, and return to PLOF.    Time  3    Period  Weeks    Status  New       PT SHORT TERM GOAL #3   Title  Pt will be able to perform bil SLS for 30 sec or > with min to no sway to demo improved balance and functional strength in order to maximize gait.     Time  3    Period  Weeks    Status  New      PT SHORT TERM GOAL #4   Title  Pt will have decreased joint line edema of RLE by 1" to be symmetrical with LLE in order to maximize ROM.    Time  3    Period  Weeks    Status  New        PT Long Term Goals - 05/07/17 2039      PT LONG TERM GOAL #1   Title  Pt will have at least 4+/5 MMT throughout all muscle groups tested in order to maximize gait, balance, and promote return to PLOF.    Time  6    Period  Weeks    Status  New    Target Date  06/18/17      PT LONG TERM GOAL #2   Title  Pt will be able to ambulate at least 714ft in 3MWT with min to no gait deviations in order to maximize community ambulation, demonstrate improved functional strength, and promote return to PLOF.    Time  6    Period  Weeks    Status  New      PT LONG TERM GOAL #3   Title  Pt will be able to perform 5xSTS in 10 sec or < with proper mechanics to demonstrate improved functional BLE strength.     Time  6    Period  Weeks    Status  New      PT LONG TERM GOAL #4   Title  If cleared by MD, pt will report being able to work for 4 hours or > with min to no knee pain  to demonstrate return to PLOF.     Time  6    Period  Weeks    Status  New      PT LONG TERM GOAL #5   Title  If cleared by MD, pt will be able to return to her regular exercise routine at least 3x/week to demonstrate return to PLOF and promote self-maintence at home.    Time  6    Period  Weeks    Status  New            Plan - 05/27/17 1511    Clinical Impression Statement  Pt progressing well towards goals with AROM 0-126 degrees and no reports of pain at EOS.  Continued with brace on knee per MD, did message MD asking when able to complete PT session without brace.  Progressed functional  strengthening wiht additional step downs for eccentric quad strengthening.  No reports of increased pain through session.      Rehab Potential  Good    PT Frequency  2x / week    PT Duration  6 weeks    PT Treatment/Interventions  ADLs/Self Care Home Management;Cryotherapy;Electrical Stimulation;Moist Heat;Ultrasound;DME Instruction;Gait training;Stair training;Functional mobility training;Therapeutic activities;Therapeutic exercise;Balance training;Neuromuscular re-education;Patient/family education;Manual techniques;Passive range of motion;Dry needling;Energy conservation;Splinting;Taping    PT Next Visit Plan  Continue wiht mobility and functional strengthening.  Continue vector stance, squats and step training.  Manual for soft tissue restrictions.     PT Home Exercise Plan  2/20: quad sets, heel slides, standing calf stretcha at wall; 05/20/17: heel/toe raises and STS; 3/12: TKE       Patient will benefit from skilled therapeutic intervention in order to improve the following deficits and impairments:  Abnormal gait, Decreased activity tolerance, Decreased balance, Decreased endurance, Decreased range of motion, Decreased strength, Difficulty walking, Hypomobility, Increased edema, Increased fascial restricitons, Increased muscle spasms, Impaired flexibility, Improper body mechanics, Pain  Visit Diagnosis: Stiffness of right knee, not elsewhere classified  Localized edema  Muscle weakness (generalized)     Problem List Patient Active Problem List   Diagnosis Date Noted  . S/P right knee arthroscopy 04/25/17 05/01/2017  . Vasomotor flushing 08/30/2014  . Pain in joint, shoulder region 12/14/2013  . Muscle weakness (generalized) 12/14/2013  . Decreased range of motion of right shoulder 12/14/2013   Ihor Austin, LPTA; [place]  Aldona Lento 05/27/2017, 3:33 PM  [facility] [address] [place], [place],  [address] Phone: [phone]   Fax:  [phone]  Name: Natalie Hess MRN: [id number] Date of Birth: 1960-10-25

## 2017-05-29 ENCOUNTER — Encounter (HOSPITAL_COMMUNITY): Payer: Self-pay

## 2017-05-29 ENCOUNTER — Ambulatory Visit (HOSPITAL_COMMUNITY): Payer: 59

## 2017-05-29 DIAGNOSIS — R600 Localized edema: Secondary | ICD-10-CM | POA: Diagnosis not present

## 2017-05-29 DIAGNOSIS — M6281 Muscle weakness (generalized): Secondary | ICD-10-CM

## 2017-05-29 DIAGNOSIS — M25661 Stiffness of right knee, not elsewhere classified: Secondary | ICD-10-CM

## 2017-05-29 NOTE — Patient Instructions (Signed)
FUNCTIONAL MOBILITY: Squat    Stance: shoulder-width on floor. Bend hips and knees. Keep back straight. Do not allow knees to bend past toes. Squeeze glutes and quads to stand. 10-20 reps per set, 1-2 sets per day.  Copyright  VHI. All rights reserved.   Climbing Stairs    When climbing stairs, breathe in with first step, breathe out through pursed lips with two or three steps. If you are more short of breath than usual: Breathe in while standing still. Breathe out through pursed lips while stepping up. Stop, then repeat with each step. Take only one step at a time. Do not hold your breath when climbing steps.  Copyright  VHI. All rights reserved.

## 2017-05-29 NOTE — Therapy (Signed)
[place], [place], [address] Phone: [phone]   Fax:  [phone]    I have reviewed the note below and agree with its findings.  Geraldine Solar PT, DPT   Physical Therapy Treatment  Patient Details  Name: Natalie Hess MRN: [id number] Date of Birth: 1960/10/16 Referring Provider: Arther Abbott, MD   Encounter Date: 05/29/2017  PT End of Session - 05/29/17 1604    Visit Number  7    Number of Visits  13    Date for PT Re-Evaluation  06/18/17    Authorization Type  [facility]    Authorization Time Period  05/07/17 to 06/18/17    PT Start Time  1605    PT Stop Time  1646    PT Time Calculation (min)  41 min    Activity Tolerance  Patient tolerated treatment well    Behavior During Therapy  [facility] for tasks assessed/performed       Past Medical History:  Diagnosis Date  . Medical history non-contributory   . PONV (postoperative nausea and vomiting)     Past Surgical History:  Procedure Laterality Date  . ABDOMINAL HYSTERECTOMY    . CESAREAN SECTION     X 2  . COLONOSCOPY N/A 02/17/2015   Procedure: COLONOSCOPY;  Surgeon: Rogene Houston, MD;  [facility];  Service: Endoscopy;  Laterality: N/A;  930  . KNEE ARTHROSCOPY WITH MEDIAL MENISECTOMY Right 04/24/2017   Procedure: KNEE ARTHROSCOPY WITH PARTIAL MEDIAL MENISECTOMY; ACL DEBRIEDMENT;  Surgeon: Carole Civil, MD;  Location: [facility];  Service: Orthopedics;  Laterality: Right;    There were no vitals filed for this visit.  Subjective Assessment - 05/29/17 1606    Subjective  Pt RTW yesterday for 8 hours with increased soreness in feet and hips.  No reoprts of pain in knee today.      How long can you sit comfortably?  no issues    How long can you stand comfortably?  Able to stand comfortably for 3 hours (was an hour)    How long can you walk comfortably?  Able to walk 3 hours prior fatigue (3 hours)    Patient Stated Goals  not  have anymore surgery    Currently in Pain?  No/denies         [facility] PT Assessment - 05/29/17 0001      Assessment   Medical Diagnosis  S/P right knee arthroscopy    Referring Provider  Arther Abbott, MD    Onset Date/Surgical Date  04/24/17    Next MD Visit  4/10    Prior Therapy  some on shoulder but none for present issue      Precautions   Precaution Comments  Hinged knee brace when OOB (does not sleep in it) - Dr. Aline Brochure told her to wear the hinged brace every time she is on it; when she f/u with him he will determine if/when she can stop wearing it      Circumferential Edema   Circumferential - Right  joint line 13.3"; suprapatellar: 16" suprapatellar: 14.25" was 15.75"    Circumferential - Left   joint line 13.3"; suprapatellar 16.25" 12.5" joint line; 3" suprapatellar:  15.75"      AROM   Right/Left Knee  Right    Right Knee Extension  0    Right Knee Flexion  125 was 120      Strength   Strength Assessment  Site  Hip;Knee    Right Hip Flexion  5/5 was 4+/5    Right Hip Extension  4/5 was 4/5    Right Hip ABduction  4+/5 was 4+/5    Left Hip Flexion  5/5    Left Hip Extension  4/5 was 4-/5    Left Hip ABduction  4+/5 was 4+/5    Right Knee Flexion  4+/5 was 4+/5    Right Knee Extension  4+/5 was 4/5    Left Knee Flexion  5/5    Left Knee Extension  5/5    Right Ankle Dorsiflexion  -- was 4+/5    Left Ankle Dorsiflexion  -- 5      Flexibility   Hamstrings  R tighter than L and more guarded, but HS length WFL                  [facility] Adult PT Treatment/Exercise - 05/29/17 0001      Knee/Hip Exercises: Standing   Heel Raises  20 reps    Heel Raises Limitations  heel and toe    Step Down  15 reps    Functional Squat  15 reps;2 sets mat tapping    Gait Training  6MWT 789 feet      Knee/Hip Exercises: Seated   Sit to Sand  2 sets;5 reps;without UE support good weight bearing following cueing 5STs 870"      Knee/Hip Exercises: Supine   Heel  Slides  10 reps    Knee Extension  AROM    Knee Extension Limitations  0    Knee Flexion  AROM    Knee Flexion Limitations  125             PT Education - 05/29/17 1754    Education provided  Yes    Education Details  Pt educated on benefits of compression hose for edema control with RTW, measurements taken and paperwork given    Person(s) Educated  Patient    Methods  Explanation;Handout    Comprehension  Verbalized understanding       PT Short Term Goals - 05/29/17 1609      PT SHORT TERM GOAL #1   Title  Pt will be independent with HEP and perform consistently in order to maximize ROM and return to PLOF.    Baseline  05/29/17:  Reports complaince with HEP multiple times a day    Status  Achieved      PT SHORT TERM GOAL #2   Title  Pt will have improved R knee flexion AROM to 120 deg or > to maximize gait, stair ambulation, and return to PLOF.    Baseline  05/29/17:  AROM 0-125 degrees    Status  Achieved      PT SHORT TERM GOAL #3   Title  Pt will be able to perform bil SLS for 30 sec or > with min to no sway to demo improved balance and functional strength in order to maximize gait.     Baseline  03/14: Able to SLS 60" on firm and dynamic surfaced with minimal sway    Status  Achieved      PT SHORT TERM GOAL #4   Title  Pt will have decreased joint line edema of RLE by 1" to be symmetrical with LLE in order to maximize ROM.    Baseline  03/14: Presents with increased edema with re-assessment on 2nd day of RTW.  Pt educated on benefits of compression hose  Status  On-going        PT Long Term Goals - 05/29/17 1614      PT LONG TERM GOAL #1   Title  Pt will have at least 4+/5 MMT throughout all muscle groups tested in order to maximize gait, balance, and promote return to PLOF.    Baseline  03/14: see MMT    Status  Achieved      PT LONG TERM GOAL #2   Title  Pt will be able to ambulate at least 711f in 3MWT with min to no gait deviations in order to  maximize community ambulation, demonstrate improved functional strength, and promote return to PLOF.    Baseline  03/14:  789 feet in  3MWT    Status  Achieved      PT LONG TERM GOAL #3   Title  Pt will be able to perform 5xSTS in 10 sec or < with proper mechanics to demonstrate improved functional BLE strength.     Baseline  05/29/17:  5 STS in 8.7" no HHA and equal weight distrubtion    Status  Achieved      PT LONG TERM GOAL #4   Title  If cleared by MD, pt will report being able to work for 4 hours or > with min to no knee pain to demonstrate return to PLOF.     Baseline  05/29/2017:  Reports RTW full time per MD, increased edema and pain    Status  On-going      PT LONG TERM GOAL #5   Title  If cleared by MD, pt will be able to return to her regular exercise routine at least 3x/week to demonstrate return to PLOF and promote self-maintence at home.    Baseline  Not cleared by MD yet    Status  Not Met            Plan - 05/29/17 1650    Clinical Impression Statement  [place] reassessment complete this session.  Pt progressing well with improved knee mobilty, functional strength and improved time wiht STS and 3MWT.  Pt has met 3/4 STG and 3/5 LTGs. Pt has RTW yesterday per MD and presents with increased edema.  Pt educated on benefits with compression hose for edema control with RTW to assist with pain control, measurements taken and paperwork given to pt.  Pt will continue to benefit with therapy to improve functional strengthening, edema control and return to PLOF once clearned by MD for return to gym.      Rehab Potential  Good    PT Frequency  2x / week    PT Duration  6 weeks    PT Treatment/Interventions  ADLs/Self Care Home Management;Cryotherapy;Electrical Stimulation;Moist Heat;Ultrasound;DME Instruction;Gait training;Stair training;Functional mobility training;Therapeutic activities;Therapeutic exercise;Balance training;Neuromuscular re-education;Patient/family  education;Manual techniques;Passive range of motion;Dry needling;Energy conservation;Splinting;Taping    PT Next Visit Plan  f/u with compression hose for edema control.  Progress functional strengthening with increased focus on hip extension.  Add lunges next session.    PT Home Exercise Plan  2/20: quad sets, heel slides, standing calf stretcha at wall; 05/20/17: heel/toe raises and STS; 3/12: TKE; 05/29/17: squats and stairs       Patient will benefit from skilled therapeutic intervention in order to improve the following deficits and impairments:  Abnormal gait, Decreased activity tolerance, Decreased balance, Decreased endurance, Decreased range of motion, Decreased strength, Difficulty walking, Hypomobility, Increased edema, Increased fascial restricitons, Increased muscle spasms, Impaired flexibility, Improper body mechanics, Pain  Visit Diagnosis: Stiffness of right knee, not elsewhere classified  Localized edema  Muscle weakness (generalized)     Problem List Patient Active Problem List   Diagnosis Date Noted  . S/P right knee arthroscopy 04/25/17 05/01/2017  . Vasomotor flushing 08/30/2014  . Pain in joint, shoulder region 12/14/2013  . Muscle weakness (generalized) 12/14/2013  . Decreased range of motion of right shoulder 12/14/2013   Ihor Austin, LPTA; CBIS [phone]  Aldona Lento 05/29/2017, 5:55 PM  [place] [address] [place], [place], [address] Phone: [phone]   Fax:  [phone]  Name: Natalie Hess MRN: [id number] Date of Birth: [birth year]

## 2017-06-03 ENCOUNTER — Ambulatory Visit (HOSPITAL_COMMUNITY): Payer: 59

## 2017-06-03 DIAGNOSIS — M6281 Muscle weakness (generalized): Secondary | ICD-10-CM | POA: Diagnosis not present

## 2017-06-03 DIAGNOSIS — M25661 Stiffness of right knee, not elsewhere classified: Secondary | ICD-10-CM | POA: Diagnosis not present

## 2017-06-03 DIAGNOSIS — R600 Localized edema: Secondary | ICD-10-CM

## 2017-06-03 NOTE — Therapy (Signed)
[place], [place], [address] Phone: [phone]   Fax:  [phone]  Physical Therapy Treatment  Patient Details  Name: Natalie Hess MRN: [id number] Date of Birth: Mar 21, 1960 Referring Provider: Arther Abbott, MD   Encounter Date: 06/03/2017  PT End of Session - 06/03/17 1352    Visit Number  8    Number of Visits  13    Date for PT Re-Evaluation  06/18/17    Authorization Type  [facility]    Authorization Time Period  05/07/17 to 06/18/17    PT Start Time  1350    PT Stop Time  1430    PT Time Calculation (min)  40 min    Activity Tolerance  Patient tolerated treatment well    Behavior During Therapy  [facility] for tasks assessed/performed       Past Medical History:  Diagnosis Date  . Medical history non-contributory   . PONV (postoperative nausea and vomiting)     Past Surgical History:  Procedure Laterality Date  . ABDOMINAL HYSTERECTOMY    . CESAREAN SECTION     X 2  . COLONOSCOPY N/A 02/17/2015   Procedure: COLONOSCOPY;  Surgeon: Rogene Houston, MD;  [facility];  Service: Endoscopy;  Laterality: N/A;  930  . KNEE ARTHROSCOPY WITH MEDIAL MENISECTOMY Right 04/24/2017   Procedure: KNEE ARTHROSCOPY WITH PARTIAL MEDIAL MENISECTOMY; ACL DEBRIEDMENT;  Surgeon: Carole Civil, MD;  Location: [facility];  Service: Orthopedics;  Laterality: Right;    There were no vitals filed for this visit.  Subjective Assessment - 06/03/17 1352    Subjective  Pt presents to therapy after working today. No knee pani currently but does state it feels a little swollen.    How long can you sit comfortably?  no issues    How long can you stand comfortably?  Able to stand comfortably for 3 hours (was an hour)    How long can you walk comfortably?  Able to walk 3 hours prior fatigue (3 hours)    Patient Stated Goals  not have anymore surgery    Currently in Pain?  No/denies           [facility] Adult PT  Treatment/Exercise - 06/03/17 0001      Knee/Hip Exercises: Stretches   Passive Hamstring Stretch  Right;3 reps;30 seconds;Limitations    Passive Hamstring Stretch Limitations  standing, 12" step    Gastroc Stretch  Both;3 reps;30 seconds;Limitations    Gastroc Stretch Limitations  slant board      Knee/Hip Exercises: Standing   Heel Raises  Both;20 reps    Heel Raises Limitations  heel and toe on foam    Forward Lunges  Both;15 reps;Limitations    Forward Lunges Limitations  on BOSU (dome up)    Terminal Knee Extension  Right;Limitations    Theraband Level (Terminal Knee Extension)  Level 3 (Green)    Lateral Step Up  Right;2 sets;10 reps;Step Height: 8"    Forward Step Up  Right;2 sets;10 reps;Step Height: 8"    Step Down  Right;15 reps;Step Height: 8"    SLS  on BOSU (dome up) 5x10" each, intermittent UE support    Rebounder  fwd/retro monster walks 18f x2RT with RTB    Other Standing Knee Exercises  R heel taps on 4" step x20 reps    Other Standing Knee Exercises  sidestepping with RTB [id number]      Knee/Hip Exercises:  Supine   Knee Extension Limitations  3 hyperextension (LLE 6deg hyperextension)    Knee Flexion Limitations  128      Manual Therapy   Manual Therapy  Soft tissue mobilization    Manual therapy comments  Manual complete separate than rest of tx    Soft tissue mobilization  cross friction massage to R medial patellar tendon            PT Short Term Goals - 05/29/17 1609      PT SHORT TERM GOAL #1   Title  Pt will be independent with HEP and perform consistently in order to maximize ROM and return to PLOF.    Baseline  05/29/17:  Reports complaince with HEP multiple times a day    Status  Achieved      PT SHORT TERM GOAL #2   Title  Pt will have improved R knee flexion AROM to 120 deg or > to maximize gait, stair ambulation, and return to PLOF.    Baseline  05/29/17:  AROM 0-125 degrees    Status  Achieved      PT SHORT TERM GOAL #3   Title  Pt  will be able to perform bil SLS for 30 sec or > with min to no sway to demo improved balance and functional strength in order to maximize gait.     Baseline  03/14: Able to SLS 60" on firm and dynamic surfaced with minimal sway    Status  Achieved      PT SHORT TERM GOAL #4   Title  Pt will have decreased joint line edema of RLE by 1" to be symmetrical with LLE in order to maximize ROM.    Baseline  03/14: Presents with increased edema with re-assessment on 2nd day of RTW.  Pt educated on benefits of compression hose    Status  On-going        PT Long Term Goals - 05/29/17 1614      PT LONG TERM GOAL #1   Title  Pt will have at least 4+/5 MMT throughout all muscle groups tested in order to maximize gait, balance, and promote return to PLOF.    Baseline  03/14: see MMT    Status  Achieved      PT LONG TERM GOAL #2   Title  Pt will be able to ambulate at least 726f in 3MWT with min to no gait deviations in order to maximize community ambulation, demonstrate improved functional strength, and promote return to PLOF.    Baseline  03/14:  789 feet in  3MWT    Status  Achieved      PT LONG TERM GOAL #3   Title  Pt will be able to perform 5xSTS in 10 sec or < with proper mechanics to demonstrate improved functional BLE strength.     Baseline  05/29/17:  5 STS in 8.7" no HHA and equal weight distrubtion    Status  Achieved      PT LONG TERM GOAL #4   Title  If cleared by MD, pt will report being able to work for 4 hours or > with min to no knee pain to demonstrate return to PLOF.     Baseline  05/29/2017:  Reports RTW full time per MD, increased edema and pain    Status  On-going      PT LONG TERM GOAL #5   Title  If cleared by MD, pt will be able to return  to her regular exercise routine at least 3x/week to demonstrate return to PLOF and promote self-maintence at home.    Baseline  Not cleared by MD yet    Status  Not Met            Plan - 06/03/17 1438    Clinical Impression  Statement  Pt 6 weeks post-op this week so began standing therex out of brace this date. Pt tolerated all exercises well, no reports of pain, just some complaints of weakness and fatigue. Pt challenged by SLS on BOSU and lunging on BOSU. She continues to c/o anterior knee pain with full extension; PT palpated and noted a taut band along medial patellar tendon and pt reported it as recreating her pain. Performed cross friction and STM to this area and pt reported slight improvement in pain with full knee extension. Recommend continuing manual to this area to decrease restrictions and pain with full knee extension. AROM 3deg hyperextension (LLE has 6 deg hyperextension) to 128deg flexion. Continue as planned, progressing as able.     Rehab Potential  Good    PT Frequency  2x / week    PT Duration  6 weeks    PT Treatment/Interventions  ADLs/Self Care Home Management;Cryotherapy;Electrical Stimulation;Moist Heat;Ultrasound;DME Instruction;Gait training;Stair training;Functional mobility training;Therapeutic activities;Therapeutic exercise;Balance training;Neuromuscular re-education;Patient/family education;Manual techniques;Passive range of motion;Dry needling;Energy conservation;Splinting;Taping    PT Next Visit Plan  f/u with compression hose for edema control. Progress functional strengthening with increased focus on hip extension. Continue BLE, functional, and hip/knee flexion, dynamic stability work; STM for medial patellar tendon restrictions    PT Home Exercise Plan  2/20: quad sets, heel slides, standing calf stretcha at wall; 05/20/17: heel/toe raises and STS; 3/12: TKE; 05/29/17: squats and stairs    Consulted and Agree with Plan of Care  Patient       Patient will benefit from skilled therapeutic intervention in order to improve the following deficits and impairments:  Abnormal gait, Decreased activity tolerance, Decreased balance, Decreased endurance, Decreased range of motion, Decreased strength,  Difficulty walking, Hypomobility, Increased edema, Increased fascial restricitons, Increased muscle spasms, Impaired flexibility, Improper body mechanics, Pain  Visit Diagnosis: Stiffness of right knee, not elsewhere classified  Localized edema  Muscle weakness (generalized)     Problem List Patient Active Problem List   Diagnosis Date Noted  . S/P right knee arthroscopy 04/25/17 05/01/2017  . Vasomotor flushing 08/30/2014  . Pain in joint, shoulder region 12/14/2013  . Muscle weakness (generalized) 12/14/2013  . Decreased range of motion of right shoulder 12/14/2013       Natalie Hess PT, DPT  [place] [address] [place], [place], [address] Phone: [phone]   Fax:  [phone]  Name: DAVID TOWSON MRN: [id number] Date of Birth: May 20, 1960

## 2017-06-05 ENCOUNTER — Encounter (HOSPITAL_COMMUNITY): Payer: Self-pay

## 2017-06-05 ENCOUNTER — Ambulatory Visit (HOSPITAL_COMMUNITY): Payer: 59

## 2017-06-05 DIAGNOSIS — R600 Localized edema: Secondary | ICD-10-CM | POA: Diagnosis not present

## 2017-06-05 DIAGNOSIS — M25661 Stiffness of right knee, not elsewhere classified: Secondary | ICD-10-CM | POA: Diagnosis not present

## 2017-06-05 DIAGNOSIS — M6281 Muscle weakness (generalized): Secondary | ICD-10-CM

## 2017-06-05 NOTE — Therapy (Signed)
[place], [place], [address] Phone: [phone]   Fax:  [phone]  Physical Therapy Treatment  Patient Details  Name: Natalie Hess MRN: [id number] Date of Birth: Jul 17, 1960 Referring Provider: Arther Abbott, MD   Encounter Date: 06/05/2017  PT End of Session - 06/05/17 1436    Visit Number  9    Number of Visits  13    Date for PT Re-Evaluation  06/18/17    Authorization Type  [facility]    Authorization Time Period  05/07/17 to 06/18/17    PT Start Time  1434    PT Stop Time  1522    PT Time Calculation (min)  48 min    Activity Tolerance  Patient tolerated treatment well    Behavior During Therapy  [facility] for tasks assessed/performed       Past Medical History:  Diagnosis Date  . Medical history non-contributory   . PONV (postoperative nausea and vomiting)     Past Surgical History:  Procedure Laterality Date  . ABDOMINAL HYSTERECTOMY    . CESAREAN SECTION     X 2  . COLONOSCOPY N/A 02/17/2015   Procedure: COLONOSCOPY;  Surgeon: Rogene [place], MD;  [facility];  Service: Endoscopy;  Laterality: N/A;  930  . KNEE ARTHROSCOPY WITH MEDIAL MENISECTOMY Right 04/24/2017   Procedure: KNEE ARTHROSCOPY WITH PARTIAL MEDIAL MENISECTOMY; ACL DEBRIEDMENT;  Surgeon: Carole Civil, MD;  Location: [facility];  Service: Orthopedics;  Laterality: Right;    There were no vitals filed for this visit.  Subjective Assessment - 06/05/17 1433    Subjective  Pt arrived following work, stated main difficutly with standing for so long, makes her feet and hip hurt.  No reports of pain today.  Reports she turned in closet this morning and got a strong "jolt" of pain anterior knee.    Patient Stated Goals  not have anymore surgery    Currently in Pain?  No/denies                      [facility] Adult PT Treatment/Exercise - 06/05/17 0001      Knee/Hip Exercises: Stretches   Passive Hamstring  Stretch  Right;3 reps;30 seconds;Limitations    Passive Hamstring Stretch Limitations  standing, 12" step    Gastroc Stretch  Both;3 reps;30 seconds;Limitations    Gastroc Stretch Limitations  slant board      Knee/Hip Exercises: Standing   Heel Raises  Both;20 reps    Heel Raises Limitations  squat with yellow ball then heel raise wiht UE flexion    Forward Lunges  Both;20 reps    Forward Lunges Limitations  on BOSU (dome up)    Terminal Knee Extension  Right;Limitations    Theraband Level (Terminal Knee Extension)  Level 3 (Green)    Lateral Step Up  Right;2 sets;10 reps;Step Height: 8"    Forward Step Up  Right;2 sets;10 reps;Step Height: 8" with knee drive 5" holds    Step Down  Right;15 reps;Step Height: 8"    Functional Squat  2 sets;20 reps    SLS  on BOSU (dome up) 5x10" each, intermittent UE support    Rebounder  fwd/retro monster walks 49f x2RT with RTB    Other Standing Knee Exercises  sidestepping with RTB [id number]      Knee/Hip Exercises: Prone   Other Prone Exercises  quadruped hip extensin Rt only (no weight bearing)  with [id number]      Manual Therapy   Manual Therapy  Soft tissue mobilization    Manual therapy comments  Manual complete separate than rest of tx    Soft tissue mobilization  cross friction massage to R medial patellar tendon                PT Short Term Goals - 05/29/17 1609      PT SHORT TERM GOAL #1   Title  Pt will be independent with HEP and perform consistently in order to maximize ROM and return to PLOF.    Baseline  05/29/17:  Reports complaince with HEP multiple times a day    Status  Achieved      PT SHORT TERM GOAL #2   Title  Pt will have improved R knee flexion AROM to 120 deg or > to maximize gait, stair ambulation, and return to PLOF.    Baseline  05/29/17:  AROM 0-125 degrees    Status  Achieved      PT SHORT TERM GOAL #3   Title  Pt will be able to perform bil SLS for 30 sec or > with min to no sway to demo improved  balance and functional strength in order to maximize gait.     Baseline  03/14: Able to SLS 60" on firm and dynamic surfaced with minimal sway    Status  Achieved      PT SHORT TERM GOAL #4   Title  Pt will have decreased joint line edema of RLE by 1" to be symmetrical with LLE in order to maximize ROM.    Baseline  03/14: Presents with increased edema with re-assessment on 2nd day of RTW.  Pt educated on benefits of compression hose    Status  On-going        PT Long Term Goals - 05/29/17 1614      PT LONG TERM GOAL #1   Title  Pt will have at least 4+/5 MMT throughout all muscle groups tested in order to maximize gait, balance, and promote return to PLOF.    Baseline  03/14: see MMT    Status  Achieved      PT LONG TERM GOAL #2   Title  Pt will be able to ambulate at least 711f in 3MWT with min to no gait deviations in order to maximize community ambulation, demonstrate improved functional strength, and promote return to PLOF.    Baseline  03/14:  789 feet in  3MWT    Status  Achieved      PT LONG TERM GOAL #3   Title  Pt will be able to perform 5xSTS in 10 sec or < with proper mechanics to demonstrate improved functional BLE strength.     Baseline  05/29/17:  5 STS in 8.7" no HHA and equal weight distrubtion    Status  Achieved      PT LONG TERM GOAL #4   Title  If cleared by MD, pt will report being able to work for 4 hours or > with min to no knee pain to demonstrate return to PLOF.     Baseline  05/29/2017:  Reports RTW full time per MD, increased edema and pain    Status  On-going      PT LONG TERM GOAL #5   Title  If cleared by MD, pt will be able to return to her regular exercise routine at least 3x/week to demonstrate return to PLOF and  promote self-maintence at home.    Baseline  Not cleared by MD yet    Status  Not Met            Plan - 06/05/17 1527    Clinical Impression Statement  Removed brace this session per 6 week post-op.  Session focus with  functional strengthening.  Increased step down height for eccentric quad control and added quadruped hip extension with resistance (no Rt knee weight bearing wiht new activity).  Pt required cueing to improve knee extension with step ups for strengthening, continues to be fearful with knee extension.  Pt able to complete all exercises with no reports of pain.  Did state her knee has had 3-4 episodes with knee "jolt" and intermittent pain at home.  She continues to wear brace per MD outside of therapy and household activities.  EOS wiht manual cross friction and STM to patella tendon for pain control to improve full knee extension.  Minimal edema present, discussion held with purchase of compression hose, reports she has paperwork but has not purchased yet.      Rehab Potential  Good    PT Frequency  2x / week    PT Duration  6 weeks    PT Treatment/Interventions  ADLs/Self Care Home Management;Cryotherapy;Electrical Stimulation;Moist Heat;Ultrasound;DME Instruction;Gait training;Stair training;Functional mobility training;Therapeutic activities;Therapeutic exercise;Balance training;Neuromuscular re-education;Patient/family education;Manual techniques;Passive range of motion;Dry needling;Energy conservation;Splinting;Taping    PT Next Visit Plan  f/u with compression hose for edema control. Progress functional strengthening with increased focus on hip extension. Continue BLE, functional, and hip/knee flexion, dynamic stability work; STM for medial patellar tendon restrictions    PT Home Exercise Plan  2/20: quad sets, heel slides, standing calf stretcha at wall; 05/20/17: heel/toe raises and STS; 3/12: TKE; 05/29/17: squats and stairs       Patient will benefit from skilled therapeutic intervention in order to improve the following deficits and impairments:  Abnormal gait, Decreased activity tolerance, Decreased balance, Decreased endurance, Decreased range of motion, Decreased strength, Difficulty walking,  Hypomobility, Increased edema, Increased fascial restricitons, Increased muscle spasms, Impaired flexibility, Improper body mechanics, Pain  Visit Diagnosis: Stiffness of right knee, not elsewhere classified  Localized edema  Muscle weakness (generalized)     Problem List Patient Active Problem List   Diagnosis Date Noted  . S/P right knee arthroscopy 04/25/17 05/01/2017  . Vasomotor flushing 08/30/2014  . Pain in joint, shoulder region 12/14/2013  . Muscle weakness (generalized) 12/14/2013  . Decreased range of motion of right shoulder 12/14/2013   Ihor Austin, LPTA; [place]  Aldona Lento 06/05/2017, 3:38 PM  [place], [place], [address] Phone: [phone]   Fax:  [phone]  Name: Natalie Hess MRN: [id number] Date of Birth: 08/13/1960

## 2017-06-10 ENCOUNTER — Ambulatory Visit (HOSPITAL_COMMUNITY): Payer: 59

## 2017-06-10 ENCOUNTER — Encounter (HOSPITAL_COMMUNITY): Payer: Self-pay

## 2017-06-10 DIAGNOSIS — M25661 Stiffness of right knee, not elsewhere classified: Secondary | ICD-10-CM | POA: Diagnosis not present

## 2017-06-10 DIAGNOSIS — R600 Localized edema: Secondary | ICD-10-CM

## 2017-06-10 DIAGNOSIS — M6281 Muscle weakness (generalized): Secondary | ICD-10-CM

## 2017-06-10 NOTE — Therapy (Signed)
[place], [place], [address] Phone: [phone]   Fax:  [phone]  Physical Therapy Treatment  Patient Details  Name: Natalie Hess MRN: [id number] Date of Birth: July 14, 1960 Referring Provider: Arther Abbott, MD   Encounter Date: 06/10/2017  PT End of Session - 06/10/17 1513    Visit Number  10    Number of Visits  13    Date for PT Re-Evaluation  06/18/17    Authorization Type  [facility]    Authorization Time Period  05/07/17 to 06/18/17    PT Start Time  1510    PT Stop Time  1604    PT Time Calculation (min)  54 min    Activity Tolerance  Patient tolerated treatment well    Behavior During Therapy  [facility] for tasks assessed/performed       Past Medical History:  Diagnosis Date  . Medical history non-contributory   . PONV (postoperative nausea and vomiting)     Past Surgical History:  Procedure Laterality Date  . ABDOMINAL HYSTERECTOMY    . CESAREAN SECTION     X 2  . COLONOSCOPY N/A 02/17/2015   Procedure: COLONOSCOPY;  Surgeon: Rogene [place], MD;  Location: [facility];  Service: Endoscopy;  Laterality: N/A;  930  . KNEE ARTHROSCOPY WITH MEDIAL MENISECTOMY Right 04/24/2017   Procedure: KNEE ARTHROSCOPY WITH PARTIAL MEDIAL MENISECTOMY; ACL DEBRIEDMENT;  Surgeon: Carole Civil, MD;  Location: [facility];  Service: Orthopedics;  Laterality: Right;    There were no vitals filed for this visit.  Subjective Assessment - 06/10/17 1508    Subjective  Pt stated she has been on feet a lot the last couple days, knees sore no real pain.  Feels she may pivoted wrong with a "jolt" on knee.      Patient Stated Goals  not have anymore surgery    Currently in Pain?  No/denies         [facility] PT Assessment - 06/10/17 0001      Assessment   Medical Diagnosis  S/P right knee arthroscopy    Referring Provider  Arther Abbott, MD    Onset Date/Surgical Date  04/24/17    Next MD Visit  4/10    Prior  Therapy  some on shoulder but none for present issue      Precautions   Precaution Comments  hinged knee brace when OOB (out of brace at 6 weeks)      Circumferential Edema   Circumferential - Right  joint line 13.5"; suprapatellar: 16" was joint line 13.3"; suprapatellar: 16"    Circumferential - Left   joint line 13.3"; suprapatellar 15.25" joint line 13.3"; suprapatellar 16.25"      AROM   Right/Left Knee  Right    Right Knee Extension  0    Right Knee Flexion  136 was 125      Strength   Strength Assessment Site  Hip;Knee    Right Hip Flexion  5/5    Right Hip Extension  4+/5 was 4/5    Right Hip ABduction  5/5 was 4+/5    Left Hip Flexion  5/5    Left Hip Extension  5/5 was 4/5    Left Hip ABduction  4+/5 was 4+/5    Right Knee Flexion  4+/5 was 4+/5    Right Knee Extension  4+/5 was 4+/5    Left Knee Flexion  5/5    Left Knee  Extension  5/5    Right Ankle Dorsiflexion  -- was 4+/5    Left Ankle Dorsiflexion  -- 5            No data recorded       [facility] Adult PT Treatment/Exercise - 06/10/17 0001      Knee/Hip Exercises: Standing   Heel Raises  Both;20 reps    Heel Raises Limitations  squat with yellow ball then heel raise wiht UE flexion    Forward Lunges  Both;20 reps    Forward Lunges Limitations  on BOSU (dome up)    Terminal Knee Extension  Right;Limitations;20 reps    Theraband Level (Terminal Knee Extension)  Level 3 (Green)    Lateral Step Up  Right;2 sets;10 reps;Step Height: 8"    Forward Step Up  Right;20 reps;Hand Hold: 0 onto BOSU    Step Down  Right;20 reps;Hand Hold: 1;Limitations    Step Down Limitations  BOSU    Functional Squat  2 sets;20 reps    SLS  on BOSU (dome up) 5x10" each, intermittent UE support      Manual Therapy   Manual Therapy  Edema management;Soft tissue mobilization    Manual therapy comments  Manual complete separate than rest of tx    Edema Management  Retro massage with LE elevated    Soft tissue mobilization   cross friction massage to R medial patellar tendon                PT Short Term Goals - 05/29/17 1609      PT SHORT TERM GOAL #1   Title  Pt will be independent with HEP and perform consistently in order to maximize ROM and return to PLOF.    Baseline  05/29/17:  Reports complaince with HEP multiple times a day    Status  Achieved      PT SHORT TERM GOAL #2   Title  Pt will have improved R knee flexion AROM to 120 deg or > to maximize gait, stair ambulation, and return to PLOF.    Baseline  05/29/17:  AROM 0-125 degrees    Status  Achieved      PT SHORT TERM GOAL #3   Title  Pt will be able to perform bil SLS for 30 sec or > with min to no sway to demo improved balance and functional strength in order to maximize gait.     Baseline  03/14: Able to SLS 60" on firm and dynamic surfaced with minimal sway    Status  Achieved      PT SHORT TERM GOAL #4   Title  Pt will have decreased joint line edema of RLE by 1" to be symmetrical with LLE in order to maximize ROM.    Baseline  03/14: Presents with increased edema with re-assessment on 2nd day of RTW.  Pt educated on benefits of compression hose    Status  On-going        PT Long Term Goals - 05/29/17 1614      PT LONG TERM GOAL #1   Title  Pt will have at least 4+/5 MMT throughout all muscle groups tested in order to maximize gait, balance, and promote return to PLOF.    Baseline  03/14: see MMT    Status  Achieved      PT LONG TERM GOAL #2   Title  Pt will be able to ambulate at least 733f in 3MWT with min to no  gait deviations in order to maximize community ambulation, demonstrate improved functional strength, and promote return to PLOF.    Baseline  03/14:  789 feet in  3MWT    Status  Achieved      PT LONG TERM GOAL #3   Title  Pt will be able to perform 5xSTS in 10 sec or < with proper mechanics to demonstrate improved functional BLE strength.     Baseline  05/29/17:  5 STS in 8.7" no HHA and equal weight  distrubtion    Status  Achieved      PT LONG TERM GOAL #4   Title  If cleared by MD, pt will report being able to work for 4 hours or > with min to no knee pain to demonstrate return to PLOF.     Baseline  05/29/2017:  Reports RTW full time per MD, increased edema and pain    Status  On-going      PT LONG TERM GOAL #5   Title  If cleared by MD, pt will be able to return to her regular exercise routine at least 3x/week to demonstrate return to PLOF and promote self-maintence at home.    Baseline  Not cleared by MD yet    Status  Not Met            Plan - 06/10/17 1632    Clinical Impression Statement  Pt stated she feels ready for discharge.  Reviewed goals and discussion held with goals that have not been met yet (increased swelling due to RTW, hip weakness, continues to c/o catching/pain with extension and return to gym).  Pt continues to have edema present proximal knee with RTW for 8 hour session.  Pt educated on benefits of compression hose to assist with, pt has already been given paperwork and measurements taken to assist with hose.  Therex focus on functional strengthening on dynamic surfaces to assist with stability with functional tasks.  No reports of pain through session.  EOS iwth manual technqieus to assist with edema control and cross friction/soft tissue mobilization technuiques to patella tendon to assist with pain and knee extension.  Following explaination of current findings with reassessment pt wishes to continue therapy.      Rehab Potential  Good    PT Frequency  2x / week    PT Duration  6 weeks    PT Treatment/Interventions  ADLs/Self Care Home Management;Cryotherapy;Electrical Stimulation;Moist Heat;Ultrasound;DME Instruction;Gait training;Stair training;Functional mobility training;Therapeutic activities;Therapeutic exercise;Balance training;Neuromuscular re-education;Patient/family education;Manual techniques;Passive range of motion;Dry needling;Energy  conservation;Splinting;Taping    PT Next Visit Plan  f/u with compression hose for edema control. Progress functional strengthening with increased focus on hip extension. Continue BLE, functional, and hip/knee flexion, dynamic stability work; STM for medial patellar tendon restrictions    PT Home Exercise Plan  2/20: quad sets, heel slides, standing calf stretcha at wall; 05/20/17: heel/toe raises and STS; 3/12: TKE; 05/29/17: squats and stairs       Patient will benefit from skilled therapeutic intervention in order to improve the following deficits and impairments:  Abnormal gait, Decreased activity tolerance, Decreased balance, Decreased endurance, Decreased range of motion, Decreased strength, Difficulty walking, Hypomobility, Increased edema, Increased fascial restricitons, Increased muscle spasms, Impaired flexibility, Improper body mechanics, Pain  Visit Diagnosis: Stiffness of right knee, not elsewhere classified  Localized edema  Muscle weakness (generalized)     Problem List Patient Active Problem List   Diagnosis Date Noted  . S/P right knee arthroscopy 04/25/17 05/01/2017  .  Vasomotor flushing 08/30/2014  . Pain in joint, shoulder region 12/14/2013  . Muscle weakness (generalized) 12/14/2013  . Decreased range of motion of right shoulder 12/14/2013   Ihor Austin, LPTA; [facility] [phone]  Aldona Lento 06/10/2017, 4:46 PM  [place] [address] [place], [place], [address] Phone: [phone]   Fax:  [phone]  Name: NATHALYA WOLANSKI MRN: [id number] Date of Birth: 03/14/1961

## 2017-06-12 ENCOUNTER — Ambulatory Visit (HOSPITAL_COMMUNITY): Payer: 59

## 2017-06-12 DIAGNOSIS — M25661 Stiffness of right knee, not elsewhere classified: Secondary | ICD-10-CM | POA: Diagnosis not present

## 2017-06-12 DIAGNOSIS — R600 Localized edema: Secondary | ICD-10-CM | POA: Diagnosis not present

## 2017-06-12 DIAGNOSIS — M6281 Muscle weakness (generalized): Secondary | ICD-10-CM | POA: Diagnosis not present

## 2017-06-12 NOTE — Therapy (Signed)
PHYSICAL THERAPY DISCHARGE SUMMARY  Visits from Start of Care: 11  Current functional level related to goals / functional outcomes: *see below: all goals met except one, still limited by postoperative restrictions.    Remaining deficits: Gross weakness in BLE, mild joint edema, mild medial proximal tibial pain.    Education / Equipment: Encouraged to update HEP appropriately  Plan: Patient agrees to discharge.  Patient goals were met. Patient is being discharged due to being pleased with the current functional level.  ?????         4:31 PM, 06/12/17 Etta Grandchild, PT, DPT Physical Therapist at [facility] [phone] (office)      ----------------------------------------------------------------------------------------------------------------------------------    [facility] [address] [place], [place], [address] Phone: [phone]   Fax:  [phone]  Physical Therapy Reassessment/Treatment  Patient Details  Name: Natalie Hess MRN: [id number] Date of Birth: [birth year] Referring Provider: Arther Abbott, MD   Encounter Date: 06/12/2017  PT End of Session - 06/12/17 1617    Visit Number  11    Number of Visits  13    Authorization Type  [employer]    Authorization Time Period  05/07/17 to 06/18/17    PT Start Time  1520    PT Stop Time  1601    PT Time Calculation (min)  41 min    Activity Tolerance  Patient tolerated treatment well    Behavior During Therapy  [facility] for tasks assessed/performed       Past Medical History:  Diagnosis Date  . Medical history non-contributory   . PONV (postoperative nausea and vomiting)     Past Surgical History:  Procedure Laterality Date  . ABDOMINAL HYSTERECTOMY    . CESAREAN SECTION     X 2  . COLONOSCOPY N/A 02/17/2015   Procedure: COLONOSCOPY;  Surgeon: Rogene Houston, MD;  Location: [facility];  Service: Endoscopy;  Laterality: N/A;   930  . KNEE ARTHROSCOPY WITH MEDIAL MENISECTOMY Right 04/24/2017   Procedure: KNEE ARTHROSCOPY WITH PARTIAL MEDIAL MENISECTOMY; ACL DEBRIEDMENT;  Surgeon: Carole Civil, MD;  Location: [facility] ORS;  Service: Orthopedics;  Laterality: Right;    There were no vitals filed for this visit.  Subjective Assessment - 06/12/17 1523    Subjective  Pt reports doing well. No new updates. HEP is going well. No pain today.     Currently in Pain?  No/denies       FOTO: 72 (28% limited)   [facility] PT Assessment - 06/12/17 0001      Assessment   Medical Diagnosis  S/P right knee arthroscopy    Onset Date/Surgical Date  04/24/17    Next MD Visit  4/10    Prior Therapy  some on shoulder but none for present issue      Precautions   Precaution Comments  hinged knee brace when OOB (out of brace at 6 weeks)      AROM   Overall AROM   -- taken 06/12/17    Right/Left Knee  Right    Right Knee Extension  0    Right Knee Flexion  136 was 125      Strength   Strength Assessment Site  -- taken 06/10/17    Right Hip Flexion  5/5    Right Hip Extension  4+/5 was 4/5    Right Hip ABduction  5/5 was 4+/5    Left Hip Flexion  5/5  Left Hip Extension  5/5 was 4/5    Left Hip ABduction  4+/5 was 4+/5    Right Knee Flexion  4+/5 was 4+/5    Right Knee Extension  4+/5 was 4+/5    Left Knee Flexion  5/5    Left Knee Extension  5/5    Right Ankle Dorsiflexion  -- was 4+/5    Left Ankle Dorsiflexion  -- 5          [facility] Adult PT Treatment/Exercise - 06/12/17 0001      Ambulation/Gait   Ambulation Distance (Feet)  900 Feet    Gait velocity  1.68ms      Knee/Hip Exercises: Stretches   Gastroc Stretch  Both;3 reps;30 seconds;Limitations    Gastroc Stretch Limitations  slant board      Knee/Hip Exercises: Standing   Heel Raises  Both;1 set;15 reps single leg      Knee/Hip Exercises: Prone   Other Prone Exercises  Prone SLR: 1x10, pillow under pelvis          PT Short Term Goals - 06/12/17  1538      PT SHORT TERM GOAL #1   Title  Pt will be independent with HEP and perform consistently in order to maximize ROM and return to PLOF.    Baseline  05/29/17:  Reports complaince with HEP multiple times a day    Time  3    Period  Weeks    Status  Achieved      PT SHORT TERM GOAL #2   Title  Pt will have improved R knee flexion AROM to 120 deg or > to maximize gait, stair ambulation, and return to PLOF.    Baseline  05/29/17:  AROM 0-125 degrees    Time  3    Period  Weeks    Status  Achieved      PT SHORT TERM GOAL #3   Title  Pt will be able to perform bil SLS for 30 sec or > with min to no sway to demo improved balance and functional strength in order to maximize gait.     Baseline  03/14: Able to SLS 60" on firm and dynamic surfaced with minimal sway    Time  3    Period  Weeks    Status  Achieved      PT SHORT TERM GOAL #4   Title  Pt will have decreased joint line edema of RLE by 1" to be symmetrical with LLE in order to maximize ROM.    Baseline  03/14: Presents with increased edema with re-assessment on 2nd day of RTW.  Pt educated on benefits of compression hose    Time  3    Period  Weeks    Status  Achieved        PT Long Term Goals - 06/12/17 1539      PT LONG TERM GOAL #1   Title  Pt will have at least 4+/5 MMT throughout all muscle groups tested in order to maximize gait, balance, and promote return to PLOF.    Baseline  03/14: see MMT    Time  6    Period  Weeks    Status  Achieved    Target Date  06/18/17      PT LONG TERM GOAL #2   Title  Pt will be able to ambulate at least 7554fin 3MWT with min to no gait deviations in order to maximize community ambulation, demonstrate improved  functional strength, and promote return to PLOF.    Baseline  03/14:  789 feet in  3MWT    Time  6    Period  Weeks    Status  Achieved      PT LONG TERM GOAL #3   Title  Pt will be able to perform 5xSTS in 10 sec or < with proper mechanics to demonstrate improved  functional BLE strength.     Baseline  05/29/17:  5 STS in 8.7" no HHA and equal weight distrubtion    Time  6    Period  Weeks    Status  Achieved      PT LONG TERM GOAL #4   Title  If cleared by MD, pt will report being able to work for 4 hours or > with min to no knee pain to demonstrate return to PLOF.     Baseline  05/29/2017:  Reports RTW full time per MD, increased edema and pain    Time  6    Period  Weeks    Status  Achieved      PT LONG TERM GOAL #5   Title  If cleared by MD, pt will be able to return to her regular exercise routine at least 3x/week to demonstrate return to PLOF and promote self-maintence at home. still limited aby MD at this time. Physician has asked patient to refrain from weights, trail walking, and other exercises.     Baseline  Not cleared by MD yet    Time  6    Period  Weeks    Status  Not Met            Plan - 06/12/17 1618    Clinical Impression Statement  Pt reports she is ready for DC. Reassessment data collected from last session as well as today. Pt demonstrating 4+/5 or greater stregth overall throughotu BLE, SLS baalnce >30sec bilat, AMB at > 1.22ms without AD, ahas returned to work full  time with swelling and pain well managed. All goals have been met. HEP has been updated within the restrictions of the surgeon.     Rehab Potential  Good    PT Frequency  2x / week    PT Duration  6 weeks    PT Treatment/Interventions  ADLs/Self Care Home Management;Cryotherapy;Electrical Stimulation;Moist Heat;Ultrasound;DME Instruction;Gait training;Stair training;Functional mobility training;Therapeutic activities;Therapeutic exercise;Balance training;Neuromuscular re-education;Patient/family education;Manual techniques;Passive range of motion;Dry needling;Energy conservation;Splinting;Taping    PT Next Visit Plan  Pt now DC from services    Consulted and Agree with Plan of Care  Patient       Patient will benefit from skilled therapeutic  intervention in order to improve the following deficits and impairments:  Abnormal gait, Decreased activity tolerance, Decreased balance, Decreased endurance, Decreased range of motion, Decreased strength, Difficulty walking, Hypomobility, Increased edema, Increased fascial restricitons, Increased muscle spasms, Impaired flexibility, Improper body mechanics, Pain  Visit Diagnosis: Stiffness of right knee, not elsewhere classified  Localized edema  Muscle weakness (generalized)     Problem List Patient Active Problem List   Diagnosis Date Noted  . S/P right knee arthroscopy 04/25/17 05/01/2017  . Vasomotor flushing 08/30/2014  . Pain in joint, shoulder region 12/14/2013  . Muscle weakness (generalized) 12/14/2013  . Decreased range of motion of right shoulder 12/14/2013    4:29 PM, 06/12/17 AEtta Grandchild PT, DPT Physical Therapist at [phone](office)      Welby Montminy C 06/12/2017, 4:28 PM  [place], [place], [address] Phone: [phone]   Fax:  [phone]  Name: Natalie Hess MRN: [id number] Date of Birth: 12-17-1960

## 2017-06-17 ENCOUNTER — Encounter (HOSPITAL_COMMUNITY): Payer: Self-pay | Admitting: Physical Therapy

## 2017-06-19 ENCOUNTER — Encounter (HOSPITAL_COMMUNITY): Payer: Self-pay

## 2017-06-25 ENCOUNTER — Encounter: Payer: Self-pay | Admitting: Orthopedic Surgery

## 2017-06-25 ENCOUNTER — Ambulatory Visit (INDEPENDENT_AMBULATORY_CARE_PROVIDER_SITE_OTHER): Payer: Self-pay | Admitting: Orthopedic Surgery

## 2017-06-25 VITALS — BP 137/85 | HR 83 | Ht 67.0 in | Wt 134.0 lb

## 2017-06-25 DIAGNOSIS — Z9889 Other specified postprocedural states: Secondary | ICD-10-CM

## 2017-06-25 NOTE — Progress Notes (Signed)
Chief Complaint  Patient presents with  . Routine Post Op    SARK DOS 04/24/17    04/24/2017  1:43 PM  PATIENT:  Natalie Hess  57 y.o. female  PRE-OPERATIVE DIAGNOSIS:  ACL and medial meniscal tear right knee  POST-OPERATIVE DIAGNOSIS:  ACL and medial meniscal tear right knee  PROCEDURE:  Procedure(s): KNEE ARTHROSCOPY WITH PARTIAL MEDIAL MENISECTOMY; ACL DEBRIEDMENT (Right)   Exam under anesthesia: 2+ Lockman grade 1 pivot full range of motion  Intra-Op torn ACL mid substance,  posterior horn medial meniscus tear,  chondral lesion medial femoral condyle,  chondromalacia lateral tibial plateau and patella   SURGEON:  Surgeon(s) and Role:    Carole Civil, MD - Primary    Aleta is doing well she has a little pain in the front of the knee she has a little lack of extension of about 2 inches on the prone position with prone leg hanging  She has a firm anterior drawer firm and Lockman grade 1 flexion has returned to normal  Recommend bracing for work and outdoor activity  Return in 1 month she will be working on prone hangs daily 30 minutes

## 2017-07-23 ENCOUNTER — Encounter: Payer: Self-pay | Admitting: Orthopedic Surgery

## 2017-07-23 ENCOUNTER — Ambulatory Visit (INDEPENDENT_AMBULATORY_CARE_PROVIDER_SITE_OTHER): Payer: 59 | Admitting: Orthopedic Surgery

## 2017-07-23 VITALS — BP 136/87 | HR 71 | Ht 67.0 in | Wt 134.0 lb

## 2017-07-23 DIAGNOSIS — S83511D Sprain of anterior cruciate ligament of right knee, subsequent encounter: Secondary | ICD-10-CM

## 2017-07-23 DIAGNOSIS — M23321 Other meniscus derangements, posterior horn of medial meniscus, right knee: Secondary | ICD-10-CM

## 2017-07-23 DIAGNOSIS — Z9889 Other specified postprocedural states: Secondary | ICD-10-CM

## 2017-07-23 NOTE — Progress Notes (Signed)
Chief Complaint  Patient presents with  . Post-op Follow-up    right knee arthroscopy 04/25/17    Status post arthroscopy of the knee partial meniscectomy exam under anesthesia partial ACL tear status post rehab  The patient has been working on knee extension and has reached her goals with full extension if not even hyperextension both knees.  Mild trace anterior drawer  No effusion flexion back to normal  She says she does have some intermittent episodes where the knee will hang or catch.  Recommend continued exercises braces/bracing for heavy activity and at work for 3 months come back in 3 months.  If she does not like the way her knee is of course we can fix her ACL  Encounter Diagnoses  Name Primary?  . S/P right knee arthroscopy 04/25/17 Yes  . Rupture of anterior cruciate ligament of right knee, subsequent encounter   . Derangement of posterior horn of medial meniscus of right knee

## 2017-07-28 ENCOUNTER — Encounter: Payer: 59 | Admitting: Nurse Practitioner

## 2017-08-12 ENCOUNTER — Encounter: Payer: 59 | Admitting: Family Medicine

## 2017-08-14 ENCOUNTER — Encounter: Payer: Self-pay | Admitting: Nurse Practitioner

## 2017-08-14 ENCOUNTER — Ambulatory Visit (INDEPENDENT_AMBULATORY_CARE_PROVIDER_SITE_OTHER): Payer: 59 | Admitting: Nurse Practitioner

## 2017-08-14 VITALS — BP 132/82 | Ht 67.0 in | Wt 141.4 lb

## 2017-08-14 DIAGNOSIS — Z0000 Encounter for general adult medical examination without abnormal findings: Secondary | ICD-10-CM

## 2017-08-14 DIAGNOSIS — Z131 Encounter for screening for diabetes mellitus: Secondary | ICD-10-CM | POA: Diagnosis not present

## 2017-08-14 DIAGNOSIS — R5383 Other fatigue: Secondary | ICD-10-CM | POA: Diagnosis not present

## 2017-08-14 DIAGNOSIS — Z13220 Encounter for screening for lipoid disorders: Secondary | ICD-10-CM | POA: Diagnosis not present

## 2017-08-14 DIAGNOSIS — Z1231 Encounter for screening mammogram for malignant neoplasm of breast: Secondary | ICD-10-CM | POA: Diagnosis not present

## 2017-08-15 ENCOUNTER — Encounter: Payer: Self-pay | Admitting: Nurse Practitioner

## 2017-08-15 DIAGNOSIS — Z13220 Encounter for screening for lipoid disorders: Secondary | ICD-10-CM | POA: Diagnosis not present

## 2017-08-15 DIAGNOSIS — R5383 Other fatigue: Secondary | ICD-10-CM | POA: Diagnosis not present

## 2017-08-15 DIAGNOSIS — Z131 Encounter for screening for diabetes mellitus: Secondary | ICD-10-CM | POA: Diagnosis not present

## 2017-08-15 NOTE — Progress Notes (Signed)
   Subjective:    Patient ID: Natalie Hess, female    DOB: 27-Sep-1960, 57 y.o.   MRN: [id number]  HPI presents for her wellness exam. Still has one ovary. No pelvic pain. Same sexual partner. Very active with regular exercise. Gets regular skin cancer screening. Her mother died from melanoma. Regular dental exams. Eye exam planned.     Review of Systems  Constitutional: Positive for fatigue. Negative for activity change and appetite change.  HENT: Negative for dental problem, ear pain, sinus pressure and sore throat.   Respiratory: Negative for cough, chest tightness, shortness of breath and wheezing.   Cardiovascular: Negative for chest pain.  Gastrointestinal: Negative for abdominal distention, abdominal pain, blood in stool, constipation, diarrhea, nausea and vomiting.  Genitourinary: Negative for difficulty urinating, dysuria, enuresis, frequency, genital sores, pelvic pain, urgency and vaginal discharge.   Depression screen PHQ 2/9 08/14/2017  Decreased Interest 0  Down, Depressed, Hopeless 0  PHQ - 2 Score 0        Objective:   Physical Exam  Constitutional: She is oriented to person, place, and time. She appears well-developed. No distress.  HENT:  Right Ear: External ear normal.  Left Ear: External ear normal.  Mouth/Throat: Oropharynx is clear and moist.  Neck: Normal range of motion. Neck supple. No tracheal deviation present. No thyromegaly present.  Cardiovascular: Normal rate, regular rhythm and normal heart sounds. Exam reveals no gallop.  No murmur heard. Pulmonary/Chest: Effort normal and breath sounds normal. Right breast exhibits no inverted nipple, no mass, no skin change and no tenderness. Left breast exhibits no inverted nipple, no mass, no skin change and no tenderness. Breasts are symmetrical.  Axillae no adenopathy.   Abdominal: Soft. She exhibits no distension. There is no tenderness.  Genitourinary: Vagina normal. No vaginal discharge found.  Genitourinary  Comments: External GU: No rashes or lesions.  Vagina no discharge.  Bimanual exam no tenderness or obvious masses.  Musculoskeletal: She exhibits no edema.  Lymphadenopathy:    She has no cervical adenopathy.  Neurological: She is alert and oriented to person, place, and time.  Skin: Skin is warm and dry. No rash noted.  Psychiatric: She has a normal mood and affect. Her behavior is normal.          Assessment & Plan:  Screening mammogram, encounter for - Plan: MM DIGITAL SCREENING BILATERAL  Screening, lipid - Plan: Lipid panel, Hepatic function panel  Fatigue, unspecified type - Plan: TSH, VITAMIN D 25 Hydroxy (Vit-D Deficiency, Fractures)  Encounter for screening examination for impaired glucose regulation and diabetes mellitus - Plan: Basic metabolic panel  Encouraged continued exercise and healthy diet. Labs pending. Recommend daily vitamin D and calcium.  Return in about 1 year (around 08/15/2018) for physical.

## 2017-08-16 LAB — BASIC METABOLIC PANEL
BUN/Creatinine Ratio: 20 (ref 9–23)
BUN: 15 mg/dL (ref 6–24)
CO2: 24 mmol/L (ref 20–29)
Calcium: 9.5 mg/dL (ref 8.7–10.2)
Chloride: 99 mmol/L (ref 96–106)
Creatinine, Ser: 0.75 mg/dL (ref 0.57–1.00)
GFR calc Af Amer: 102 mL/min/{1.73_m2} (ref >59)
GFR calc non Af Amer: 89 mL/min/{1.73_m2} (ref >59)
GLUCOSE: 96 mg/dL (ref 65–99)
POTASSIUM: 4.2 mmol/L (ref 3.5–5.2)
SODIUM: 139 mmol/L (ref 134–144)

## 2017-08-16 LAB — VITAMIN D 25 HYDROXY (VIT D DEFICIENCY, FRACTURES): VIT D 25 HYDROXY: 36.5 ng/mL (ref 30.0–100.0)

## 2017-08-16 LAB — TSH: TSH: 0.948 u[IU]/mL (ref 0.450–4.500)

## 2017-08-16 LAB — LIPID PANEL
CHOLESTEROL TOTAL: 200 mg/dL — AB (ref 100–199)
Chol/HDL Ratio: 1.9 ratio (ref 0.0–4.4)
HDL: 105 mg/dL (ref >39)
LDL Calculated: 85 mg/dL (ref 0–99)
Triglycerides: 50 mg/dL (ref 0–149)
VLDL CHOLESTEROL CAL: 10 mg/dL (ref 5–40)

## 2017-08-16 LAB — HEPATIC FUNCTION PANEL
ALK PHOS: 61 IU/L (ref 39–117)
ALT: 16 IU/L (ref 0–32)
AST: 23 IU/L (ref 0–40)
Albumin: 4.6 g/dL (ref 3.5–5.5)
Bilirubin Total: 0.5 mg/dL (ref 0.0–1.2)
Bilirubin, Direct: 0.14 mg/dL (ref 0.00–0.40)
Total Protein: 6.6 g/dL (ref 6.0–8.5)

## 2017-08-17 ENCOUNTER — Encounter: Payer: Self-pay | Admitting: Nurse Practitioner

## 2017-08-18 ENCOUNTER — Ambulatory Visit (HOSPITAL_COMMUNITY)
Admission: RE | Admit: 2017-08-18 | Discharge: 2017-08-18 | Disposition: A | Discharge status: Home / Self Care | Payer: 59 | Source: Ambulatory Visit | Attending: Nurse Practitioner | Admitting: Nurse Practitioner

## 2017-08-18 DIAGNOSIS — Z1231 Encounter for screening mammogram for malignant neoplasm of breast: Secondary | ICD-10-CM | POA: Diagnosis not present

## 2017-09-01 ENCOUNTER — Encounter: Payer: Self-pay | Admitting: Nurse Practitioner

## 2017-09-01 ENCOUNTER — Ambulatory Visit (INDEPENDENT_AMBULATORY_CARE_PROVIDER_SITE_OTHER): Payer: 59 | Admitting: Nurse Practitioner

## 2017-09-01 VITALS — BP 122/90 | Temp 98.0°F | Ht 67.0 in | Wt 140.8 lb

## 2017-09-01 DIAGNOSIS — R300 Dysuria: Secondary | ICD-10-CM

## 2017-09-01 LAB — POCT URINALYSIS DIPSTICK
Spec Grav, UA: <=1.005 — AB (ref 1.010–1.025)
pH, UA: 5 (ref 5.0–8.0)

## 2017-09-01 LAB — POCT UA - MICROSCOPIC ONLY: BACTERIA, U MICROSCOPIC: NEGATIVE

## 2017-09-01 MED ORDER — CIPROFLOXACIN HCL 500 MG PO TABS: 500.0000 mg | ORAL_TABLET | Freq: Two times a day (BID) | ORAL | 0 refills | Status: DC

## 2017-09-01 NOTE — Patient Instructions (Addendum)
AZO as directed for 48 hours then discontinue

## 2017-09-01 NOTE — Progress Notes (Signed)
Subjective: Presents for complaints of dysuria urgency and frequency that began 3 days ago.  No fever.  No nausea vomiting.  No flank or back pain.  Pelvic area pressure.  Same sexual partner.  Has not had a UTI in over 15 years.  Taking large amounts of water.  Objective:   BP 122/90   Temp 98 F (36.7 C) (Oral)   Ht 5\' 7"  (1.702 m)   Wt 140 lb 12.8 oz (63.9 kg)   BMI 22.05 kg/m  NAD.  Alert, oriented.  Lungs clear.  Heart regular rate and rhythm.  No CVA tenderness.  Abdomen soft nondistended with mild suprapubic area discomfort on exam. Results for orders placed or performed in visit on 09/01/17  POCT Urinalysis Dipstick  Result Value Ref Range   Color, UA     Clarity, UA     Glucose, UA  Negative   Bilirubin, UA     Ketones, UA     Spec Grav, UA <=1.005 (A) 1.010 - 1.025   Blood, UA +    pH, UA 5.0 5.0 - 8.0   Protein, UA  Negative   Urobilinogen, UA  0.2 or 1.0 E.U./dL   Nitrite, UA     Leukocytes, UA Small (1+) (A) Negative   Appearance     Odor    POCT UA - Microscopic Only  Result Value Ref Range   WBC, Ur, HPF, POC 0-6    RBC, urine, microscopic rare    Bacteria, U Microscopic neg    Mucus, UA     Epithelial cells, urine per micros rare    Crystals, Ur, HPF, POC     Casts, Ur, LPF, POC     Yeast, UA     Note urine was very dilute.  Assessment:  Dysuria - Plan: POCT Urinalysis Dipstick, POCT UA - Microscopic Only    Plan:   Meds ordered this encounter  Medications  . ciprofloxacin (CIPRO) 500 MG tablet    Sig: Take 1 tablet (500 mg total) by mouth 2 (two) times daily.    Dispense:  14 tablet    Refill:  0    Order Specific Question:   Supervising Provider    Answer:   Mikey Kirschner [[id number]]   AZO as directed for 48 hours then discontinue.  Warning signs reviewed.  Call back in 72 hours if no improvement, sooner if worse.

## 2017-09-19 DIAGNOSIS — H52223 Regular astigmatism, bilateral: Secondary | ICD-10-CM | POA: Diagnosis not present

## 2017-09-19 DIAGNOSIS — H524 Presbyopia: Secondary | ICD-10-CM | POA: Diagnosis not present

## 2017-09-19 DIAGNOSIS — H5203 Hypermetropia, bilateral: Secondary | ICD-10-CM | POA: Diagnosis not present

## 2017-10-29 ENCOUNTER — Ambulatory Visit: Payer: 59 | Admitting: Orthopedic Surgery

## 2017-11-03 ENCOUNTER — Ambulatory Visit (INDEPENDENT_AMBULATORY_CARE_PROVIDER_SITE_OTHER): Payer: 59 | Admitting: Orthopedic Surgery

## 2017-11-03 VITALS — BP 143/88 | HR 69 | Ht 66.0 in | Wt 140.0 lb

## 2017-11-03 DIAGNOSIS — Z9889 Other specified postprocedural states: Secondary | ICD-10-CM | POA: Diagnosis not present

## 2017-11-03 DIAGNOSIS — M23321 Other meniscus derangements, posterior horn of medial meniscus, right knee: Secondary | ICD-10-CM | POA: Diagnosis not present

## 2017-11-03 NOTE — Progress Notes (Signed)
Chief Complaint  Patient presents with  . Follow-up    Recheck on right knee, DOS 04-25-17.   PRE-OPERATIVE DIAGNOSIS:  ACL and medial meniscal tear right knee POST-OPERATIVE DIAGNOSIS:  ACL and medial meniscal tear right knee PROCEDURE:  Procedure(s): KNEE ARTHROSCOPY WITH PARTIAL MEDIAL MENISECTOMY; ACL DEBRIEDMENT (Right)  Exam under anesthesia: 2+ Lockman grade 1 pivot full range of motion  Intra-Op torn ACL mid substance,  posterior horn medial meniscus tear,  chondral lesion medial femoral condyle,  chondromalacia lateral tibial plateau and patella  She says her knee is doing very well she is accommodated nicely by limiting her activities that would put the knee at risk  She has no pain or swelling  The knee looks very quiet her right to left Lockman test shows subtle difference with increased laxity on the right she has 130 degrees of knee flexion collateral ligaments are stable  Encounter Diagnoses  Name Primary?  . S/P right knee arthroscopy 04/25/17 Yes  . Derangement of posterior horn of medial meniscus of right knee     Follow-up at 1 year routine check

## 2017-11-18 DIAGNOSIS — Z1283 Encounter for screening for malignant neoplasm of skin: Secondary | ICD-10-CM | POA: Diagnosis not present

## 2017-11-18 DIAGNOSIS — D225 Melanocytic nevi of trunk: Secondary | ICD-10-CM | POA: Diagnosis not present

## 2017-12-15 ENCOUNTER — Encounter: Payer: Self-pay | Admitting: Orthopedic Surgery

## 2017-12-15 ENCOUNTER — Ambulatory Visit (HOSPITAL_COMMUNITY)
Admission: RE | Admit: 2017-12-15 | Discharge: 2017-12-15 | Disposition: A | Discharge status: Home / Self Care | Payer: 59 | Source: Ambulatory Visit | Attending: Orthopedic Surgery | Admitting: Orthopedic Surgery

## 2017-12-15 ENCOUNTER — Ambulatory Visit (INDEPENDENT_AMBULATORY_CARE_PROVIDER_SITE_OTHER): Payer: 59 | Admitting: Orthopedic Surgery

## 2017-12-15 ENCOUNTER — Ambulatory Visit (INDEPENDENT_AMBULATORY_CARE_PROVIDER_SITE_OTHER): Payer: 59

## 2017-12-15 VITALS — BP 141/86 | HR 86 | Ht 66.0 in | Wt 143.0 lb

## 2017-12-15 DIAGNOSIS — S8991XA Unspecified injury of right lower leg, initial encounter: Secondary | ICD-10-CM

## 2017-12-15 DIAGNOSIS — S8001XA Contusion of right knee, initial encounter: Secondary | ICD-10-CM | POA: Insufficient documentation

## 2017-12-15 DIAGNOSIS — X58XXXA Exposure to other specified factors, initial encounter: Secondary | ICD-10-CM | POA: Diagnosis not present

## 2017-12-15 DIAGNOSIS — S83281A Other tear of lateral meniscus, current injury, right knee, initial encounter: Secondary | ICD-10-CM | POA: Diagnosis not present

## 2017-12-15 DIAGNOSIS — M25561 Pain in right knee: Secondary | ICD-10-CM | POA: Insufficient documentation

## 2017-12-15 DIAGNOSIS — S83241A Other tear of medial meniscus, current injury, right knee, initial encounter: Secondary | ICD-10-CM | POA: Insufficient documentation

## 2017-12-15 DIAGNOSIS — S83511A Sprain of anterior cruciate ligament of right knee, initial encounter: Secondary | ICD-10-CM

## 2017-12-15 NOTE — Progress Notes (Signed)
Chief Complaint  Patient presents with  . 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  She recovered well until Friday, 27 September she stepped off of her step her knee gave out acute pain and swelling.  Location right knee lateral joint quality dull ache throb severity 7-10 duration as stated timing pain is constant associated with swelling and instability   1:43 PM  PATIENT:  Natalie Hess  57 y.o. female  PRE-OPERATIVE DIAGNOSIS:  ACL and medial meniscal tear right knee  POST-OPERATIVE DIAGNOSIS:  ACL and medial meniscal tear right knee  PROCEDURE:  Procedure(s): KNEE ARTHROSCOPY WITH PARTIAL MEDIAL MENISECTOMY; ACL DEBRIEDMENT (Right)   Exam under anesthesia: 2+ Lockman grade 1 pivot full range of motion  Intra-Op torn ACL mid substance,  posterior horn medial meniscus tear,  chondral lesion medial femoral condyle,  chondromalacia lateral tibial plateau and patella  Past Medical History:  Diagnosis Date  . Medical history non-contributory   . PONV (postoperative nausea and vomiting)    Review of Systems  Musculoskeletal: Positive for joint pain.  Neurological: Negative for tingling.  All other systems reviewed and are negative.  Past Surgical History:  Procedure Laterality Date  . ABDOMINAL HYSTERECTOMY    . CESAREAN SECTION     X 2  . COLONOSCOPY N/A 02/17/2015   Procedure: COLONOSCOPY;  Surgeon: Rogene Houston, MD;  Location[facility];  Service: Endoscopy;  Laterality: N/A;  930  . KNEE ARTHROSCOPY WITH MEDIAL MENISECTOMY Right 04/24/2017   Procedure: KNEE ARTHROSCOPY WITH PARTIAL MEDIAL MENISECTOMY; ACL DEBRIEDMENT;  Surgeon: Carole Civil, MD;  Location: [facility];  Service: Orthopedics;  Laterality: Right;     BP (!) 141/86   Pulse 86   Ht 5\' 6"  (1.676 m)   Wt 143 lb (64.9 kg)   BMI 23.08 kg/m   Physical Exam  Constitutional: She is  oriented to person, place, and time. She appears well-developed and well-nourished.  Neurological: She is alert and oriented to person, place, and time.  Psychiatric: She has a normal mood and affect. Judgment normal.  Vitals reviewed.  Gait is associated with a limp even in a brace  Left knee inspection reveals no tenderness or swelling and full range of motion the knee is stable when all ligaments are tested strength and muscle tone are normal skin is intact sensation is normal her mental status shows normal mood and affect and she is oriented x3  Right knee shows a large joint effusion tenderness over the lateral joint line her range of motion is limited to 50 degrees of flexion she does not come to full extension she has a positive Lockman grade 2 could not do a pivot collateral ligaments were stable muscle tone was normal there was no tremor skin was warm dry and intact sensation was normal she had a good distal pulse  Encounter Diagnoses  Name Primary?  . Knee injury, right, initial encounter   . Rupture of anterior cruciate ligament of right knee, initial encounter Yes    Recommend aspiration of the right knee  Verbal consent was given site was confirmed his right knee leg was cleaned with alcohol 18-gauge needle aspirated 60 cc of bloody fluid  Recommend MRI right knee  Half day work only I replaced her brace which had come apart.  She will need to work in the brace  I  will see her after MRI to plan further surgical treatment

## 2017-12-15 NOTE — Patient Instructions (Signed)
Work restrictions half day only with brace on

## 2017-12-17 ENCOUNTER — Other Ambulatory Visit: Payer: Self-pay | Admitting: Orthopedic Surgery

## 2017-12-17 ENCOUNTER — Telehealth: Payer: Self-pay | Admitting: Orthopedic Surgery

## 2017-12-17 ENCOUNTER — Ambulatory Visit: Payer: 59 | Admitting: Orthopedic Surgery

## 2017-12-17 NOTE — Telephone Encounter (Signed)
I will call patient and post for ACL repair, allograft Achilles, lateral and medial meniscectomy   Thursday

## 2017-12-17 NOTE — Telephone Encounter (Signed)
I called her and posted case she agrees to Thursday Oct 10th

## 2017-12-17 NOTE — Telephone Encounter (Signed)
Patient called and stated she wants to go ahead and schedule surgery as soon as possible.Per patient hopefullyTuesday.  Please call and advise

## 2017-12-18 ENCOUNTER — Other Ambulatory Visit: Payer: Self-pay

## 2017-12-18 ENCOUNTER — Other Ambulatory Visit (HOSPITAL_COMMUNITY): Payer: Self-pay

## 2017-12-18 ENCOUNTER — Encounter (HOSPITAL_COMMUNITY)
Admission: RE | Admit: 2017-12-18 | Discharge: 2017-12-18 | Disposition: A | Discharge status: Home / Self Care | Payer: 59 | Source: Ambulatory Visit | Attending: Orthopedic Surgery | Admitting: Orthopedic Surgery

## 2017-12-18 ENCOUNTER — Telehealth: Payer: Self-pay

## 2017-12-18 ENCOUNTER — Encounter (HOSPITAL_COMMUNITY): Payer: Self-pay

## 2017-12-18 DIAGNOSIS — Z01812 Encounter for preprocedural laboratory examination: Secondary | ICD-10-CM | POA: Insufficient documentation

## 2017-12-18 DIAGNOSIS — E876 Hypokalemia: Secondary | ICD-10-CM

## 2017-12-18 LAB — BASIC METABOLIC PANEL
Anion gap: 5 (ref 5–15)
BUN: 16 mg/dL (ref 6–20)
CALCIUM: 9.3 mg/dL (ref 8.9–10.3)
CHLORIDE: 99 mmol/L (ref 98–111)
CO2: 32 mmol/L (ref 22–32)
Creatinine, Ser: 0.87 mg/dL (ref 0.44–1.00)
GFR calc non Af Amer: >60 mL/min (ref >60)
Glucose, Bld: 135 mg/dL — ABNORMAL HIGH (ref 70–99)
Potassium: 2.9 mmol/L — ABNORMAL LOW (ref 3.5–5.1)
SODIUM: 136 mmol/L (ref 135–145)

## 2017-12-18 LAB — CBC WITH DIFFERENTIAL/PLATELET
BASOS PCT: 1 %
Basophils Absolute: 0 10*3/uL (ref 0.0–0.1)
EOS ABS: 0.1 10*3/uL (ref 0.0–0.7)
Eosinophils Relative: 3 %
HCT: 40.9 % (ref 36.0–46.0)
HEMOGLOBIN: 13.8 g/dL (ref 12.0–15.0)
Lymphocytes Relative: 22 %
Lymphs Abs: 1 10*3/uL (ref 0.7–4.0)
MCH: 30.2 pg (ref 26.0–34.0)
MCHC: 33.7 g/dL (ref 30.0–36.0)
MCV: 89.5 fL (ref 78.0–100.0)
MONOS PCT: 5 %
Monocytes Absolute: 0.2 10*3/uL (ref 0.1–1.0)
NEUTROS PCT: 69 %
Neutro Abs: 3.2 10*3/uL (ref 1.7–7.7)
Platelets: 231 10*3/uL (ref 150–400)
RBC: 4.57 MIL/uL (ref 3.87–5.11)
RDW: 11.9 % (ref 11.5–15.5)
WBC: 4.6 10*3/uL (ref 4.0–10.5)

## 2017-12-18 MED ORDER — POTASSIUM CHLORIDE CRYS ER 20 MEQ PO TBCR: EXTENDED_RELEASE_TABLET | ORAL | 0 refills | Status: DC

## 2017-12-18 NOTE — Telephone Encounter (Signed)
Potassium 20 mEq K-Dur Take 2 pills this evening On Friday  take 2 pills in the morning 2 in the evening On Saturday Sunday Monday Tuesday 1 daily Repeat metabolic 7, also check magnesium-diagnosis hypokalemia on Monday Office visit with Dr. Richardson Landry on Tuesday Please send in medication

## 2017-12-18 NOTE — Pre-Procedure Instructions (Signed)
Potassium of 2.9 routed to Dr Aline Brochure. Patient is also going to call  Her PCP and see if she can get RX for potassium.

## 2017-12-18 NOTE — Patient Instructions (Signed)
Natalie Hess  12/18/2017     @PREFPERIOPPHARMACY @   Your procedure is scheduled on  12/25/2017   Report to [facility] at  28   A.M.  Call this number if you have problems the morning of surgery:  [phone]   Remember:  Do not eat or drink after midnight.  You may drink clear liquids until  12 midnight 12/24/2017 .  Clear liquids allowed are:                    Water, Juice (non-citric and without pulp), Carbonated beverages, Clear Tea, Black Coffee only, Plain Jell-O only, Gatorade and Plain Popsicles only    Take these medicines the morning of surgery with A SIP OF WATER  None    Do not wear jewelry, make-up or nail polish.  Do not wear lotions, powders, or perfumes, or deodorant.  Do not shave 48 hours prior to surgery.  Men may shave face and neck.  Do not bring valuables to the hospital.  [facility] is not responsible for any belongings or valuables.  Contacts, dentures or bridgework may not be worn into surgery.  Leave your suitcase in the car.  After surgery it may be brought to your room.  For patients admitted to the hospital, discharge time will be determined by your treatment team.  Patients discharged the day of surgery will not be allowed to drive home.   Name and phone number of your driver:   family Special instructions:  None  Please read over the following fact sheets that you were given. Anesthesia Post-op Instructions and Care and Recovery After Surgery       Anterior Cruciate Ligament Reconstruction A ligament is a strong, cord-like band of tissue that connects one bone to another bone. The anterior cruciate ligament (ACL) connects one of your lower leg bones to your upper leg bone. ACL reconstruction is a surgery to replace a torn ACL. During ACL reconstruction, your torn ACL is replaced with tissue from a ligament in another part of your body. Tell a health care provider about:  Any allergies you have.  All medicines you are  taking, including vitamins, herbs, eye drops, creams, and over-the-counter medicines.  Any problems you or family members have had with anesthetic medicines.  Any blood disorders you have.  Any surgeries you have had.  Any medical conditions you have.  Any history of tobacco use. What are the risks? Generally, this is a safe procedure. However, problems may occur, including:  Infection.  Bleeding.  Allergic reactions to medicines.  Damage to other structures or organs.  Failure of the procedure.  Knee stiffness.  What happens before the procedure?  Follow instructions from your health care provider about eating or drinking restrictions.  Ask your health care provider about: ? Changing or stopping your regular medicines. This is especially important if you are taking diabetes medicines or blood thinners. ? Taking medicines such as aspirin and ibuprofen. These medicines can thin your blood. Do not take these medicines before your procedure if your health care provider instructs you not to.  You may be given antibiotic medicine to help prevent infection.  Ask your health care provider how your surgical site will be marked or identified.  Plan to have someone take you home after the procedure. What happens during the procedure?  To reduce your risk of infection: ? Your health care team will wash or sanitize  their hands. ? Your skin will be washed with soap. ? Hair may be removed from the surgical area.  You will be given one or more of the following: ? A medicine to help you relax (sedative). ? A medicine to numb the area (local anesthetic). ? A medicine to make you fall asleep (general anesthetic). ? A medicine that is injected into your spine to numb the area below and slightly above the injection site (spinal anesthetic). ? A medicine that is injected into an area of your body to numb everything below the injection site (regional anesthetic).  A small cut  (incision) will be made in your knee.  A thin, flexible, tube-like instrument with a camera on the end (arthroscope) will be inserted into your knee joint.  Your torn ACL will be removed.  Tunnels in your upper tibia and femur will be created with large drills over guide wires or guide pins.  The graft will be pulled into the tunnels.  The graft will be inserted into your knee joint.  The graft will be secured in place in the tunnels with screws.  The incision will be closed with stitches (sutures), skin glue, or skin tape (adhesive) strips.  The incision may be covered with a bandage (dressing). The procedure may vary among health care providers and hospitals. What happens after the procedure?  Your blood pressure, heart rate, breathing rate, and blood oxygen level will be monitored often until the medicines you were given have worn off.  Do not drive for 24 hours if you received a sedative. This information is not intended to replace advice given to you by your health care provider. Make sure you discuss any questions you have with your health care provider. Document Released: 01/02/2004 Document Revised: 08/10/2015 Document Reviewed: 11/27/2014 Elsevier Interactive Patient Education  2018 [place].  Anterior Cruciate Ligament Reconstruction, Care After Refer to this sheet in the next few weeks. These instructions provide you with information about caring for yourself after your procedure. Your health care provider may also give you more specific instructions. Your treatment has been planned according to current medical practices, but problems sometimes occur. Call your health care provider if you have any problems or questions after your procedure. What can I expect after the procedure? After your procedure, it is common to have soreness in the area where the surgical cut (incision) was made. Follow these instructions at home: If you have a splint or brace:  Wear the splint  or brace as told by your health care provider. Remove it only as told by your health care provider.  Loosen the splint or brace if your toes tingle, become numb, or turn cold and blue.  Do not let your splint or brace get wet if it is not waterproof.  Keep the splint or brace clean. Bathing  Do not take baths, swim, or use a hot tub until your health care provider approves. Ask your health care provider if you can take showers.  If your splint or brace is not waterproof, cover it with a watertight plastic bag when you take a bath or a shower.  Keep the bandage (dressing) dry until your health care provider says it can be removed. Incision care  Follow instructions from your health care provider about how to take care of your incision. Make sure you: ? Wash your hands with soap and water before you change your bandage. If soap and water are not available, use hand sanitizer. ? Change your  dressing as told by your health care provider. ? Leave stitches (sutures), skin glue, or adhesive strips in place. These skin closures may need to stay in place for 2 weeks or longer. If adhesive strip edges start to loosen and curl up, you may trim the loose edges. Do not remove adhesive strips completely unless your health care provider tells you to do that.  Check your incision area every day for signs of infection. Check for: ? More redness, swelling, or pain. ? More fluid or blood. ? Warmth. ? Pus or a bad smell. Managing pain, stiffness, and swelling  If directed, apply ice to the affected area: ? Put ice in a plastic bag. ? Place a towel between your skin and the bag. ? Leave the ice on for 20 minutes, 2-3 times per day.  Move your toes often to avoid stiffness and to lessen swelling.  Raise (elevate) the affected area above the level of your heart while you are sitting or lying down. Driving  Do not drive or operate heavy machinery while taking prescription pain medicine.  Do not drive  for 24 hours if you received a sedative.  Ask your health care provider when it is safe to drive if you have a splint or brace on your leg. Activity  Return to your normal activities as told by your health care provider. Ask your health care provider what activities are safe for you.  Do not exercise your leg unless instructed to do so by your health care provider. General instructions  Do not use the injured limb to support your body weight until your health care provider says that you can. Use crutches as told by your health care provider.  Take over-the-counter and prescription medicines only as told by your health care provider.  Keep all follow-up visits as told by your health care provider. This is important. Contact a health care provider if:  You have more redness, swelling, or pain around your incision.  You have more fluid or blood coming from your incision.  Your incision feels warm to the touch.  You have pus or a bad smell coming from your incision.  Any of your incisions break open after sutures or staples have been removed. Get help right away if:  You feel pain when you move your knee.  You have a lot of pain in your leg when you move your foot up and down at your ankle.  You have a fever. This information is not intended to replace advice given to you by your health care provider. Make sure you discuss any questions you have with your health care provider. Document Released: 09/21/2004 Document Revised: 01/26/2016 Document Reviewed: 11/27/2014 Elsevier Interactive Patient Education  2017 [place] Anesthesia, Adult General anesthesia is the use of medicines to make a person "go to sleep" (be unconscious) for a medical procedure. General anesthesia is often recommended when a procedure:  Is long.  Requires you to be still or in an unusual position.  Is major and can cause you to lose blood.  Is impossible to do without general  anesthesia.  The medicines used for general anesthesia are called general anesthetics. In addition to making you sleep, the medicines:  Prevent pain.  Control your blood pressure.  Relax your muscles.  Tell a health care provider about:  Any allergies you have.  All medicines you are taking, including vitamins, herbs, eye drops, creams, and over-the-counter medicines.  Any problems you or family members  have had with anesthetic medicines.  Types of anesthetics you have had in the past.  Any bleeding disorders you have.  Any surgeries you have had.  Any medical conditions you have.  Any history of heart or lung conditions, such as heart failure, sleep apnea, or chronic obstructive pulmonary disease (COPD).  Whether you are pregnant or may be pregnant.  Whether you use tobacco, alcohol, marijuana, or street drugs.  Any history of Armed forces logistics/support/administrative officer.  Any history of depression or anxiety. What are the risks? Generally, this is a safe procedure. However, problems may occur, including:  Allergic reaction to anesthetics.  Lung and heart problems.  Inhaling food or liquids from your stomach into your lungs (aspiration).  Injury to nerves.  Waking up during your procedure and being unable to move (rare).  Extreme agitation or a state of mental confusion (delirium) when you wake up from the anesthetic.  Air in the bloodstream, which can lead to stroke.  These problems are more likely to develop if you are having a major surgery or if you have an advanced medical condition. You can prevent some of these complications by answering all of your health care provider's questions thoroughly and by following all pre-procedure instructions. General anesthesia can cause side effects, including:  Nausea or vomiting  A sore throat from the breathing tube.  Feeling cold or shivery.  Feeling tired, washed out, or achy.  Sleepiness or drowsiness.  Confusion or  agitation.  What happens before the procedure? Staying hydrated Follow instructions from your health care provider about hydration, which may include:  Up to 2 hours before the procedure - you may continue to drink clear liquids, such as water, clear fruit juice, black coffee, and plain tea.  Eating and drinking restrictions Follow instructions from your health care provider about eating and drinking, which may include:  8 hours before the procedure - stop eating heavy meals or foods such as meat, fried foods, or fatty foods.  6 hours before the procedure - stop eating light meals or foods, such as toast or cereal.  6 hours before the procedure - stop drinking milk or drinks that contain milk.  2 hours before the procedure - stop drinking clear liquids.  Medicines  Ask your health care provider about: ? Changing or stopping your regular medicines. This is especially important if you are taking diabetes medicines or blood thinners. ? Taking medicines such as aspirin and ibuprofen. These medicines can thin your blood. Do not take these medicines before your procedure if your health care provider instructs you not to. ? Taking new dietary supplements or medicines. Do not take these during the week before your procedure unless your health care provider approves them.  If you are told to take a medicine or to continue taking a medicine on the day of the procedure, take the medicine with sips of water. General instructions   Ask if you will be going home the same day, the following day, or after a longer hospital stay. ? Plan to have someone take you home. ? Plan to have someone stay with you for the first 24 hours after you leave the hospital or clinic.  For 3-6 weeks before the procedure, try not to use any tobacco products, such as cigarettes, chewing tobacco, and e-cigarettes.  You may brush your teeth on the morning of the procedure, but make sure to spit out the toothpaste. What  happens during the procedure?  You will be given anesthetics through a  mask and through an IV tube in one of your veins.  You may receive medicine to help you relax (sedative).  As soon as you are asleep, a breathing tube may be used to help you breathe.  An anesthesia specialist will stay with you throughout the procedure. He or she will help keep you comfortable and safe by continuing to give you medicines and adjusting the amount of medicine that you get. He or she will also watch your blood pressure, pulse, and oxygen levels to make sure that the anesthetics do not cause any problems.  If a breathing tube was used to help you breathe, it will be removed before you wake up. The procedure may vary among health care providers and hospitals. What happens after the procedure?  You will wake up, often slowly, after the procedure is complete, usually in a recovery area.  Your blood pressure, heart rate, breathing rate, and blood oxygen level will be monitored until the medicines you were given have worn off.  You may be given medicine to help you calm down if you feel anxious or agitated.  If you will be going home the same day, your health care provider may check to make sure you can stand, drink, and urinate.  Your health care providers will treat your pain and side effects before you go home.  Do not drive for 24 hours if you received a sedative.  You may: ? Feel nauseous and vomit. ? Have a sore throat. ? Have mental slowness. ? Feel cold or shivery. ? Feel sleepy. ? Feel tired. ? Feel sore or achy, even in parts of your body where you did not have surgery. This information is not intended to replace advice given to you by your health care provider. Make sure you discuss any questions you have with your health care provider. Document Released: 06/11/2007 Document Revised: 08/15/2015 Document Reviewed: 02/16/2015 Elsevier Interactive Patient Education  2018 [place]  Anesthesia, Adult, Care After These instructions provide you with information about caring for yourself after your procedure. Your health care provider may also give you more specific instructions. Your treatment has been planned according to current medical practices, but problems sometimes occur. Call your health care provider if you have any problems or questions after your procedure. What can I expect after the procedure? After the procedure, it is common to have:  Vomiting.  A sore throat.  Mental slowness.  It is common to feel:  Nauseous.  Cold or shivery.  Sleepy.  Tired.  Sore or achy, even in parts of your body where you did not have surgery.  Follow these instructions at home: For at least 24 hours after the procedure:  Do not: ? Participate in activities where you could fall or become injured. ? Drive. ? Use heavy machinery. ? Drink alcohol. ? Take sleeping pills or medicines that cause drowsiness. ? Make important decisions or sign legal documents. ? Take care of children on your own.  Rest. Eating and drinking  If you vomit, drink water, juice, or soup when you can drink without vomiting.  Drink enough fluid to keep your urine clear or pale yellow.  Make sure you have little or no nausea before eating solid foods.  Follow the diet recommended by your health care provider. General instructions  Have a responsible adult stay with you until you are awake and alert.  Return to your normal activities as told by your health care provider. Ask your health care provider  what activities are safe for you.  Take over-the-counter and prescription medicines only as told by your health care provider.  If you smoke, do not smoke without supervision.  Keep all follow-up visits as told by your health care provider. This is important. Contact a health care provider if:  You continue to have nausea or vomiting at home, and medicines are not helpful.  You cannot  drink fluids or start eating again.  You cannot urinate after 8-12 hours.  You develop a skin rash.  You have fever.  You have increasing redness at the site of your procedure. Get help right away if:  You have difficulty breathing.  You have chest pain.  You have unexpected bleeding.  You feel that you are having a life-threatening or urgent problem. This information is not intended to replace advice given to you by your health care provider. Make sure you discuss any questions you have with your health care provider. Document Released: 06/10/2000 Document Revised: 08/07/2015 Document Reviewed: 02/16/2015 Elsevier Interactive Patient Education  [employer].

## 2017-12-18 NOTE — Telephone Encounter (Signed)
Patient called today and states she went for pre op labs this am and they just called to tell her that her potassium was low at 2.9 and suggested she call and ask to be started on a potassium pill. She has no hx of low K in the past, she is not on a diuretic, she is not symptomatic.Her surgery /acl repair is scheduled for 12/25/2017.Please advise. [facility]

## 2017-12-18 NOTE — Telephone Encounter (Signed)
Medication sent to pharmacy, [facility] for recheck on Monday. Patient transferred up front schedule appt with Dr.Steve on next Tuesday.

## 2017-12-19 ENCOUNTER — Other Ambulatory Visit: Payer: Self-pay | Admitting: *Deleted

## 2017-12-19 ENCOUNTER — Telehealth: Payer: Self-pay | Admitting: Family Medicine

## 2017-12-19 DIAGNOSIS — E876 Hypokalemia: Secondary | ICD-10-CM

## 2017-12-19 MED ORDER — POTASSIUM CHLORIDE CRYS ER 20 MEQ PO TBCR: EXTENDED_RELEASE_TABLET | ORAL | 0 refills | Status: DC

## 2017-12-19 NOTE — Telephone Encounter (Signed)
Called rx into [facility]. She states their fax was down yesteday. Pt notified I called in rx and gave her directions on how to take med. Pt advised to do bw on Monday and keep appt on Tuesday. bw orders were already put in.

## 2017-12-19 NOTE — Telephone Encounter (Signed)
This rx was faxed to pharm yesterday. Pharm did not receive. How will her directions be since starting today and I will call this rx in instead of faxing to make sure she gets today.

## 2017-12-19 NOTE — Telephone Encounter (Signed)
Take 2 pills of the 20 mEq K-Dur this morning, 2 this evening On Saturday take 2 in the morning, 1 in the evening On Sunday Monday Tuesday 1/day Check met 7 AM magnesium on Monday follow-up with Dr. Richardson Landry on Tuesday Would be wise to call this him as well as sending in

## 2017-12-19 NOTE — Telephone Encounter (Signed)
Patient calling to check on status on potassium chloride SA (K-DUR,KLOR-CON) 20 MEQ tablet, patient stated pharmacy didn't have script last night to refill medication, she is going to check again this morning and call back and inform me if they have the medication this morning. Medication was faxed to [facility], [place] yesterday.  Pt returned call and they do not have med this A.M

## 2017-12-22 ENCOUNTER — Telehealth: Payer: Self-pay | Admitting: Radiology

## 2017-12-22 ENCOUNTER — Encounter: Payer: Self-pay | Admitting: Orthopedic Surgery

## 2017-12-22 DIAGNOSIS — E876 Hypokalemia: Secondary | ICD-10-CM | POA: Diagnosis not present

## 2017-12-22 NOTE — Telephone Encounter (Signed)
Spoke to her, she has already been taken care of by Dr Wolfgang Phoenix.

## 2017-12-22 NOTE — Telephone Encounter (Signed)
-----   Message from Carole Civil, MD sent at 12/19/2017 11:57 AM EDT ----- Call her in 20 meq K dur 3  X a day

## 2017-12-22 NOTE — Telephone Encounter (Signed)
Dr Wolfgang Phoenix has already sent in. I called her, left message. Looks like the Rx printed want to make sure she has this.

## 2017-12-23 ENCOUNTER — Encounter: Payer: Self-pay | Admitting: Family Medicine

## 2017-12-23 ENCOUNTER — Ambulatory Visit (INDEPENDENT_AMBULATORY_CARE_PROVIDER_SITE_OTHER): Payer: 59 | Admitting: Family Medicine

## 2017-12-23 DIAGNOSIS — E876 Hypokalemia: Secondary | ICD-10-CM

## 2017-12-23 LAB — BASIC METABOLIC PANEL
BUN/Creatinine Ratio: 14 (ref 9–23)
BUN: 11 mg/dL (ref 6–24)
CALCIUM: 9.4 mg/dL (ref 8.7–10.2)
CO2: 25 mmol/L (ref 20–29)
CREATININE: 0.79 mg/dL (ref 0.57–1.00)
Chloride: 97 mmol/L (ref 96–106)
GFR calc non Af Amer: 83 mL/min/{1.73_m2} (ref >59)
GFR, EST AFRICAN AMERICAN: 96 mL/min/{1.73_m2} (ref >59)
Glucose: 85 mg/dL (ref 65–99)
Potassium: 4.1 mmol/L (ref 3.5–5.2)
Sodium: 137 mmol/L (ref 134–144)

## 2017-12-23 LAB — MAGNESIUM: Magnesium: 2 mg/dL (ref 1.6–2.3)

## 2017-12-23 MED ORDER — POTASSIUM CHLORIDE CRYS ER 20 MEQ PO TBCR: EXTENDED_RELEASE_TABLET | ORAL | 0 refills | Status: DC

## 2017-12-23 NOTE — Progress Notes (Signed)
   Subjective:    Patient ID: Natalie Hess, female    DOB: 21-Oct-1960, 57 y.o.   MRN: [id number]  HPI  Patient arrives for a follow up on potassium. Patient having knee surgery 12/25/17.  Prior blood work reviewed.  6 months ago patient's potassium line.  On further history patient had protracted gastritis over several days prior to last blood work.  Based on this patient states she is never had another low potassium check in the past.  Not on any medications that normally drives a potassium level reports a balanced diet problem with cramps  Review of Systems No headache, no major weight loss or weight gain, no chest pain no back pain abdominal pain no change in bowel habits complete ROS otherwise negative     Objective:   Physical Exam   Alert vitals stable, NAD. Blood pressure good on repeat. HEENT normal. Lungs clear. Heart regular rate and rhythm.      Assessment & Plan:  Impression likely transient hypokalemia plan recommend finished 1 months worth of potassium supplement and may stop note patient facing orthopedic surgery shortly

## 2017-12-24 ENCOUNTER — Telehealth: Payer: Self-pay | Admitting: Family Medicine

## 2017-12-24 NOTE — Telephone Encounter (Signed)
error 

## 2017-12-24 NOTE — H&P (Signed)
Complaint  Patient presents with  . 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  She recovered well until Friday, 27 September she stepped off of her step her knee gave out acute pain and swelling.   Location right knee lateral joint quality dull ache throb severity 7-10 duration as stated timing pain is constant associated with swelling and instability     1:43 PM   PATIENT:  Natalie Hess  57 y.o. female   PRE-OPERATIVE DIAGNOSIS:  ACL and medial meniscal tear right knee   POST-OPERATIVE DIAGNOSIS:  ACL and medial meniscal tear right knee   PROCEDURE:  Procedure(s): KNEE ARTHROSCOPY WITH PARTIAL MEDIAL MENISECTOMY; ACL DEBRIEDMENT (Right)    Exam under anesthesia: 2+ Lockman grade 1 pivot full range of motion   Intra-Op torn ACL mid substance,  posterior horn medial meniscus tear,  chondral lesion medial femoral condyle,  chondromalacia lateral tibial plateau and patella    Family History  Problem Relation Age of Onset  . Melanoma Mother        died from metastatic disease     Social History   Tobacco Use  . Smoking status: Former Smoker    Types: Cigarettes  . Smokeless tobacco: Never Used  Substance Use Topics  . Alcohol use: Yes    Alcohol/week: 0.0 standard drinks    Comment: socially  . Drug use: No        Past Medical History:  Diagnosis Date  . Medical history non-contributory    . PONV (postoperative nausea and vomiting)      Review of Systems  Musculoskeletal: Positive for joint pain.  Neurological: Negative for tingling.  All other systems reviewed and are negative.        Past Surgical History:  Procedure Laterality Date  . ABDOMINAL HYSTERECTOMY      . CESAREAN SECTION        X 2  . COLONOSCOPY N/A 02/17/2015    Procedure: COLONOSCOPY;  Surgeon: Rogene Houston, MD;  Location: [facility];  Service: Endoscopy;  Laterality:  N/A;  930  . KNEE ARTHROSCOPY WITH MEDIAL MENISECTOMY Right 04/24/2017    Procedure: KNEE ARTHROSCOPY WITH PARTIAL MEDIAL MENISECTOMY; ACL DEBRIEDMENT;  Surgeon: Carole Civil, MD;  Location: [facility];  Service: Orthopedics;  Laterality: Right;        BP (!) 141/86   Pulse 86   Ht 5\' 6"  (1.676 m)   Wt 143 lb (64.9 kg)   BMI 23.08 kg/m    Physical Exam  Constitutional: She is oriented to person, place, and time. She appears well-developed and well-nourished.  Neurological: She is alert and oriented to person, place, and time.  Psychiatric: She has a normal mood and affect. Judgment normal.  Vitals reviewed.   Gait is associated with a limp even in a brace   Left knee inspection reveals no tenderness or swelling and full range of motion the knee is stable when all ligaments are tested strength and muscle tone are normal skin is intact sensation is normal her mental status shows normal mood and affect and she is oriented x3   Right knee shows a large joint effusion tenderness over the lateral joint line her range of motion is limited to 50 degrees of flexion she does not come to full extension she has a positive Lockman grade 2 could  not do a pivot collateral ligaments were stable muscle tone was normal there was no tremor skin was warm dry and intact sensation was normal she had a good distal pulse       Encounter Diagnoses  Name Primary?  . Knee injury, right, initial encounter    . Rupture of anterior cruciate ligament of right knee, initial encounter Yes    MRI showed a positive ACL tear as we knew with cartilage tears medial and lateral as well as arthritis  However, the patient is very active and does not wish to continue with the knee giving way so we have set her up for an ACL reconstruction with allograft right knee

## 2017-12-25 ENCOUNTER — Telehealth: Payer: Self-pay | Admitting: Radiology

## 2017-12-25 ENCOUNTER — Ambulatory Visit (HOSPITAL_COMMUNITY)
Admission: RE | Admit: 2017-12-25 | Discharge: 2017-12-25 | Disposition: A | Discharge status: Home / Self Care | Payer: 59 | Source: Ambulatory Visit | Attending: Orthopedic Surgery | Admitting: Orthopedic Surgery

## 2017-12-25 ENCOUNTER — Encounter (HOSPITAL_COMMUNITY): Payer: Self-pay | Admitting: *Deleted

## 2017-12-25 ENCOUNTER — Ambulatory Visit (HOSPITAL_COMMUNITY): Payer: 59 | Admitting: Anesthesiology

## 2017-12-25 ENCOUNTER — Encounter (HOSPITAL_COMMUNITY)
Admission: RE | Disposition: A | Discharge status: Home / Self Care | Payer: Self-pay | Source: Ambulatory Visit | Attending: Orthopedic Surgery

## 2017-12-25 ENCOUNTER — Other Ambulatory Visit: Payer: Self-pay | Admitting: Orthopedic Surgery

## 2017-12-25 DIAGNOSIS — M2241 Chondromalacia patellae, right knee: Secondary | ICD-10-CM | POA: Diagnosis not present

## 2017-12-25 DIAGNOSIS — Z9049 Acquired absence of other specified parts of digestive tract: Secondary | ICD-10-CM | POA: Diagnosis not present

## 2017-12-25 DIAGNOSIS — Z87891 Personal history of nicotine dependence: Secondary | ICD-10-CM | POA: Insufficient documentation

## 2017-12-25 DIAGNOSIS — S83241A Other tear of medial meniscus, current injury, right knee, initial encounter: Secondary | ICD-10-CM | POA: Diagnosis not present

## 2017-12-25 DIAGNOSIS — S83511A Sprain of anterior cruciate ligament of right knee, initial encounter: Secondary | ICD-10-CM | POA: Insufficient documentation

## 2017-12-25 DIAGNOSIS — Z808 Family history of malignant neoplasm of other organs or systems: Secondary | ICD-10-CM | POA: Diagnosis not present

## 2017-12-25 DIAGNOSIS — S83281A Other tear of lateral meniscus, current injury, right knee, initial encounter: Secondary | ICD-10-CM | POA: Insufficient documentation

## 2017-12-25 DIAGNOSIS — S83511D Sprain of anterior cruciate ligament of right knee, subsequent encounter: Secondary | ICD-10-CM | POA: Diagnosis not present

## 2017-12-25 DIAGNOSIS — X509XXA Other and unspecified overexertion or strenuous movements or postures, initial encounter: Secondary | ICD-10-CM | POA: Diagnosis not present

## 2017-12-25 HISTORY — PX: ANTERIOR CRUCIATE LIGAMENT REPAIR: SHX115

## 2017-12-25 SURGERY — REPAIR, KNEE, ACL
Anesthesia: Regional | Site: Knee | Laterality: Right

## 2017-12-25 MED ORDER — ONDANSETRON HCL 4 MG/2ML IJ SOLN
INTRAMUSCULAR | Status: DC | PRN
  Administered 2017-12-25 (×2): 4 mg via INTRAVENOUS

## 2017-12-25 MED ORDER — MIDAZOLAM HCL 2 MG/2ML IJ SOLN
INTRAMUSCULAR | Status: AC
  Filled 2017-12-25: qty 2

## 2017-12-25 MED ORDER — ARTIFICIAL TEARS OPHTHALMIC OINT
TOPICAL_OINTMENT | OPHTHALMIC | Status: DC | PRN
  Administered 2017-12-25: 1 via OPHTHALMIC

## 2017-12-25 MED ORDER — LACTATED RINGERS IV SOLN
INTRAVENOUS | Status: DC
  Administered 2017-12-25: 09:00:00 via INTRAVENOUS
  Administered 2017-12-25: 1000 mL via INTRAVENOUS

## 2017-12-25 MED ORDER — KETOROLAC TROMETHAMINE 30 MG/ML IJ SOLN
30.0000 mg | Freq: Once | INTRAMUSCULAR | Status: AC
  Administered 2017-12-25: 30 mg via INTRAVENOUS

## 2017-12-25 MED ORDER — KETOROLAC TROMETHAMINE 30 MG/ML IJ SOLN
INTRAMUSCULAR | Status: AC
  Filled 2017-12-25: qty 1

## 2017-12-25 MED ORDER — ROCURONIUM BROMIDE 50 MG/5ML IV SOLN
INTRAVENOUS | Status: AC
  Filled 2017-12-25: qty 1

## 2017-12-25 MED ORDER — PROPOFOL 10 MG/ML IV BOLUS
INTRAVENOUS | Status: DC | PRN
  Administered 2017-12-25: 120 mg via INTRAVENOUS

## 2017-12-25 MED ORDER — SODIUM CHLORIDE 0.9% FLUSH
INTRAVENOUS | Status: AC
  Filled 2017-12-25: qty 10

## 2017-12-25 MED ORDER — ACETAMINOPHEN 500 MG PO TABS
500.0000 mg | ORAL_TABLET | Freq: Once | ORAL | Status: AC
  Administered 2017-12-25: 500 mg via ORAL

## 2017-12-25 MED ORDER — SCOPOLAMINE 1 MG/3DAYS TD PT72
MEDICATED_PATCH | TRANSDERMAL | Status: AC
  Filled 2017-12-25: qty 1

## 2017-12-25 MED ORDER — ACETAMINOPHEN 500 MG PO TABS
ORAL_TABLET | ORAL | Status: AC
  Filled 2017-12-25: qty 1

## 2017-12-25 MED ORDER — SUCCINYLCHOLINE CHLORIDE 20 MG/ML IJ SOLN
INTRAMUSCULAR | Status: AC
  Filled 2017-12-25: qty 1

## 2017-12-25 MED ORDER — PROMETHAZINE HCL 25 MG/ML IJ SOLN
6.2500 mg | INTRAMUSCULAR | Status: DC | PRN
  Administered 2017-12-25: 6.25 mg via INTRAVENOUS
  Filled 2017-12-25: qty 1

## 2017-12-25 MED ORDER — SODIUM CHLORIDE 0.9 % IR SOLN
Status: DC | PRN
  Administered 2017-12-25 (×10): 3000 mL

## 2017-12-25 MED ORDER — LIDOCAINE HCL (PF) 1 % IJ SOLN
INTRAMUSCULAR | Status: AC
  Filled 2017-12-25: qty 5

## 2017-12-25 MED ORDER — HYDROCODONE-ACETAMINOPHEN 7.5-325 MG PO TABS: 1.0000 | ORAL_TABLET | ORAL | 0 refills | Status: DC | PRN

## 2017-12-25 MED ORDER — OXYCODONE-ACETAMINOPHEN 5-325 MG PO TABS: 1.0000 | ORAL_TABLET | ORAL | 0 refills | Status: DC | PRN

## 2017-12-25 MED ORDER — ROPIVACAINE HCL 5 MG/ML IJ SOLN
INTRAMUSCULAR | Status: AC
  Filled 2017-12-25: qty 30

## 2017-12-25 MED ORDER — ROPIVACAINE HCL 5 MG/ML IJ SOLN
INTRAMUSCULAR | Status: DC | PRN
  Administered 2017-12-25: 28 mL via PERINEURAL

## 2017-12-25 MED ORDER — ARTIFICIAL TEARS OPHTHALMIC OINT
TOPICAL_OINTMENT | OPHTHALMIC | Status: AC
  Filled 2017-12-25: qty 3.5

## 2017-12-25 MED ORDER — MIDAZOLAM HCL 2 MG/2ML IJ SOLN: 0.5000 mg | Freq: Once | INTRAMUSCULAR | Status: DC | PRN

## 2017-12-25 MED ORDER — HYDROCODONE-ACETAMINOPHEN 7.5-325 MG PO TABS
ORAL_TABLET | ORAL | Status: AC
  Filled 2017-12-25: qty 1

## 2017-12-25 MED ORDER — DEXAMETHASONE SODIUM PHOSPHATE 4 MG/ML IJ SOLN
INTRAMUSCULAR | Status: DC | PRN
  Administered 2017-12-25: 4 mg via INTRAVENOUS

## 2017-12-25 MED ORDER — MIDAZOLAM HCL 5 MG/5ML IJ SOLN
INTRAMUSCULAR | Status: DC | PRN
  Administered 2017-12-25: 2 mg via INTRAVENOUS

## 2017-12-25 MED ORDER — HYDROMORPHONE HCL 1 MG/ML IJ SOLN
0.2500 mg | INTRAMUSCULAR | Status: DC | PRN
  Administered 2017-12-25 (×5): 0.5 mg via INTRAVENOUS
  Filled 2017-12-25 (×6): qty 0.5

## 2017-12-25 MED ORDER — CHLORHEXIDINE GLUCONATE 4 % EX LIQD: 60.0000 mL | Freq: Once | CUTANEOUS | Status: DC

## 2017-12-25 MED ORDER — LIDOCAINE HCL (CARDIAC) PF 50 MG/5ML IV SOSY
PREFILLED_SYRINGE | INTRAVENOUS | Status: DC | PRN
  Administered 2017-12-25: 30 mg via INTRAVENOUS

## 2017-12-25 MED ORDER — BUPIVACAINE-EPINEPHRINE 0.5% -1:200000 IJ SOLN
INTRAMUSCULAR | Status: DC | PRN
  Administered 2017-12-25: 30 mL

## 2017-12-25 MED ORDER — BUPIVACAINE-EPINEPHRINE (PF) 0.5% -1:200000 IJ SOLN
INTRAMUSCULAR | Status: AC
  Filled 2017-12-25: qty 60

## 2017-12-25 MED ORDER — FENTANYL CITRATE (PF) 100 MCG/2ML IJ SOLN
INTRAMUSCULAR | Status: DC | PRN
  Administered 2017-12-25 (×5): 25 ug via INTRAVENOUS
  Administered 2017-12-25 (×2): 50 ug via INTRAVENOUS
  Administered 2017-12-25: 25 ug via INTRAVENOUS

## 2017-12-25 MED ORDER — PROPOFOL 10 MG/ML IV BOLUS
INTRAVENOUS | Status: AC
  Filled 2017-12-25: qty 40

## 2017-12-25 MED ORDER — SCOPOLAMINE 1 MG/3DAYS TD PT72
MEDICATED_PATCH | TRANSDERMAL | Status: DC | PRN
  Administered 2017-12-25: 1 via TRANSDERMAL

## 2017-12-25 MED ORDER — 0.9 % SODIUM CHLORIDE (POUR BTL) OPTIME
TOPICAL | Status: DC | PRN
  Administered 2017-12-25 (×3): 1000 mL

## 2017-12-25 MED ORDER — EPINEPHRINE PF 1 MG/ML IJ SOLN
INTRAMUSCULAR | Status: AC
  Filled 2017-12-25: qty 10

## 2017-12-25 MED ORDER — EPINEPHRINE PF 1 MG/ML IJ SOLN
INTRAMUSCULAR | Status: AC
  Filled 2017-12-25: qty 8

## 2017-12-25 MED ORDER — FENTANYL CITRATE (PF) 250 MCG/5ML IJ SOLN
INTRAMUSCULAR | Status: AC
  Filled 2017-12-25: qty 5

## 2017-12-25 MED ORDER — CEFAZOLIN SODIUM-DEXTROSE 2-4 GM/100ML-% IV SOLN
2.0000 g | INTRAVENOUS | Status: AC
  Administered 2017-12-25: 2 g via INTRAVENOUS
  Filled 2017-12-25: qty 100

## 2017-12-25 MED ORDER — HYDROCODONE-ACETAMINOPHEN 7.5-325 MG PO TABS
1.0000 | ORAL_TABLET | Freq: Once | ORAL | Status: AC
  Administered 2017-12-25: 1 via ORAL

## 2017-12-25 MED ORDER — HYDROCODONE-ACETAMINOPHEN 7.5-325 MG PO TABS: 1.0000 | ORAL_TABLET | Freq: Once | ORAL | Status: DC | PRN

## 2017-12-25 MED ORDER — ONDANSETRON HCL 4 MG/2ML IJ SOLN
INTRAMUSCULAR | Status: AC
  Filled 2017-12-25: qty 2

## 2017-12-25 SURGICAL SUPPLY — 86 items
APL SKNCLS STERI-STRIP NONHPOA (GAUZE/BANDAGES/DRESSINGS) ×1
BANDAGE ELASTIC 4 LF NS (GAUZE/BANDAGES/DRESSINGS) ×1 IMPLANT
BANDAGE ELASTIC 6 LF NS (GAUZE/BANDAGES/DRESSINGS) ×1 IMPLANT
BANDAGE ELASTIC 6 VELCRO NS (GAUZE/BANDAGES/DRESSINGS) IMPLANT
BANDAGE ESMARK 6X9 LF (GAUZE/BANDAGES/DRESSINGS) ×1 IMPLANT
BENZOIN TINCTURE PRP APPL 2/3 (GAUZE/BANDAGES/DRESSINGS) ×1 IMPLANT
BIT DRILL 2.4X128 (BIT) ×2 IMPLANT
BLADE AGGRESSIVE PLUS 4.0 (BLADE) ×2 IMPLANT
BLADE OSC/SAGITTAL MD 9X18.5 (BLADE) ×2 IMPLANT
BLADE SURG SZ10 CARB STEEL (BLADE) ×4 IMPLANT
BLADE SURG SZ11 CARB STEEL (BLADE) ×2 IMPLANT
BNDG CMPR 9X6 STRL LF SNTH (GAUZE/BANDAGES/DRESSINGS) ×1
BNDG CMPR MED 5X4 ELC HKLP NS (GAUZE/BANDAGES/DRESSINGS) ×1
BNDG CMPR MED 5X6 ELC HKLP NS (GAUZE/BANDAGES/DRESSINGS) ×1
BNDG ESMARK 6X9 LF (GAUZE/BANDAGES/DRESSINGS) ×2
BRACE T-SCOPE KNEE POSTOP (MISCELLANEOUS) ×2 IMPLANT
BUR AGGRESSIVE PLUS 5.0 (BURR) ×2 IMPLANT
BUR FAST CUTTING (BURR)
BUR SRGRND 54.5X2.4X8 (BURR) IMPLANT
BURR SRGRND 54.5X2.4X8 (BURR)
CHLORAPREP W/TINT 26ML (MISCELLANEOUS) ×4 IMPLANT
CLOTH BEACON ORANGE TIMEOUT ST (SAFETY) ×2 IMPLANT
COVER LIGHT HANDLE STERIS (MISCELLANEOUS) ×4 IMPLANT
CUFF TOURNIQUET SINGLE 34IN LL (TOURNIQUET CUFF) ×2 IMPLANT
CUTTER TOMCAT 5.0MM (BURR) ×1 IMPLANT
DECANTER SPIKE VIAL GLASS SM (MISCELLANEOUS) ×3 IMPLANT
ELECT REM PT RETURN 9FT ADLT (ELECTROSURGICAL) ×2
ELECTRODE REM PT RTRN 9FT ADLT (ELECTROSURGICAL) ×1 IMPLANT
FLOOR PAD 36X40 (MISCELLANEOUS) ×2
GAUZE 4X4 16PLY RFD (DISPOSABLE) ×2 IMPLANT
GAUZE SPONGE 4X4 12PLY STRL (GAUZE/BANDAGES/DRESSINGS) ×1 IMPLANT
GAUZE XEROFORM 5X9 LF (GAUZE/BANDAGES/DRESSINGS) ×2 IMPLANT
GLOVE BIOGEL PI IND STRL 7.0 (GLOVE) ×1 IMPLANT
GLOVE BIOGEL PI INDICATOR 7.0 (GLOVE) ×3
GLOVE SKINSENSE NS SZ8.0 LF (GLOVE) ×2
GLOVE SKINSENSE STRL SZ8.0 LF (GLOVE) ×1 IMPLANT
GLOVE SS N UNI LF 8.5 STRL (GLOVE) ×3 IMPLANT
GOWN STRL REUS W/TWL LRG LVL3 (GOWN DISPOSABLE) ×8 IMPLANT
GOWN STRL REUS W/TWL XL LVL3 (GOWN DISPOSABLE) ×2 IMPLANT
GRAFT ACHILLES CALC BNE BLCK (Bone Implant) IMPLANT
GRAFT ACHILLES TENDON (Bone Implant) ×2 IMPLANT
HLDR LEG FOAM (MISCELLANEOUS) ×1 IMPLANT
INST SET MINOR BONE (KITS) ×2 IMPLANT
IV NS IRRIG 3000ML ARTHROMATIC (IV SOLUTION) ×17 IMPLANT
KIT BLADEGUARD II DBL (SET/KITS/TRAYS/PACK) ×2 IMPLANT
KIT TRANSTIBIAL (DISPOSABLE) ×2 IMPLANT
KIT TURNOVER CYSTO (KITS) ×2 IMPLANT
LEG HOLDER FOAM (MISCELLANEOUS) ×1
MANIFOLD NEPTUNE II (INSTRUMENTS) ×2 IMPLANT
MARKER SKIN DUAL TIP RULER LAB (MISCELLANEOUS) ×2 IMPLANT
NDL HYPO 21X1.5 SAFETY (NEEDLE) ×1 IMPLANT
NDL SPNL 18GX3.5 QUINCKE PK (NEEDLE) ×1 IMPLANT
NEEDLE HYPO 21X1.5 SAFETY (NEEDLE) ×2 IMPLANT
NEEDLE SPNL 18GX3.5 QUINCKE PK (NEEDLE) ×2 IMPLANT
NS IRRIG 1000ML POUR BTL (IV SOLUTION) ×4 IMPLANT
PACK ARTHRO LIMB DRAPE STRL (MISCELLANEOUS) ×2 IMPLANT
PACK BASIC III (CUSTOM PROCEDURE TRAY) ×2
PACK SRG BSC III STRL LF ECLPS (CUSTOM PROCEDURE TRAY) ×1 IMPLANT
PAD ABD 5X9 TENDERSORB (GAUZE/BANDAGES/DRESSINGS) ×1 IMPLANT
PAD ARMBOARD 7.5X6 YLW CONV (MISCELLANEOUS) ×2 IMPLANT
PAD FLOOR 36X40 (MISCELLANEOUS) ×1 IMPLANT
PADDING CAST COTTON 6X4 STRL (CAST SUPPLIES) ×1 IMPLANT
PADDING WEBRIL 6 STERILE (GAUZE/BANDAGES/DRESSINGS) ×1 IMPLANT
PENCIL HANDSWITCHING (ELECTRODE) ×2 IMPLANT
REAMER LOW PROFILE 10MM (INSTRUMENTS) ×1 IMPLANT
SCREW BIO INTER 8X23 (Screw) ×1 IMPLANT
SCREW BIOCOMP 9X28 (Screw) ×1 IMPLANT
SET ARTHROSCOPY INST (INSTRUMENTS) ×2 IMPLANT
SET BASIN LINEN APH (SET/KITS/TRAYS/PACK) ×2 IMPLANT
SPONGE LAP 18X18 X RAY DECT (DISPOSABLE) ×2 IMPLANT
STAPLE FIXATION W/SPIKE  SMALL (Staple) ×1 IMPLANT
STAPLE FIXATION W/SPIKE SMALL (Staple) IMPLANT
STAPLER VISISTAT 35W (STAPLE) ×1 IMPLANT
STRIP CLOSURE SKIN 1/2X4 (GAUZE/BANDAGES/DRESSINGS) ×2 IMPLANT
SUT ETHIBOND 5 LR DA (SUTURE) ×4 IMPLANT
SUT MON AB 0 CT1 (SUTURE) ×2 IMPLANT
SUT MON AB 2-0 CT1 36 (SUTURE) ×2 IMPLANT
SUT VIC AB 1 CT1 27 (SUTURE) ×2
SUT VIC AB 1 CT1 27XBRD ANTBC (SUTURE) ×1 IMPLANT
SYR 30ML LL (SYRINGE) ×2 IMPLANT
SYR BULB IRRIGATION 50ML (SYRINGE) ×4 IMPLANT
TOWEL OR 17X26 4PK STRL BLUE (TOWEL DISPOSABLE) ×2 IMPLANT
TUBING ARTHROSCOPY INFLOW/OUT (IRRIGATION / IRRIGATOR) ×2 IMPLANT
WAND 90 DEG TURBOVAC W/CORD (SURGICAL WAND) ×1 IMPLANT
YANKAUER SUCT 12FT TUBE ARGYLE (SUCTIONS) ×2 IMPLANT
YANKAUER SUCT BULB TIP 10FT TU (MISCELLANEOUS) ×6 IMPLANT

## 2017-12-25 NOTE — Anesthesia Procedure Notes (Addendum)
Anesthesia Regional Block: Adductor canal block   Pre-Anesthetic Checklist: ,, timeout performed, Correct Patient, Correct Site, Correct Laterality, Correct Procedure, Correct Position, site marked, Risks and benefits discussed,  Surgical consent,  Pre-op evaluation,  At surgeon's request and post-op pain management  Laterality: Right  Prep: chloraprep       Needles:   Needle Type: Stimulator Needle - 80     Needle Length: 10cm  Needle Gauge: 20     Additional Needles:   Procedures:,,,, ultrasound used (permanent image in chart),,,,  Narrative:  Start time: 12/25/2017 7:36 AM End time: 12/25/2017 7:42 AM Injection made incrementally with aspirations every 5 mL.  Events: blood aspirated,,,,,,,,,,  Performed by: With CRNAs  Anesthesiologist: Lenice Llamas, MD CRNA: Mickel Baas, CRNA  Additional Notes: + consent after Q&A  Easy block , good visualization with good spread - no photo taken  3 -5cc incremental injections , NO POI, tolerated well, awake conversant throughout  28cc total of 0.5% Naropin plain, No adjuvants used GA administered after Saphenous N. Block

## 2017-12-25 NOTE — Anesthesia Preprocedure Evaluation (Signed)
Anesthesia Evaluation  Patient identified by MRN, date of birth, ID band Patient awake    Reviewed: Allergy & Precautions, NPO status , Patient's Chart, lab work & pertinent test results  History of Anesthesia Complications (+) PONV  Airway Mallampati: I  TM Distance: >3 FB Neck ROM: Full    Dental no notable dental hx. (+) Teeth Intact   Pulmonary neg pulmonary ROS, former smoker,    Pulmonary exam normal breath sounds clear to auscultation       Cardiovascular Exercise Tolerance: Good negative cardio ROS Normal cardiovascular examI Rhythm:Regular Rate:Normal     Neuro/Psych negative neurological ROS  negative psych ROS   GI/Hepatic negative GI ROS, Neg liver ROS,   Endo/Other  negative endocrine ROS  Renal/GU negative Renal ROS  negative genitourinary   Musculoskeletal negative musculoskeletal ROS (+)   Abdominal   Peds negative pediatric ROS (+)  Hematology negative hematology ROS (+)   Anesthesia Other Findings   Reproductive/Obstetrics negative OB ROS                             Anesthesia Physical Anesthesia Plan  ASA: II  Anesthesia Plan: General and Regional   Post-op Pain Management:    Induction:   PONV Risk Score and Plan:   Airway Management Planned: LMA  Additional Equipment:   Intra-op Plan:   Post-operative Plan: Extubation in OR  Informed Consent:   Dental advisory given  Plan Discussed with:   Anesthesia Plan Comments:         Anesthesia Quick Evaluation

## 2017-12-25 NOTE — Telephone Encounter (Signed)
[facility] called Hydrocodone or Oxycodone  I called to see what we can do, since pharmacy has already filled the Hydrocodone and Oxycodone is there to be filled.   I have advised to give the Oxycodone, even though husband just picked up the Hydrocodone    Please, if you send in 2 prescriptions, and one needs to be cancelled, let me know and I can cancel one of them for you. Once it goes, you have to call pharmacy to cancel.

## 2017-12-25 NOTE — Brief Op Note (Signed)
12/25/2017  10:00 AM  PATIENT:  Natalie Hess  57 y.o. female  PRE-OPERATIVE DIAGNOSIS:  torn ACL, torn medial meniscus, and torn lateral meniscus Right knee  POST-OPERATIVE DIAGNOSIS:  torn ACL, torn medial meniscus, and torn lateral meniscus Right knee  PROCEDURE:  Procedure(s): KNEE ARTHROSCOPY WITH ANTERIOR CRUCIATE LIGAMENT (ACL) REPAIR with Allograft Achilles,  medial and lateral meniscectomy (Right)  [phone]  Operative findings complete rupture of ACL Small posterior horn medial meniscal tear mild cartilage articular surface grade I chondromalacia medial femoral condyle small lesions 5 mm less  PCL intact  Mild chondromalacia central ridge patella trochlea normal  Small posterior horn lateral meniscal tear lateral cartilage normal  Assisted by Simonne Maffucci  SURGEON:  Surgeon(s) and Role:    Carole Civil, MD - Primary  General LMA anesthesia with additional block by anesthesia  Total of 30 cc Marcaine with epinephrine at the end of the procedure  No specimens  Counts were correct  No Tourniquet  DICTATION: .Dragon Dictation  PLAN OF CARE: Discharge to home after PACU  PATIENT DISPOSITION:  PACU - hemodynamically stable.   Delay start of Pharmacological VTE agent (>24hrs) due to surgical blood loss or risk of bleeding: not applicable

## 2017-12-25 NOTE — Interval H&P Note (Signed)
History and Physical Interval Note:  12/25/2017 7:24 AM  Natalie Hess  has presented today for surgery, with the diagnosis of torn ACL, torn medial meniscus, and torn lateral meniscus Right knee  The various methods of treatment have been discussed with the patient and family. After consideration of risks, benefits and other options for treatment, the patient has consented to  Procedure(s): KNEE ARTHROSCOPY WITH ANTERIOR CRUCIATE LIGAMENT (ACL) REPAIR with Allograft Achilles,  medial and lateral meniscectomy (Right) as a surgical intervention .  The patient's history has been reviewed, patient examined, no change in status, stable for surgery.  I have reviewed the patient's chart and labs.  Questions were answered to the patient's satisfaction.     Arther Abbott

## 2017-12-25 NOTE — Discharge Instructions (Signed)
You had a small tear in your medial meniscus we removed a portion less than 10%  You also had a small tear in the lateral meniscus we removed this portion less than 10%  You had mild chondromalacia of the patella which was left alone  The ACL was completely ruptured and a new Achilles tendon allograft was placed        General Anesthesia, Adult, Care After These instructions provide you with information about caring for yourself after your procedure. Your health care provider may also give you more specific instructions. Your treatment has been planned according to current medical practices, but problems sometimes occur. Call your health care provider if you have any problems or questions after your procedure. What can I expect after the procedure? After the procedure, it is common to have:  Vomiting.  A sore throat.  Mental slowness.  It is common to feel:  Nauseous.  Cold or shivery.  Sleepy.  Tired.  Sore or achy, even in parts of your body where you did not have surgery.  Follow these instructions at home: For at least 24 hours after the procedure:  Do not: ? Participate in activities where you could fall or become injured. ? Drive. ? Use heavy machinery. ? Drink alcohol. ? Take sleeping pills or medicines that cause drowsiness. ? Make important decisions or sign legal documents. ? Take care of children on your own.  Rest. Eating and drinking  If you vomit, drink water, juice, or soup when you can drink without vomiting.  Drink enough fluid to keep your urine clear or pale yellow.  Make sure you have little or no nausea before eating solid foods.  Follow the diet recommended by your health care provider. General instructions  Have a responsible adult stay with you until you are awake and alert.  Return to your normal activities as told by your health care provider. Ask your health care provider what activities are safe for you.  Take  over-the-counter and prescription medicines only as told by your health care provider.  If you smoke, do not smoke without supervision.  Keep all follow-up visits as told by your health care provider. This is important. Contact a health care provider if:  You continue to have nausea or vomiting at home, and medicines are not helpful.  You cannot drink fluids or start eating again.  You cannot urinate after 8-12 hours.  You develop a skin rash.  You have fever.  You have increasing redness at the site of your procedure. Get help right away if:  You have difficulty breathing.  You have chest pain.  You have unexpected bleeding.  You feel that you are having a life-threatening or urgent problem. This information is not intended to replace advice given to you by your health care provider. Make sure you discuss any questions you have with your health care provider. Document Released: 06/10/2000 Document Revised: 08/07/2015 Document Reviewed: 02/16/2015 Elsevier Interactive Patient Education  [employer].

## 2017-12-25 NOTE — Transfer of Care (Signed)
Immediate Anesthesia Transfer of Care Note  Patient: Natalie Hess  Procedure(s) Performed: KNEE ARTHROSCOPY WITH ANTERIOR CRUCIATE LIGAMENT (ACL) REPAIR with Allograft Achilles,  medial and lateral meniscectomy (Right Knee)  Patient Location: PACU  Anesthesia Type:General  Level of Consciousness: awake, oriented and patient cooperative  Airway & Oxygen Therapy: Patient Spontanous Breathing  Post-op Assessment: Report given to RN and Post -op Vital signs reviewed and stable  Post vital signs: Reviewed and stable  Last Vitals:  Vitals Value Taken Time  BP 139/77 12/25/2017 10:01 AM  Temp    Pulse 74 12/25/2017 10:08 AM  Resp 8 12/25/2017 10:08 AM  SpO2 100 % 12/25/2017 10:08 AM  Vitals shown include unvalidated device data.  Last Pain:  Vitals:   12/25/17 0635  TempSrc: Oral  PainSc: 3       Patients Stated Pain Goal: 7 (24/49/75 3005)  Complications: No apparent anesthesia complications

## 2017-12-25 NOTE — Anesthesia Postprocedure Evaluation (Signed)
Anesthesia Post Note Late Entry for 1045  Patient: Natalie Hess  Procedure(s) Performed: KNEE ARTHROSCOPY WITH ANTERIOR CRUCIATE LIGAMENT (ACL) REPAIR with Allograft Achilles,  medial and lateral meniscectomy (Right Knee)  Patient location during evaluation: PACU Anesthesia Type: Regional and General Level of consciousness: awake and alert Pain management: pain level controlled Vital Signs Assessment: post-procedure vital signs reviewed and stable Respiratory status: spontaneous breathing Cardiovascular status: stable Postop Assessment: no apparent nausea or vomiting Anesthetic complications: no     Last Vitals:  Vitals:   12/25/17 1130 12/25/17 1142  BP: (!) 154/71 (!) 153/74  Pulse: 74 63  Resp: (!) 21   Temp:  (!) 36.4 C  SpO2: 93% 98%    Last Pain:  Vitals:   12/25/17 1142  TempSrc: Oral  PainSc: 5                  [name] A

## 2017-12-25 NOTE — Op Note (Signed)
12/25/2017  10:00 AM  PATIENT:  Natalie Hess  57 y.o. female  PRE-OPERATIVE DIAGNOSIS:  torn ACL, torn medial meniscus, and torn lateral meniscus Right knee  POST-OPERATIVE DIAGNOSIS:  torn ACL, torn medial meniscus, and torn lateral meniscus Right knee  PROCEDURE:  Procedure(s): KNEE ARTHROSCOPY WITH ANTERIOR CRUCIATE LIGAMENT (ACL) REPAIR with Allograft Achilles,  medial and lateral meniscectomy (Right)  [id number] [phone]  Operative findings complete rupture of ACL Small posterior horn medial meniscal tear mild cartilage articular surface grade I chondromalacia medial femoral condyle small lesions 5 mm less  PCL intact  Mild chondromalacia central ridge patella trochlea normal  Small posterior horn lateral meniscal tear lateral cartilage normal  Assisted by Simonne Maffucci  SURGEON:  Surgeon(s) and Role:    Carole Civil, MD - Primary  General LMA anesthesia with additional block by anesthesia  Total of 30 cc Marcaine with epinephrine at the end of the procedure  No specimens  Counts were correct  No Tourniquet  DICTATION: .Dragon Dictation  The patient was identified in preop surgical site confirmed right knee chart review completed right knee marked for surgery.  Patient taken to surgery given Ancef 2 g.  The allograft was prepared on the back table was an Achilles tendon allograft.  It was soaked in warm saline.  It was contoured to a 10 mm x 20 mm bone block and then tubularized with #1 Vicryl and placed on a stretching board  Exam under anesthesia grade 2 Lockman grade 1 pivot  Sterile prep and drape was performed timeout was completed  Standard diagnostic arthroscopy was performed through medial and lateral portals  Small posterior horn medial meniscus was resected to a stable rim with a straight biter and shaver.  Small posterior horn root tear lateral meniscus was treated with shaver and 90 degree Linvatec wand.  Stable rim confirmed.  Remnant of ACL  tissue was removed  Over-the-top position was identified.  ACL footprint left intact.  An accessory medial portal was established with a spinal needle.  Scope was placed in the medial portal to view the ACL footprint.  A guidepin was placed in the footprint between the anteromedial and posterior lateral bundle.  This was adjusted as needed to get the pain in the center of the footprint.  10 mm reamer rigid was passed and a 10 mm x 25 mm tunnel was created  A passing suture was placed and a hemostat was used to hold it in place outside the joint  The tibial footprint was identified and a small incision was made near the [facility] tendons taken down to bone subperiosteal elevation was performed,  the guidepin was placed through the tibial aimer set at 55 degrees.  The guidepin was placed in the footprint and overreamed with an 11 mm reamer.  The tunnel was prepared with rasp and shaver  The previously passed suture was passed through the tibial tunnel and the graft was passed and secured with a femoral screw 8 x 23 using the notcher, trailblazer guide pin and screw.  The knee was placed through several cycles and then placed in extension and a second screw also 8 x 23 was placed and a small Richard staple.  The knee was checked for [facility] test and visually with a probe to confirm stability as well as confirming full extension without impingement  The joint was irrigated the distal portion of the graft was flipped over the staple to provide soft tissue coverage.  0 Monocryl and  2-0 Monocryl in running fashion along with Steri-Strips and benzoin was placed and the portals were closed with 3-0 nylon.  30 cc of Marcaine was injected into the joint.    PLAN OF CARE: Discharge to home after PACU  PATIENT DISPOSITION:  PACU - hemodynamically stable.   Delay start of Pharmacological VTE agent (>24hrs) due to surgical blood loss or risk of bleeding: not applicable

## 2017-12-25 NOTE — Anesthesia Procedure Notes (Signed)
Procedure Name: LMA Insertion Date/Time: 12/25/2017 7:46 AM Performed by: Andree Elk, Kolbee Stallman A, CRNA Pre-anesthesia Checklist: Patient identified, Timeout performed, Emergency Drugs available, Suction available and Patient being monitored Patient Re-evaluated:Patient Re-evaluated prior to induction Oxygen Delivery Method: Circle system utilized Preoxygenation: Pre-oxygenation with 100% oxygen Induction Type: IV induction Ventilation: Mask ventilation without difficulty LMA: LMA inserted LMA Size: 3.0 Number of attempts: 1 Placement Confirmation: positive ETCO2 and breath sounds checked- equal and bilateral Tube secured with: Tape Dental Injury: Teeth and Oropharynx as per pre-operative assessment

## 2017-12-26 ENCOUNTER — Telehealth: Payer: Self-pay | Admitting: Orthopedic Surgery

## 2017-12-26 NOTE — Telephone Encounter (Signed)
Will do however, after the discharge orders were written, PACU nurse called me while in surgery to change the prescription.  So then I had to finish the surgery and change the prescription after the patient had already left so could not do it the way u are describing

## 2017-12-26 NOTE — Telephone Encounter (Signed)
Natalie Hess called wanting to ask and be sure if she needs to change her dressing before she comes in on Friday 10/18 and if she can start bending it or wait on that also.  Please call and advise  [phone]

## 2017-12-26 NOTE — Telephone Encounter (Signed)
Do not change dressing, and do not bend  Will see her on Friday for further instructions.   Can you call her for me? I am in clinic, cannot but will call her before I leave

## 2017-12-26 NOTE — Telephone Encounter (Signed)
I called her to discuss  Advised also about the Oxycodone

## 2017-12-26 NOTE — Telephone Encounter (Signed)
Ok. Thanks!

## 2017-12-29 ENCOUNTER — Encounter (HOSPITAL_COMMUNITY): Payer: Self-pay | Admitting: Orthopedic Surgery

## 2018-01-02 ENCOUNTER — Encounter: Payer: Self-pay | Admitting: Orthopedic Surgery

## 2018-01-02 ENCOUNTER — Ambulatory Visit (INDEPENDENT_AMBULATORY_CARE_PROVIDER_SITE_OTHER): Payer: 59 | Admitting: Orthopedic Surgery

## 2018-01-02 VITALS — BP 143/78 | HR 76 | Ht 66.0 in | Wt 143.0 lb

## 2018-01-02 DIAGNOSIS — Z9889 Other specified postprocedural states: Secondary | ICD-10-CM

## 2018-01-02 DIAGNOSIS — Z4889 Encounter for other specified surgical aftercare: Secondary | ICD-10-CM

## 2018-01-02 NOTE — Patient Instructions (Signed)
Start PT   Continue ice 6 x a day for 30 min each   Weight bearing as tolerated with brace and crutches   You can sleep out of the brace   You will be out of work for 6 more weeks

## 2018-01-02 NOTE — Progress Notes (Signed)
Chief Complaint  Patient presents with  . Knee Injury    DOS 12/25/17    Encounter Diagnoses  Name Primary?  . S/P ACL repair right 12/25/17   . Aftercare following surgery Yes   12/25/2017  10:00 AM  PATIENT:  Natalie Hess  57 y.o. female  PRE-OPERATIVE DIAGNOSIS:  torn ACL, torn medial meniscus, and torn lateral meniscus Right knee  POST-OPERATIVE DIAGNOSIS:  torn ACL, torn medial meniscus, and torn lateral meniscus Right knee  PROCEDURE:  Procedure(s): KNEE ARTHROSCOPY WITH ANTERIOR CRUCIATE LIGAMENT (ACL) REPAIR with Allograft Achilles,  medial and lateral meniscectomy (Right)  [phone]  Operative findings complete rupture of ACL Small posterior horn medial meniscal tear mild cartilage articular surface grade I chondromalacia medial femoral condyle small lesions 5 mm less  PCL intact  Mild chondromalacia central ridge patella trochlea normal  Small posterior horn lateral meniscal tear lateral cartilage normal  Assisted by Simonne Maffucci  SURGEON:  Surgeon(s) and Role:    Carole Civil, MD - Primary   Wound clean  [name] continues to improve, she is not requiring any pain medication  Knee extension good  Encounter Diagnoses  Name Primary?  . S/P ACL repair right 12/25/17   . Aftercare following surgery Yes    Fu 4 weeks

## 2018-01-06 ENCOUNTER — Encounter: Payer: Self-pay | Admitting: Orthopedic Surgery

## 2018-01-07 ENCOUNTER — Other Ambulatory Visit: Payer: Self-pay

## 2018-01-07 ENCOUNTER — Ambulatory Visit (HOSPITAL_COMMUNITY): Payer: 59 | Attending: Orthopedic Surgery

## 2018-01-07 ENCOUNTER — Encounter (HOSPITAL_COMMUNITY): Payer: Self-pay

## 2018-01-07 DIAGNOSIS — M6281 Muscle weakness (generalized): Secondary | ICD-10-CM | POA: Insufficient documentation

## 2018-01-07 DIAGNOSIS — M25661 Stiffness of right knee, not elsewhere classified: Secondary | ICD-10-CM | POA: Insufficient documentation

## 2018-01-07 DIAGNOSIS — R600 Localized edema: Secondary | ICD-10-CM | POA: Insufficient documentation

## 2018-01-07 NOTE — Patient Instructions (Signed)
Supine Heel Slides reps: 10-20 sets: 3 daily: 1 weekly: 7   Exercise image step 1   Exercise image step 2   Exercise image step 3 Setup  Begin lying on your back with your legs bent and your hands resting on your belly. Movement  Take a deep breath in, as you exhale, tighten your abdominal muscles. Continue to breathe normally as you hold the contraction and slowly slide one leg out straight. Slide back to start position, relax contraction and then repeat with the other leg. Tip  Remember to breathe normally through out this exercise as you keep your abdominals tight. Supine Quad Set reps: 10-20 sets: 3 hold: 5 seconds daily: 1  weekly: 7      Exercise image step 1   Exercise image step 2 Setup  Image: Med[employer] logo Login URL: [email or web address] Access Code: [email or web address]  Disclaimer: This program provides exercises related to your condition that you can perform at home. As there is a risk of injury with any activity, use caution when performing exercises. If you experience any pain or discomfort, discontinue the exercises and contact your health care provider. Date printed: 01/07/2018  Begin lying on your back with one knee bent and your other leg straight with your knee resting on a towel roll. Movement  Gently squeeze your thigh muscles, pushing the back of your knee down into the towel. Tip  Make sure to keep your back flat against the floor during the exercise. Standing Knee Flexion reps: 10-20 sets: 3 daily: 1 weekly: 7   Exercise image step 1   Exercise image step 2 Setup  Begin in a standing upright position in front of a counter or stable surface for support with your surgical leg slightly bent and your toes resting on the ground. Movement  Slowly bend your knee, lifting the foot of your surgical leg off the ground. Hold briefly, then return to the starting position and repeat. Tip  Make sure to maintain an upright posture and keep your movements slow and  controlled.

## 2018-01-07 NOTE — Therapy (Signed)
[place], [place], [address] Phone: [phone]   Fax:  [phone]  Physical Therapy Evaluation  Patient Details  Name: Natalie Hess MRN: [id number] Date of Birth: 1960/08/02 Referring Provider (PT): Arther Abbott   Encounter Date: 01/07/2018  PT End of Session - 01/07/18 1411    Visit Number  1    Number of Visits  17    Date for PT Re-Evaluation  03/04/18    Authorization Type  [place] [employer] (no visit limit, no auth required)    Authorization Time Period  01/07/2018 - 03/06/2018    Authorization - Visit Number  1    Authorization - Number of Visits  10    PT Start Time  6629    PT Stop Time  1344    PT Time Calculation (min)  39 min    Equipment Utilized During Treatment  Other (comment)   Bledso brace   Activity Tolerance  Patient tolerated treatment well    Behavior During Therapy  [facility] for tasks assessed/performed       Past Medical History:  Diagnosis Date  . Medical history non-contributory   . PONV (postoperative nausea and vomiting)    pt states the combination of zofran, decadron and scope patch worked well for her PONV history.    Past Surgical History:  Procedure Laterality Date  . ABDOMINAL HYSTERECTOMY    . ANTERIOR CRUCIATE LIGAMENT REPAIR Right 12/25/2017   Procedure: KNEE ARTHROSCOPY WITH ANTERIOR CRUCIATE LIGAMENT (ACL) REPAIR with Allograft Achilles,  medial and lateral meniscectomy;  Surgeon: Carole Civil, MD;  Location: [facility];  Service: Orthopedics;  Laterality: Right;  . CESAREAN SECTION     X 2  . COLONOSCOPY N/A 02/17/2015   Procedure: COLONOSCOPY;  Surgeon: Rogene Houston, MD;  Location: [facility];  Service: Endoscopy;  Laterality: N/A;  930  . KNEE ARTHROSCOPY WITH MEDIAL MENISECTOMY Right 04/24/2017   Procedure: KNEE ARTHROSCOPY WITH PARTIAL MEDIAL MENISECTOMY; ACL DEBRIEDMENT;  Surgeon: Carole Civil, MD;  Location: [facility];  Service: Orthopedics;   Laterality: Right;    There were no vitals filed for this visit.   Subjective Assessment - 01/07/18 1402    Subjective  Patient has had Rt knee pain going back to initial injury on 04/20/17 when she was jumping on a trampoline and hyperextended her knee. She underwent a Rt knee medial meniscectomy and partial ACL debridement on 04/24/17 but no full repair was performed on her ACL at that time. She then participated in 6 weeks of rehab and was discharged in March. On September 27th patient reports her Rt knee completely buckled and shifted to the side when she was stepping down her front step. Imaging revealed a complete rupture of the ACL and she had surgery on 12/25/17 with an achilles allograft. She had her follow up last Friday with Dr. Aline Brochure and he would like for her to continue using the crutches and wear the brace while mobilizing (locked at 0-90). She is also not allowed to drive at this time.    Limitations  Standing;Walking;House hold activities    Patient Stated Goals  to drive again and get back to work    Currently in Pain?  No/denies       [facility] PT Assessment - 01/07/18 0001      Assessment   Medical Diagnosis  Rt ACL Repair    Referring Provider (PT)  Arther Abbott    Onset Date/Surgical  Date  12/25/17    Next MD Visit  01/30/18    Prior Therapy  yes for Rt knee pain      Precautions   Precautions  Knee    Precaution Comments  ACL Repair      Restrictions   Weight Bearing Restrictions  No      Balance Screen   Has the patient fallen in the past 6 months  Yes    How many times?  2   Rt knee buckled   Has the patient had a decrease in activity level because of a fear of falling?   Yes    Is the patient reluctant to leave their home because of a fear of falling?   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, 8 hours/day and also on call    Leisure  Patient enjoyts hunting (uses deer stands), hiking and general outdoor activities      Cognition   Overall Cognitive Status  Within Functional Limits for tasks assessed      Observation/Other Assessments   Focus on Therapeutic Outcomes (FOTO)   51% limited      Observation/Other Assessments-Edema    Edema  Circumferential      Circumferential Edema   Circumferential - Right  15.75" at joint line    Circumferential - Left   15" at joint line      Functional Tests   Functional tests  Single leg stance      Single Leg Stance   Comments  Rt LE = 20 sec; Lt LE = 30 sec      ROM / Strength   AROM / PROM / Strength  AROM;Strength      AROM   AROM Assessment Site  Knee    Right/Left Knee  Right;Left    Right Knee Extension  3    Right Knee Flexion  100    Left Knee Extension  0    Left Knee Flexion  150      Strength   Strength Assessment Site  Hip;Knee;Ankle    Right Hip Flexion  4+/5    Right Hip Extension  4/5    Right Hip ABduction  4/5    Left Hip Flexion  4+/5    Left Hip Extension  4/5    Left Hip ABduction  4/5    Right/Left Knee  Right;Left    Right Knee Flexion  4+/5    Right Knee Extension  --   not tested today   Left Knee Flexion  4+/5    Left Knee Extension  5/5    Right Ankle Dorsiflexion  5/5    Right Ankle Plantar Flexion  4/5  Left Ankle Dorsiflexion  5/5    Left Ankle Plantar Flexion  5/5      Ambulation/Gait   Ambulation/Gait  Yes    Ambulation/Gait Assistance  6: Modified  independent (Device/Increase time)    Ambulation Distance (Feet)  424 Feet   2MWT   Assistive device  Crutches    Gait Pattern  Step-through pattern    Ambulation Surface  Level;Indoor    Gait velocity  1.07 m/s    Stairs  Yes    Stair Management Technique  With crutches;No rails;One rail Right    Number of Stairs  4    Height of Stairs  6    Gait Comments  Stairs: performed with bil axillary crutches and then with 1 axillayr crutch and rail (pt ascend leading with Lt LE, descend leading with Rt LE)       Objective measurements completed on examination: See above findings.    [facility] Adult PT Treatment/Exercise - 01/07/18 0001      Exercises   Exercises  Knee/Hip      Knee/Hip Exercises: Supine   Quad Sets  Right;1 set;15 reps       PT Education - 01/07/18 1409    Education Details  Educated on appropriate POC for therapy and on initial HEP within phase II of protocol for ACL repair.    Person(s) Educated  Patient    Methods  Explanation;Handout    Comprehension  Verbalized understanding;Returned demonstration       PT Short Term Goals - 01/07/18 1413      PT SHORT TERM GOAL #1   Title   Patient will be independent with HEP, updated PRN based on stage in protocol, to promote ACL repair healing and stability of Rt knee to return to work activities and PLOF.     Time  2    Period  Weeks    Status  New    Target Date  01/21/18      PT SHORT TERM GOAL #2   Title  Patient will achieve 0-120 Rt knee ROM up to WNL's or equal to Lt LE for improved mobility with gait and stairs and symmetrical mechanics.      Time  4    Period  Weeks    Status  New    Target Date  02/04/18      PT SHORT TERM GOAL #3   Title  Patient will improve MMT by 1/2 grade for all limited groups to indicate significant improvement in functional LE strength    Time  4    Period  Weeks    Status  New       PT Long Term Goals - 01/07/18 1415      PT LONG TERM GOAL #1   Title  Patient will improve  MMT by 1 grade for all limited groups to indicate significant improvement in functional LE strength.     Time  8    Period  Weeks    Status  New    Target Date  03/04/18      PT LONG TERM GOAL #2   Title  Patient will achieve full Rt knee ROM up to WNL's or equal to Lt LE for improved mobility with gait and stairs and symmetrical mechanics.      Time  8    Period  Weeks    Status  New      PT LONG TERM GOAL #3   Title  Patient will demonstrate  safe squat mechanics to progress closed chain exercises within protocol while protecting ACL repair to improve strength and reduce risk for re-injury.     Time  8    Period  Weeks    Status  New      PT LONG TERM GOAL #4   Title  Patient will ambulate at = or > 1.2 m/s with no assistive device and no brace to return to PLOF with community ambulationa nd mobility at work.     Time  8    Period  Weeks    Status  New      PT LONG TERM GOAL #5   Title  Patient will (with MD clearance) report being able to walk for 2 miles on trails with no LOB and no knee pain to return to PLOF with hiking and hunting activities.     Time  8    Period  Weeks    Status  New       Plan - 01/07/18 1418    Clinical Impression Statement  Ms. Feijoo is presents for physical therapy evaluation ~ 2 weeks s/p Rt ACL repair. She was previously treated in our office last February-March for Rt knee pain following a scope and meniscus debridement and was discharged on 06/12/17 after meeting her goals. She since has ruptured her Rt ACL in September and underwent ACL reconstruction on 12/25/17. Today she presents with limited ROM, edema, weakness, impaired balance, and impaired gait. She was able to demonstrate safe ambulation with single axillary crutch under Lt arm. She is currently to remain in her knee brace while weight bearing. She will benefit from skilled PT interventions to address impairments and progress towards goals of returning to work and recreational activities with  hunting, hiking, and outdoor activities.     Clinical Presentation  Stable    Clinical Presentation due to:  ROM, edema, weakness, impaired gait/balance, pain    Clinical Decision Making  Low    Rehab Potential  Good    PT Frequency  2x / week    PT Duration  8 weeks    PT Treatment/Interventions  ADLs/Self Care Home Management;Aquatic Therapy;Cryotherapy;Electrical Stimulation;Moist Heat;DME Instruction;Gait training;Stair training;Functional mobility training;Therapeutic activities;Therapeutic exercise;Balance training;Neuromuscular re-education;Patient/family education;Manual techniques;Passive range of motion;Taping    PT Next Visit Plan  Review evaluation and goals. Progress ROM exercises for Rt knee withint phase II of Dr. Ruthe Mannan ACL protocol. Safe to initiate AROM on bike, hamstring strengthening and quad sets.     PT Home Exercise Plan  Eval: heel slide, quad set, knee flexion in standing    Consulted and Agree with Plan of Care  Patient       Patient will benefit from skilled therapeutic intervention in order to improve the following deficits and impairments:  Abnormal gait, Decreased activity tolerance, Decreased endurance, Decreased range of motion, Decreased strength, Pain, Impaired flexibility, Difficulty walking, Decreased mobility, Decreased coordination, Decreased balance  Visit Diagnosis: Stiffness of right knee, not elsewhere classified  Localized edema  Muscle weakness (generalized)     Problem List Patient Active Problem List   Diagnosis Date Noted  . S/P ACL repair right 12/25/17 05/01/2017  . Vasomotor flushing 08/30/2014  . Pain in joint, shoulder region 12/14/2013  . Muscle weakness (generalized) 12/14/2013  . Decreased range of motion of right shoulder 12/14/2013    Kipp Brood, PT, DPT Physical Therapist with [facility]  01/07/2018 2:34 PM    [place]  [address] [place], [place], [address] Phone: [phone]   Fax:  [phone]  Name: Natalie Hess MRN: [id number] Date of Birth: [birth year]

## 2018-01-08 ENCOUNTER — Encounter (HOSPITAL_COMMUNITY): Payer: Self-pay

## 2018-01-08 ENCOUNTER — Ambulatory Visit (HOSPITAL_COMMUNITY): Payer: 59

## 2018-01-08 DIAGNOSIS — M25661 Stiffness of right knee, not elsewhere classified: Secondary | ICD-10-CM

## 2018-01-08 DIAGNOSIS — R600 Localized edema: Secondary | ICD-10-CM

## 2018-01-08 DIAGNOSIS — M6281 Muscle weakness (generalized): Secondary | ICD-10-CM | POA: Diagnosis not present

## 2018-01-08 NOTE — Therapy (Signed)
[place], [place], [address] Phone: [phone]   Fax:  [phone]  Physical Therapy Treatment  Patient Details  Name: Natalie Hess MRN: [id number] Date of Birth: 09-25-1960 Referring Provider (PT): Arther Abbott   Encounter Date: 01/08/2018  PT End of Session - 01/08/18 0936    Visit Number  2    Number of Visits  17    Date for PT Re-Evaluation  03/04/18    Authorization Type  [place] [employer] (no visit limit, no auth required)    Authorization Time Period  01/07/2018 - 03/06/2018    Authorization - Visit Number  2    Authorization - Number of Visits  10    PT Start Time  0935    PT Stop Time  1019    PT Time Calculation (min)  44 min    Equipment Utilized During Treatment  Other (comment)   Bledso brace   Activity Tolerance  Patient tolerated treatment well    Behavior During Therapy  [facility] for tasks assessed/performed       Past Medical History:  Diagnosis Date  . Medical history non-contributory   . PONV (postoperative nausea and vomiting)    pt states the combination of zofran, decadron and scope patch worked well for her PONV history.    Past Surgical History:  Procedure Laterality Date  . ABDOMINAL HYSTERECTOMY    . ANTERIOR CRUCIATE LIGAMENT REPAIR Right 12/25/2017   Procedure: KNEE ARTHROSCOPY WITH ANTERIOR CRUCIATE LIGAMENT (ACL) REPAIR with Allograft Achilles,  medial and lateral meniscectomy;  Surgeon: Carole Civil, MD;  Location: [facility];  Service: Orthopedics;  Laterality: Right;  . CESAREAN SECTION     X 2  . COLONOSCOPY N/A 02/17/2015   Procedure: COLONOSCOPY;  Surgeon: Rogene Houston, MD;  Location[facility];  Service: Endoscopy;  Laterality: N/A;  930  . KNEE ARTHROSCOPY WITH MEDIAL MENISECTOMY Right 04/24/2017   Procedure: KNEE ARTHROSCOPY WITH PARTIAL MEDIAL MENISECTOMY; ACL DEBRIEDMENT;  Surgeon: Carole Civil, MD;  Location: [facility];  Service: Orthopedics;   Laterality: Right;    There were no vitals filed for this visit.  Subjective Assessment - 01/08/18 0936    Subjective  Pt reports that she's doing well. No pain unless she moves wrong.     Limitations  Standing;Walking;House hold activities    Patient Stated Goals  to drive again and get back to work    Currently in Pain?  No/denies            [facility] Adult PT Treatment/Exercise - 01/08/18 0001      Exercises   Exercises  Knee/Hip      Knee/Hip Exercises: Standing   Heel Raises  Both;20 reps    Heel Raises Limitations  heel and toe    Rocker Board  2 minutes    Rocker Board Limitations  R/L    Gait Training  x1 lap with and without 1 axillary crutch      Knee/Hip Exercises: Seated   Long Arc Quad  Right;15 reps    Long Arc Quad Limitations  90-45deg      Knee/Hip Exercises: Supine   Quad Sets  Right;1 set;15 reps    Quad Sets Limitations  5"    Heel Slides  Right;10 reps    Heel Slides Limitations  5" holds    Straight Leg Raises  Right;15 reps    Straight Leg Raises Limitations  min knee lag  Knee Extension Limitations  1    Knee Flexion Limitations  104      Knee/Hip Exercises: Sidelying   Hip ABduction  Right;15 reps    Hip ADduction  Right;15 reps      Knee/Hip Exercises: Prone   Straight Leg Raises  Right;15 reps      Manual Therapy   Manual Therapy  Edema management;Soft tissue mobilization    Manual therapy comments  separate rest of treatment    Edema Management  retro massage with BLE elevated to reduce swelling    Soft tissue mobilization  light STM along distal scar for pain control             PT Education - 01/08/18 0936    Education Details  reviewed goals, exercise technique    Person(s) Educated  Patient    Methods  Explanation;Demonstration    Comprehension  Verbalized understanding;Returned demonstration       PT Short Term Goals - 01/07/18 1413      PT SHORT TERM GOAL #1   Title   Patient will be independent with HEP,  updated PRN based on stage in protocol, to promote ACL repair healing and stability of Rt knee to return to work activities and PLOF.     Time  2    Period  Weeks    Status  New    Target Date  01/21/18      PT SHORT TERM GOAL #2   Title  Patient will achieve 0-120 Rt knee ROM up to WNL's or equal to Lt LE for improved mobility with gait and stairs and symmetrical mechanics.      Time  4    Period  Weeks    Status  New    Target Date  02/04/18      PT SHORT TERM GOAL #3   Title  Patient will improve MMT by 1/2 grade for all limited groups to indicate significant improvement in functional LE strength    Time  4    Period  Weeks    Status  New        PT Long Term Goals - 01/07/18 1415      PT LONG TERM GOAL #1   Title  Patient will improve MMT by 1 grade for all limited groups to indicate significant improvement in functional LE strength.     Time  8    Period  Weeks    Status  New    Target Date  03/04/18      PT LONG TERM GOAL #2   Title  Patient will achieve full Rt knee ROM up to WNL's or equal to Lt LE for improved mobility with gait and stairs and symmetrical mechanics.      Time  8    Period  Weeks    Status  New      PT LONG TERM GOAL #3   Title  Patient will demonstrate safe squat mechanics to progress closed chain exercises within protocol while protecting ACL repair to improve strength and reduce risk for re-injury.     Time  8    Period  Weeks    Status  New      PT LONG TERM GOAL #4   Title  Patient will ambulate at = or > 1.2 m/s with no assistive device and no brace to return to PLOF with community ambulationa nd mobility at work.     Time  8    Period  Weeks    Status  New      PT LONG TERM GOAL #5   Title  Patient will (with MD clearance) report being able to walk for 2 miles on trails with no LOB and no knee pain to return to PLOF with hiking and hunting activities.     Time  8    Period  Weeks    Status  New            Plan - 01/08/18  1021    Clinical Impression Statement  Began session by reviewing goals with no f/u questions. Today's session focused on ROM and quad activation as well as gait, all within pt's 2 week post-op protocol. No pain reported during therex, only min cues required for form. Pt able to ambulate with and without crutches this date; no pain and no evidence of antalgia/limping noted. PT educated pt that she could ambulate in home without crutches but to continue to use at least one when in the community for safety and she verbalized understanding. Ended session with manual for edema and pain control. AROM 1 to 104deg this date.     Rehab Potential  Good    PT Frequency  2x / week    PT Duration  8 weeks    PT Treatment/Interventions  ADLs/Self Care Home Management;Aquatic Therapy;Cryotherapy;Electrical Stimulation;Moist Heat;DME Instruction;Gait training;Stair training;Functional mobility training;Therapeutic activities;Therapeutic exercise;Balance training;Neuromuscular re-education;Patient/family education;Manual techniques;Passive range of motion;Taping    PT Next Visit Plan  Progress ROM exercises for Rt knee withint phase II of Dr. Ruthe Mannan ACL protocol. Safe to initiate AROM on bike, hamstring strengthening and quad sets.     PT Home Exercise Plan  Eval: heel slide, quad set, knee flexion in standing    Consulted and Agree with Plan of Care  Patient       Patient will benefit from skilled therapeutic intervention in order to improve the following deficits and impairments:  Abnormal gait, Decreased activity tolerance, Decreased endurance, Decreased range of motion, Decreased strength, Pain, Impaired flexibility, Difficulty walking, Decreased mobility, Decreased coordination, Decreased balance  Visit Diagnosis: Stiffness of right knee, not elsewhere classified  Localized edema  Muscle weakness (generalized)     Problem List Patient Active Problem List   Diagnosis Date Noted  . S/P ACL repair  right 12/25/17 05/01/2017  . Vasomotor flushing 08/30/2014  . Pain in joint, shoulder region 12/14/2013  . Muscle weakness (generalized) 12/14/2013  . Decreased range of motion of right shoulder 12/14/2013        Geraldine Solar PT, DPT   [place] [address] [place], [place], [address] Phone: [phone]   Fax:  [phone]  Name: Natalie Hess MRN: [id number] Date of Birth: 1961/01/16

## 2018-01-13 ENCOUNTER — Ambulatory Visit (HOSPITAL_COMMUNITY): Payer: 59 | Admitting: Physical Therapy

## 2018-01-13 DIAGNOSIS — M6281 Muscle weakness (generalized): Secondary | ICD-10-CM | POA: Diagnosis not present

## 2018-01-13 DIAGNOSIS — R600 Localized edema: Secondary | ICD-10-CM | POA: Diagnosis not present

## 2018-01-13 DIAGNOSIS — M25661 Stiffness of right knee, not elsewhere classified: Secondary | ICD-10-CM

## 2018-01-13 NOTE — Therapy (Signed)
[place]atal[place], [place], [address] Phone: [phone]   Fax:  [phone]  Physical Therapy Treatment  Patient Details  Name: Natalie Hess MRN: [id number] Date of Birth: 01-06-1961 Referring Provider (PT): Arther Abbott   Encounter Date: 01/13/2018  PT End of Session - 01/13/18 1200    Visit Number  3    Number of Visits  17    Date for PT Re-Evaluation  03/04/18    Authorization Type   [employer] (no visit limit, no auth required)    Authorization Time Period  01/07/2018 - 03/06/2018    Authorization - Visit Number  3    Authorization - Number of Visits  10    PT Start Time  1120    PT Stop Time  1158    PT Time Calculation (min)  38 min    Equipment Utilized During Treatment  Other (comment)   Bledso brace   Activity Tolerance  Patient tolerated treatment well    Behavior During Therapy  [facility] for tasks assessed/performed       Past Medical History:  Diagnosis Date  . Medical history non-contributory   . PONV (postoperative nausea and vomiting)    pt states the combination of zofran, decadron and scope patch worked well for her PONV history.    Past Surgical History:  Procedure Laterality Date  . ABDOMINAL HYSTERECTOMY    . ANTERIOR CRUCIATE LIGAMENT REPAIR Right 12/25/2017   Procedure: KNEE ARTHROSCOPY WITH ANTERIOR CRUCIATE LIGAMENT (ACL) REPAIR with Allograft Achilles,  medial and lateral meniscectomy;  Surgeon: Carole Civil, MD;  Location: [facility];  Service: Orthopedics;  Laterality: Right;  . CESAREAN SECTION     X 2  . COLONOSCOPY N/A 02/17/2015   Procedure: COLONOSCOPY;  Surgeon: Rogene Houston, MD;  Location: [facility];  Service: Endoscopy;  Laterality: N/A;  930  . KNEE ARTHROSCOPY WITH MEDIAL MENISECTOMY Right 04/24/2017   Procedure: KNEE ARTHROSCOPY WITH PARTIAL MEDIAL MENISECTOMY; ACL DEBRIEDMENT;  Surgeon: Carole Civil, MD;  Location: [facility];  Service: Orthopedics;   Laterality: Right;    There were no vitals filed for this visit.                    [facility] Adult PT Treatment/Exercise - 01/13/18 0001      Exercises   Exercises  Knee/Hip      Knee/Hip Exercises: Standing   Heel Raises  Both;20 reps    Heel Raises Limitations  heel and toe    Gait Training  x1 lap with and without 1 axillary crutch      Knee/Hip Exercises: Supine   Quad Sets  Right;1 set;15 reps    Heel Slides  Right;10 reps    Heel Slides Limitations  5" holds    Straight Leg Raises  Right;15 reps    Straight Leg Raises Limitations  min knee lag    Knee Extension Limitations  1    Knee Flexion Limitations  110      Knee/Hip Exercises: Sidelying   Hip ABduction  Right;15 reps    Hip ADduction  Right;15 reps      Knee/Hip Exercises: Prone   Straight Leg Raises  Right;15 reps      Manual Therapy   Manual Therapy  Edema management;Soft tissue mobilization    Manual therapy comments  separate rest of treatment    Edema Management  retro massage with BLE elevated to reduce swelling  Soft tissue mobilization  light STM along distal scar for pain control               PT Short Term Goals - 01/07/18 1413      PT SHORT TERM GOAL #1   Title   Patient will be independent with HEP, updated PRN based on stage in protocol, to promote ACL repair healing and stability of Rt knee to return to work activities and PLOF.     Time  2    Period  Weeks    Status  New    Target Date  01/21/18      PT SHORT TERM GOAL #2   Title  Patient will achieve 0-120 Rt knee ROM up to WNL's or equal to Lt LE for improved mobility with gait and stairs and symmetrical mechanics.      Time  4    Period  Weeks    Status  New    Target Date  02/04/18      PT SHORT TERM GOAL #3   Title  Patient will improve MMT by 1/2 grade for all limited groups to indicate significant improvement in functional LE strength    Time  4    Period  Weeks    Status  New        PT Long Term  Goals - 01/07/18 1415      PT LONG TERM GOAL #1   Title  Patient will improve MMT by 1 grade for all limited groups to indicate significant improvement in functional LE strength.     Time  8    Period  Weeks    Status  New    Target Date  03/04/18      PT LONG TERM GOAL #2   Title  Patient will achieve full Rt knee ROM up to WNL's or equal to Lt LE for improved mobility with gait and stairs and symmetrical mechanics.      Time  8    Period  Weeks    Status  New      PT LONG TERM GOAL #3   Title  Patient will demonstrate safe squat mechanics to progress closed chain exercises within protocol while protecting ACL repair to improve strength and reduce risk for re-injury.     Time  8    Period  Weeks    Status  New      PT LONG TERM GOAL #4   Title  Patient will ambulate at = or > 1.2 m/s with no assistive device and no brace to return to PLOF with community ambulationa nd mobility at work.     Time  8    Period  Weeks    Status  New      PT LONG TERM GOAL #5   Title  Patient will (with MD clearance) report being able to walk for 2 miles on trails with no LOB and no knee pain to return to PLOF with hiking and hunting activities.     Time  8    Period  Weeks    Status  New            Plan - 01/13/18 1201    Clinical Impression Statement  Pt continues to improve.  Able to ambulate without AD but with brace without pain or antalgia noted.  Completed therex without pain or issues as well.  Pt with improved ROM today 1-110 degrees.  Pt is currently in week 2  of protocol.      Rehab Potential  Good    PT Frequency  2x / week    PT Duration  8 weeks    PT Treatment/Interventions  ADLs/Self Care Home Management;Aquatic Therapy;Cryotherapy;Electrical Stimulation;Moist Heat;DME Instruction;Gait training;Stair training;Functional mobility training;Therapeutic activities;Therapeutic exercise;Balance training;Neuromuscular re-education;Patient/family education;Manual techniques;Passive  range of motion;Taping    PT Next Visit Plan  Progress ROM exercises for Rt knee within phase II of Dr. Ruthe Mannan ACL protocol. Safe to initiate AROM on bike, hamstring strengthening and quad sets. Next session begin bike and wallslides.      PT Home Exercise Plan  Eval: heel slide, quad set, knee flexion in standing    Consulted and Agree with Plan of Care  Patient       Patient will benefit from skilled therapeutic intervention in order to improve the following deficits and impairments:  Abnormal gait, Decreased activity tolerance, Decreased endurance, Decreased range of motion, Decreased strength, Pain, Impaired flexibility, Difficulty walking, Decreased mobility, Decreased coordination, Decreased balance  Visit Diagnosis: Stiffness of right knee, not elsewhere classified  Localized edema  Muscle weakness (generalized)     Problem List Patient Active Problem List   Diagnosis Date Noted  . S/P ACL repair right 12/25/17 05/01/2017  . Vasomotor flushing 08/30/2014  . Pain in joint, shoulder region 12/14/2013  . Muscle weakness (generalized) 12/14/2013  . Decreased range of motion of right shoulder 12/14/2013   Teena Irani, PTA/CLT [phone]  Teena Irani 01/13/2018, 12:04 PM  [facility] [address] [place], [place], [address] Phone: [phone]   Fax:  [phone]  Name: MAEVE DEBORD MRN: [id number] Date of Birth: 06-30-1960

## 2018-01-15 ENCOUNTER — Ambulatory Visit (HOSPITAL_COMMUNITY): Payer: 59 | Admitting: Physical Therapy

## 2018-01-15 ENCOUNTER — Encounter (HOSPITAL_COMMUNITY): Payer: Self-pay | Admitting: Physical Therapy

## 2018-01-15 DIAGNOSIS — M25661 Stiffness of right knee, not elsewhere classified: Secondary | ICD-10-CM

## 2018-01-15 DIAGNOSIS — M6281 Muscle weakness (generalized): Secondary | ICD-10-CM

## 2018-01-15 DIAGNOSIS — R600 Localized edema: Secondary | ICD-10-CM

## 2018-01-15 NOTE — Therapy (Signed)
[facility] [place], [place], [address] Phone: [phone]   Fax:  [phone]  Physical Therapy Treatment  Patient Details  Name: Natalie Hess MRN: [id number] Date of Birth: [birth year] Referring Provider (PT): Arther Abbott   Encounter Date: 01/15/2018  PT End of Session - 01/15/18 1014    Visit Number  4    Number of Visits  17    Date for PT Re-Evaluation  03/04/18    Authorization Type  [place] [employer] (no visit limit, no auth required)    Authorization Time Period  01/07/2018 - 03/06/2018    Authorization - Visit Number  4    Authorization - Number of Visits  10    PT Start Time  0950    PT Stop Time  1030    PT Time Calculation (min)  40 min    Equipment Utilized During Treatment  Other (comment)   Bledso brace   Activity Tolerance  Patient tolerated treatment well    Behavior During Therapy  [facility] for tasks assessed/performed       Past Medical History:  Diagnosis Date  . Medical history non-contributory   . PONV (postoperative nausea and vomiting)    pt states the combination of zofran, decadron and scope patch worked well for her PONV history.    Past Surgical History:  Procedure Laterality Date  . ABDOMINAL HYSTERECTOMY    . ANTERIOR CRUCIATE LIGAMENT REPAIR Right 12/25/2017   Procedure: KNEE ARTHROSCOPY WITH ANTERIOR CRUCIATE LIGAMENT (ACL) REPAIR with Allograft Achilles,  medial and lateral meniscectomy;  Surgeon: Carole Civil, MD;  Location: [facility];  Service: Orthopedics;  Laterality: Right;  . CESAREAN SECTION     X 2  . COLONOSCOPY N/A 02/17/2015   Procedure: COLONOSCOPY;  Surgeon: Rogene Houston, MD;  Location: [facility];  Service: Endoscopy;  Laterality: N/A;  930  . KNEE ARTHROSCOPY WITH MEDIAL MENISECTOMY Right 04/24/2017   Procedure: KNEE ARTHROSCOPY WITH PARTIAL MEDIAL MENISECTOMY; ACL DEBRIEDMENT;  Surgeon: Carole Civil, MD;  Location: [facility];  Service: Orthopedics;   Laterality: Right;    There were no vitals filed for this visit.  Subjective Assessment - 01/15/18 0950    Subjective  PT states that she is feeling a little tight but no real pain.      Limitations  Standing;Walking;House hold activities    Patient Stated Goals  to drive again and get back to work    Currently in Pain?  No/denies             [facility] Adult PT Treatment/Exercise - 01/15/18 0001      Exercises   Exercises  Knee/Hip      Knee/Hip Exercises: Aerobic   Stationary Bike  seat at 12 will be able to go to 11 next visit x 5'      Knee/Hip Exercises: Standing   Heel Raises  Both;20 reps      Knee/Hip Exercises: Seated   Long Arc Quad  Right;10 reps    Long Arc Quad Limitations  90-45deg      Knee/Hip Exercises: Supine   Quad Sets  Right;15 reps    Heel Slides  Right;10 reps    Straight Leg Raises  Right;10 reps    Knee Extension Limitations  1    Knee Flexion Limitations  118    Other Supine Knee/Hip Exercises  wall slide x 10       Knee/Hip Exercises: Sidelying   Hip  ABduction  Right;10 reps    Hip ABduction Limitations  3#    Hip ADduction  Right;10 reps      Knee/Hip Exercises: Prone   Hamstring Curl  10 reps    Hamstring Curl Limitations  3#    Hip Extension  Strengthening;Right;10 reps    Straight Leg Raises  Right;10 reps             PT Education - 01/15/18 1135    Education Details  updated HEP    Person(s) Educated  Patient    Methods  Verbal cues;Explanation    Comprehension  Verbalized understanding;Returned demonstration       PT Short Term Goals - 01/15/18 1023      PT SHORT TERM GOAL #1   Title   Patient will be independent with HEP, updated PRN based on stage in protocol, to promote ACL repair healing and stability of Rt knee to return to work activities and PLOF.     Time  2    Period  Weeks    Status  On-going      PT SHORT TERM GOAL #2   Title  Patient will achieve 0-120 Rt knee ROM up to WNL's or equal to Lt LE for  improved mobility with gait and stairs and symmetrical mechanics.      Time  4    Period  Weeks    Status  On-going      PT SHORT TERM GOAL #3   Title  Patient will improve MMT by 1/2 grade for all limited groups to indicate significant improvement in functional LE strength    Time  4    Period  Weeks    Status  On-going      PT SHORT TERM GOAL #4   Status  On-going        PT Long Term Goals - 01/15/18 1023      PT LONG TERM GOAL #1   Title  Patient will improve MMT by 1 grade for all limited groups to indicate significant improvement in functional LE strength.     Time  8    Period  Weeks    Status On-going      PT LONG TERM GOAL #2   Title  Patient will achieve full Rt knee ROM up to WNL's or equal to Lt LE for improved mobility with gait and stairs and symmetrical mechanics.      Time  8    Period  Weeks    Status On-going      PT LONG TERM GOAL #3   Title  Patient will demonstrate safe squat mechanics to progress closed chain exercises within protocol while protecting ACL repair to improve strength and reduce risk for re-injury.     Time  8    Period  Weeks    Status On-going     PT LONG TERM GOAL #4   Title  Patient will ambulate at = or > 1.2 m/s with no assistive device and no brace to return to PLOF with community ambulationa nd mobility at work.     Time  8    Period  Weeks    Status On-going     PT LONG TERM GOAL #5   Title  Patient will (with MD clearance) report being able to walk for 2 miles on trails with no LOB and no knee pain to return to PLOF with hiking and hunting activities.     Time  8  Period  Weeks    Status  on-going           Plan - 01/15/18 1015    Clinical Impression Statement  Added wt to program.  Pt increased ROM to 118 today.  Pt continues to be very motivated.  Pt will continut to benefit from skilled therapy to progress to allow pt to ambulate without assistive device and knee brace.     History and Personal Factors  relevant to plan of care:  second knee surgery.     Clinical Presentation  Stable    Rehab Potential  Good    PT Frequency  2x / week    PT Duration  8 weeks    PT Treatment/Interventions  ADLs/Self Care Home Management;Aquatic Therapy;Cryotherapy;Electrical Stimulation;Moist Heat;DME Instruction;Gait training;Stair training;Functional mobility training;Therapeutic activities;Therapeutic exercise;Balance training;Neuromuscular re-education;Patient/family education;Manual techniques;Passive range of motion;Taping    PT Next Visit Plan  Progress ROM exercises for Rt knee within phase II of Dr. Ruthe Mannan ACL protocol. Next session begin stool scoots and double leg Baps; unlock brace to 110 .  This therapist messaged MD to see if we can unlock pt brace to 90 degree flexion.   MD responded brace can be up to 110.     PT Home Exercise Plan  Eval: heel slide, quad set, knee flexion in standing; 10/31;  SLR all planes; prone knee flexion and wall slides     Consulted and Agree with Plan of Care  Patient       Patient will benefit from skilled therapeutic intervention in order to improve the following deficits and impairments:  Abnormal gait, Decreased activity tolerance, Decreased endurance, Decreased range of motion, Decreased strength, Pain, Impaired flexibility, Difficulty walking, Decreased mobility, Decreased coordination, Decreased balance  Visit Diagnosis: Stiffness of right knee, not elsewhere classified  Localized edema  Muscle weakness (generalized)     Problem List Patient Active Problem List   Diagnosis Date Noted  . S/P ACL repair right 12/25/17 05/01/2017  . Vasomotor flushing 08/30/2014  . Pain in joint, shoulder region 12/14/2013  . Muscle weakness (generalized) 12/14/2013  . Decreased range of motion of right shoulder 12/14/2013    Rayetta Humphrey, PT CLT [phone] 01/15/2018, 11:52 AM  [facility] [place], [place], [address] Phone: [phone]   Fax:  [phone]  Name: Natalie Hess MRN: [id number] Date of Birth: 17-Apr-1960

## 2018-01-15 NOTE — Patient Instructions (Addendum)
Knee Wall Slide    Slowly "walk" or slide feet on wall toward floor until stretch is felt in knees. Repeat _10___ times per set. Do _1___ sets per session. Do __2__ sessions per day.  http://orth.exer.us/674   Copyright  VHI. All rights reserved.  Self-Mobilization: Knee Flexion (Prone)    Bring right heel toward buttocks as close as possible. Hold _3___ seconds. Relax. Repeat _10___ times per set. Do ___1_ sets per session. Do ___2_ sessions per day.  http://orth.exer.us/596   Copyright  VHI. All rights reserved.  Strengthening: Hip Extension (Prone)    Tighten muscles on front of right thigh, then lift leg _2___ inches from surface, keeping knee locked. Repeat _10___ times per set. Do _1___ sets per session. Do _2___ sessions per day.  http://orth.exer.us/620   Copyright  VHI. All rights reserved.  Strengthening: Hip Abduction (Side-Lying)    Tighten muscles on front of right thigh, then lift leg __12__ inches from surface, keeping knee locked.  Repeat ___10_ times per set. Do ___1_ sets per session. Do _2___ sessions per day.  http://orth.exer.us/622   Copyright  VHI. All rights reserved.  Strengthening: Straight Leg Raise (Phase 1)    Tighten muscles on front of right thigh, then lift leg __15__ inches from surface, keeping knee locked.  Repeat __10__ times per set. Do _1___ sets per session. Do _2___ sessions per day.  http://orth.exer.us/614   Copyright  VHI. All rights reserved.  Strengthening: Hip Adduction (Side-Lying)    Tighten muscles on front of right thigh, then lift leg __2__ inches from surface, keeping knee locked.  Repeat 10____ times per set. Do _1___ sets per session. Do _2___ sessions per day.  http://orth.exer.us/624   Copyright  VHI. All rights reserved.

## 2018-01-20 ENCOUNTER — Ambulatory Visit (HOSPITAL_COMMUNITY): Payer: 59 | Attending: Orthopedic Surgery | Admitting: Physical Therapy

## 2018-01-20 DIAGNOSIS — M6281 Muscle weakness (generalized): Secondary | ICD-10-CM | POA: Insufficient documentation

## 2018-01-20 DIAGNOSIS — R600 Localized edema: Secondary | ICD-10-CM | POA: Diagnosis not present

## 2018-01-20 DIAGNOSIS — M25661 Stiffness of right knee, not elsewhere classified: Secondary | ICD-10-CM | POA: Insufficient documentation

## 2018-01-20 NOTE — Therapy (Signed)
[place], [place], [address] Phone: [phone]   Fax:  [phone]  Physical Therapy Treatment  Patient Details  Name: Natalie Hess MRN: [id number] Date of Birth: [birth year] Referring Provider (PT): Arther Abbott   Encounter Date: 01/20/2018  PT End of Session - 01/20/18 1319    Visit Number  5    Number of Visits  17    Date for PT Re-Evaluation  03/04/18    Authorization Type  [place] [employer] (no visit limit, no auth required)    Authorization Time Period  01/07/2018 - 03/06/2018    Authorization - Visit Number  5    Authorization - Number of Visits  10    PT Start Time  2297    PT Stop Time  1135    PT Time Calculation (min)  51 min    Equipment Utilized During Treatment  Other (comment)   Bledso brace   Activity Tolerance  Patient tolerated treatment well    Behavior During Therapy  [facility] for tasks assessed/performed       Past Medical History:  Diagnosis Date  . Medical history non-contributory   . PONV (postoperative nausea and vomiting)    pt states the combination of zofran, decadron and scope patch worked well for her PONV history.    Past Surgical History:  Procedure Laterality Date  . ABDOMINAL HYSTERECTOMY    . ANTERIOR CRUCIATE LIGAMENT REPAIR Right 12/25/2017   Procedure: KNEE ARTHROSCOPY WITH ANTERIOR CRUCIATE LIGAMENT (ACL) REPAIR with Allograft Achilles,  medial and lateral meniscectomy;  Surgeon: Carole Civil, MD;  Location: [facility];  Service: Orthopedics;  Laterality: Right;  . CESAREAN SECTION     X 2  . COLONOSCOPY N/A 02/17/2015   Procedure: COLONOSCOPY;  Surgeon: Rogene Houston, MD;  [facility];  Service: Endoscopy;  Laterality: N/A;  930  . KNEE ARTHROSCOPY WITH MEDIAL MENISECTOMY Right 04/24/2017   Procedure: KNEE ARTHROSCOPY WITH PARTIAL MEDIAL MENISECTOMY; ACL DEBRIEDMENT;  Surgeon: Carole Civil, MD;  Location: [facility];  Service: Orthopedics;   Laterality: Right;    There were no vitals filed for this visit.  Subjective Assessment - 01/20/18 1336    Subjective  Pt states she is doing well today with no pain present.  Walking without AD at this time.  Eager to drive.    Currently in Pain?  No/denies                       [facility] Adult PT Treatment/Exercise - 01/20/18 0001      Exercises   Exercises  Knee/Hip      Knee/Hip Exercises: Aerobic   Stationary Bike  seat 10 5 minutes at end of session (n/c)      Knee/Hip Exercises: Standing   Heel Raises  Both;20 reps    Heel Raises Limitations  heel and toe    Wall Squat  10 reps;10 seconds    Wall Squat Limitations   45 degrees    Other Standing Knee Exercises  brace 0-110    Other Standing Knee Exercises  BAPs level 3 all directions 10 reps bilateral UE's/opposite foot resting on board      Knee/Hip Exercises: Seated   Stool Scoot - Round Trips  1 RT      Knee/Hip Exercises: Supine   Heel Slides  Right;5 reps    Straight Leg Raises  Right;20 reps    Knee Extension Limitations  0    Knee Flexion Limitations  120    Other Supine Knee/Hip Exercises  --      Knee/Hip Exercises: Sidelying   Hip ABduction  Right;15 reps;2 sets    Hip ABduction Limitations  3#    Hip ADduction  Right      Knee/Hip Exercises: Prone   Hamstring Curl  15 reps;2 sets    Hamstring Curl Limitations  3#             PT Education - 01/20/18 1318    Education Details  informed of MD instructions to increase flexion on brace to 110.  Instructed to continue wearing brace with ambution, focusing on heel toe amb/knee flexion     Person(s) Educated  Patient    Methods  Explanation;Demonstration    Comprehension  Verbalized understanding;Returned demonstration       PT Short Term Goals - 01/15/18 1023      PT SHORT TERM GOAL #1   Title   Patient will be independent with HEP, updated PRN based on stage in protocol, to promote ACL repair healing and stability of Rt knee to  return to work activities and PLOF.     Time  2    Period  Weeks    Status  On-going      PT SHORT TERM GOAL #2   Title  Patient will achieve 0-120 Rt knee ROM up to WNL's or equal to Lt LE for improved mobility with gait and stairs and symmetrical mechanics.      Time  4    Period  Weeks    Status  On-going      PT SHORT TERM GOAL #3   Title  Patient will improve MMT by 1/2 grade for all limited groups to indicate significant improvement in functional LE strength    Time  4    Period  Weeks    Status  On-going      PT SHORT TERM GOAL #4   Status  On-going        PT Long Term Goals - 01/15/18 1023      PT LONG TERM GOAL #1   Title  Patient will improve MMT by 1 grade for all limited groups to indicate significant improvement in functional LE strength.     Time  8    Period  Weeks    Status  On-going      PT LONG TERM GOAL #2   Title  Patient will achieve full Rt knee ROM up to WNL's or equal to Lt LE for improved mobility with gait and stairs and symmetrical mechanics.      Time  8    Period  Weeks    Status  On-going      PT LONG TERM GOAL #3   Title  Patient will demonstrate safe squat mechanics to progress closed chain exercises within protocol while protecting ACL repair to improve strength and reduce risk for re-injury.     Time  8    Period  Weeks    Status  On-going      PT LONG TERM GOAL #4   Title  Patient will ambulate at = or > 1.2 m/s with no assistive device and no brace to return to PLOF with community ambulationa nd mobility at work.     Time  8    Period  Weeks    Status  On-going      PT LONG TERM GOAL #5  Title  Patient will (with MD clearance) report being able to walk for 2 miles on trails with no LOB and no knee pain to return to PLOF with hiking and hunting activities.     Time  8    Period  Weeks    Status  On-going            Plan - 01/20/18 1320    Clinical Impression Statement  this thursday will be week 4 of [facility] post-op  ACL protocol.  Brace increased to 110 degrees flexion (per MD) and able to increase sets of mat strengthening using weight.  ROM 0-120 degrees today and into stage II of procol.  Added wallsitting to 10 seconds, stool scoots and double leg BAPS.  Pt ablt to ambulate without AD at this time.  Pt without complains or issues at end of session.     Rehab Potential  Good    PT Frequency  2x / week    PT Duration  8 weeks    PT Treatment/Interventions  ADLs/Self Care Home Management;Aquatic Therapy;Cryotherapy;Electrical Stimulation;Moist Heat;DME Instruction;Gait training;Stair training;Functional mobility training;Therapeutic activities;Therapeutic exercise;Balance training;Neuromuscular re-education;Patient/family education;Manual techniques;Passive range of motion;Taping    PT Next Visit Plan  Progress ROM exercises for Rt knee within phase II of Dr. Ruthe Mannan ACL protocol. Next session begin retro gait on treadmill, double leg press (low weight/high reps), add weight to SLR.  Progress to biodex isokinetic 90-40 with high speeds at week 6.     PT Home Exercise Plan  Eval: heel slide, quad set, knee flexion in standing; 10/31;  SLR all planes; prone knee flexion and wall slides     Consulted and Agree with Plan of Care  Patient       Patient will benefit from skilled therapeutic intervention in order to improve the following deficits and impairments:  Abnormal gait, Decreased activity tolerance, Decreased endurance, Decreased range of motion, Decreased strength, Pain, Impaired flexibility, Difficulty walking, Decreased mobility, Decreased coordination, Decreased balance  Visit Diagnosis: Stiffness of right knee, not elsewhere classified  Localized edema  Muscle weakness (generalized)     Problem List Patient Active Problem List   Diagnosis Date Noted  . S/P ACL repair right 12/25/17 05/01/2017  . Vasomotor flushing 08/30/2014  . Pain in joint, shoulder region 12/14/2013  . Muscle weakness  (generalized) 12/14/2013  . Decreased range of motion of right shoulder 12/14/2013   Teena Irani, PTA/CLT [phone]  Teena Irani 01/20/2018, 1:37 PM  [place] [address] [place], [place], [address] Phone: [phone]   Fax:  [phone]  Name: Natalie Hess MRN: [id number] Date of Birth: 1960-04-12

## 2018-01-21 ENCOUNTER — Encounter (HOSPITAL_COMMUNITY): Payer: Self-pay | Admitting: Orthopedic Surgery

## 2018-01-21 DIAGNOSIS — S83511A Sprain of anterior cruciate ligament of right knee, initial encounter: Secondary | ICD-10-CM

## 2018-01-21 NOTE — Progress Notes (Signed)
Ok done

## 2018-01-22 ENCOUNTER — Ambulatory Visit (HOSPITAL_COMMUNITY): Payer: 59 | Admitting: Physical Therapy

## 2018-01-22 ENCOUNTER — Encounter (HOSPITAL_COMMUNITY): Payer: Self-pay | Admitting: Physical Therapy

## 2018-01-22 DIAGNOSIS — M6281 Muscle weakness (generalized): Secondary | ICD-10-CM

## 2018-01-22 DIAGNOSIS — M25661 Stiffness of right knee, not elsewhere classified: Secondary | ICD-10-CM | POA: Diagnosis not present

## 2018-01-22 DIAGNOSIS — R600 Localized edema: Secondary | ICD-10-CM

## 2018-01-22 NOTE — Therapy (Signed)
[place], [place], [address] Phone: [phone]   Fax:  [phone]  Physical Therapy Treatment  Patient Details  Name: Natalie Hess MRN: [id number] Date of Birth: 1960/04/08 Referring Provider (PT): Arther Abbott   Encounter Date: 01/22/2018  PT End of Session - 01/22/18 1059    Visit Number  6    Number of Visits  17    Date for PT Re-Evaluation  03/04/18    Authorization Type  [place] [employer] (no visit limit, no auth required)    Authorization Time Period  01/07/2018 - 03/06/2018    Authorization - Visit Number  6    Authorization - Number of Visits  10    PT Start Time  1030    PT Stop Time  1113    PT Time Calculation (min)  43 min    Equipment Utilized During Treatment  Other (comment)   Bledso brace   Activity Tolerance  Patient tolerated treatment well    Behavior During Therapy  [facility] for tasks assessed/performed       Past Medical History:  Diagnosis Date  . Medical history non-contributory   . PONV (postoperative nausea and vomiting)    pt states the combination of zofran, decadron and scope patch worked well for her PONV history.    Past Surgical History:  Procedure Laterality Date  . ABDOMINAL HYSTERECTOMY    . ANTERIOR CRUCIATE LIGAMENT REPAIR Right 12/25/2017   Procedure: KNEE ARTHROSCOPY WITH ANTERIOR CRUCIATE LIGAMENT (ACL) REPAIR with Allograft Achilles,  medial and lateral meniscectomy;  Surgeon: Carole Civil, MD;  Location: [facility];  Service: Orthopedics;  Laterality: Right;  . CESAREAN SECTION     X 2  . COLONOSCOPY N/A 02/17/2015   Procedure: COLONOSCOPY;  Surgeon: Rogene Houston, MD;  [facility];  Service: Endoscopy;  Laterality: N/A;  930  . KNEE ARTHROSCOPY WITH MEDIAL MENISECTOMY Right 04/24/2017   Procedure: KNEE ARTHROSCOPY WITH PARTIAL MEDIAL MENISECTOMY; ACL DEBRIEDMENT;  Surgeon: Carole Civil, MD;  Location: [facility];  Service: Orthopedics;   Laterality: Right;    There were no vitals filed for this visit.  Subjective Assessment - 01/22/18 1034    Subjective  PT states that she has been walking about 15 minutes twice a day her calf is sore but her knee feels much better.     Limitations  Standing;Walking;House hold activities    Patient Stated Goals  to drive again and get back to work    Currently in Pain?  No/denies              [facility] Adult PT Treatment/Exercise - 01/22/18 0001      Exercises   Exercises  Knee/Hip      Knee/Hip Exercises: Aerobic   Stationary Bike  seat 9 5 minutes at end of session (n/c)    Recumbent Bike  --      Knee/Hip Exercises: Machines for Strengthening   Other Machine  leg press 2 pl 20 reps       Knee/Hip Exercises: Standing   Heel Raises  Right;10 reps   rt only    Heel Raises Limitations  --    Forward Step Up  Right;10 reps    Wall Squat  10 reps;10 seconds    Wall Squat Limitations   45 degrees x 15 seconds     SLS  60 seconds onto vectors     Other Standing Knee Exercises  brace  0-110    Other Standing Knee Exercises  BAPs level 3 all directions 10 reps bilateral UE's/opposite foot resting on board      Knee/Hip Exercises: Seated   Stool Scoot - Round Trips  1 RT      Knee/Hip Exercises: Supine   Heel Slides  Right;5 reps    Straight Leg Raises  Right;10 reps    Straight Leg Raises Limitations  3    Knee Extension Limitations  0    Knee Flexion Limitations  120      Knee/Hip Exercises: Sidelying   Hip ABduction  Right;15 reps    Hip ABduction Limitations  5    Hip ADduction  Right      Knee/Hip Exercises: Prone   Hamstring Curl  15 reps    Hamstring Curl Limitations  7.5#               PT Short Term Goals - 01/15/18 1023      PT SHORT TERM GOAL #1   Title   Patient will be independent with HEP, updated PRN based on stage in protocol, to promote ACL repair healing and stability of Rt knee to return to work activities and PLOF.     Time  2     Period  Weeks    Status  On-going      PT SHORT TERM GOAL #2   Title  Patient will achieve 0-120 Rt knee ROM up to WNL's or equal to Lt LE for improved mobility with gait and stairs and symmetrical mechanics.      Time  4    Period  Weeks    Status  On-going      PT SHORT TERM GOAL #3   Title  Patient will improve MMT by 1/2 grade for all limited groups to indicate significant improvement in functional LE strength    Time  4    Period  Weeks    Status  On-going      PT SHORT TERM GOAL #4   Status  On-going        PT Long Term Goals - 01/15/18 1023      PT LONG TERM GOAL #1   Title  Patient will improve MMT by 1 grade for all limited groups to indicate significant improvement in functional LE strength.     Time  8    Period  Weeks    Status  On-going      PT LONG TERM GOAL #2   Title  Patient will achieve full Rt knee ROM up to WNL's or equal to Lt LE for improved mobility with gait and stairs and symmetrical mechanics.      Time  8    Period  Weeks    Status  On-going      PT LONG TERM GOAL #3   Title  Patient will demonstrate safe squat mechanics to progress closed chain exercises within protocol while protecting ACL repair to improve strength and reduce risk for re-injury.     Time  8    Period  Weeks    Status  On-going      PT LONG TERM GOAL #4   Title  Patient will ambulate at = or > 1.2 m/s with no assistive device and no brace to return to PLOF with community ambulationa nd mobility at work.     Time  8    Period  Weeks    Status  On-going  PT LONG TERM GOAL #5   Title  Patient will (with MD clearance) report being able to walk for 2 miles on trails with no LOB and no knee pain to return to PLOF with hiking and hunting activities.     Time  8    Period  Weeks    Status  On-going            Plan - 01/22/18 1059    Clinical Impression Statement  Attempted single leg 1/4 squat but pt was unable to complete without pain once pt is able to complete  this pt may start lateral step ups.  Added vectors and wt to mat exercises to improve strength as well as leg press.     Rehab Potential  Good    PT Frequency  2x / week    PT Duration  8 weeks    PT Treatment/Interventions  ADLs/Self Care Home Management;Aquatic Therapy;Cryotherapy;Electrical Stimulation;Moist Heat;DME Instruction;Gait training;Stair training;Functional mobility training;Therapeutic activities;Therapeutic exercise;Balance training;Neuromuscular re-education;Patient/family education;Manual techniques;Passive range of motion;Taping    PT Next Visit Plan  Progress ROM exercises for Rt knee within phase II of Dr. Ruthe Mannan ACL protocol. Next session begin retro gait on treadmill, ).  Progress to biodex isokinetic 90-40 with high speeds at week 6.     PT Home Exercise Plan  Eval: heel slide, quad set, knee flexion in standing; 10/31;  SLR all planes; prone knee flexion and wall slides     Consulted and Agree with Plan of Care  Patient       Patient will benefit from skilled therapeutic intervention in order to improve the following deficits and impairments:  Abnormal gait, Decreased activity tolerance, Decreased endurance, Decreased range of motion, Decreased strength, Pain, Impaired flexibility, Difficulty walking, Decreased mobility, Decreased coordination, Decreased balance  Visit Diagnosis: Stiffness of right knee, not elsewhere classified  Localized edema  Muscle weakness (generalized)     Problem List Patient Active Problem List   Diagnosis Date Noted  . Rupture of anterior cruciate ligament of right knee   . S/P ACL repair right 12/25/17 05/01/2017  . Vasomotor flushing 08/30/2014  . Pain in joint, shoulder region 12/14/2013  . Muscle weakness (generalized) 12/14/2013  . Decreased range of motion of right shoulder 12/14/2013  Rayetta Humphrey, PT CLT [phone] 01/22/2018, 11:18 AM  [place], [place], [address] Phone: [phone]   Fax:  [phone]  Name: Natalie Hess MRN: [id number] Date of Birth: 07-07-1960

## 2018-01-27 ENCOUNTER — Encounter (HOSPITAL_COMMUNITY): Payer: Self-pay

## 2018-01-27 ENCOUNTER — Other Ambulatory Visit: Payer: Self-pay

## 2018-01-27 ENCOUNTER — Ambulatory Visit (HOSPITAL_COMMUNITY): Payer: 59

## 2018-01-27 DIAGNOSIS — M25661 Stiffness of right knee, not elsewhere classified: Secondary | ICD-10-CM | POA: Diagnosis not present

## 2018-01-27 DIAGNOSIS — M6281 Muscle weakness (generalized): Secondary | ICD-10-CM

## 2018-01-27 DIAGNOSIS — R600 Localized edema: Secondary | ICD-10-CM

## 2018-01-27 NOTE — Therapy (Signed)
[place], [place], [address] Phone: [phone]   Fax:  [phone]  Physical Therapy Treatment  Patient Details  Name: Natalie Hess MRN: [id number] Date of Birth: 08/14/1960 Referring Provider (PT): Arther Abbott   Encounter Date: 01/27/2018  PT End of Session - 01/27/18 1112    Visit Number  7    Number of Visits  17    Date for PT Re-Evaluation  03/04/18    Authorization Type  [place] [employer] (no visit limit, no auth required)    Authorization Time Period  01/07/2018 - 03/06/2018    Authorization - Visit Number  7    Authorization - Number of Visits  10    PT Start Time  6314    PT Stop Time  1114    PT Time Calculation (min)  39 min    Equipment Utilized During Treatment  Other (comment)   Bledso brace   Activity Tolerance  Patient tolerated treatment well    Behavior During Therapy  [facility] for tasks assessed/performed       Past Medical History:  Diagnosis Date  . Medical history non-contributory   . PONV (postoperative nausea and vomiting)    pt states the combination of zofran, decadron and scope patch worked well for her PONV history.    Past Surgical History:  Procedure Laterality Date  . ABDOMINAL HYSTERECTOMY    . ANTERIOR CRUCIATE LIGAMENT REPAIR Right 12/25/2017   Procedure: KNEE ARTHROSCOPY WITH ANTERIOR CRUCIATE LIGAMENT (ACL) REPAIR with Allograft Achilles,  medial and lateral meniscectomy;  Surgeon: Carole Civil, MD;  [facility]: [facility];  Service: Orthopedics;  Laterality: Right;  . CESAREAN SECTION     X 2  . COLONOSCOPY N/A 02/17/2015   Procedure: COLONOSCOPY;  Surgeon: Rogene Houston, MD;  [facility];  Service: Endoscopy;  Laterality: N/A;  930  . KNEE ARTHROSCOPY WITH MEDIAL MENISECTOMY Right 04/24/2017   Procedure: KNEE ARTHROSCOPY WITH PARTIAL MEDIAL MENISECTOMY; ACL DEBRIEDMENT;  Surgeon: Carole Civil, MD;  Location: [facility];  Service: Orthopedics;   Laterality: Right;    There were no vitals filed for this visit.  Subjective Assessment - 01/27/18 1037    Subjective  PT states that she has been walking about 15 minutes twice a day her calf is sore but her knee feels much better.     Limitations  Standing;Walking;House hold activities    Patient Stated Goals  to drive again and get back to work    Currently in Pain?  No/denies       [facility] Adult PT Treatment/Exercise - 01/27/18 0001      Exercises   Exercises  Knee/Hip      Knee/Hip Exercises: Aerobic   Elliptical  4 minutes for warm up/ROM    Tread Mill  retro gait on treadmill, 3 minutes at 1.0 mph      Knee/Hip Exercises: Machines for Strengthening   Other Machine  bodycraft leg press; 20 lbs (2 plates), 2x 15 reps      Knee/Hip Exercises: Standing   Heel Raises  Right;15 reps   Rt LE only   Forward Step Up  Right;15 reps;Hand Hold: 0;Step Height: 4";2 sets    SLS with Vectors  Rt LE only, 6 reps, 3 way vector, 3 sec holds each way    Other Standing Knee Exercises  Backwards step up: 2x 15 reps, 4"      Knee/Hip Exercises: Seated   Stool  Scoot - Round Trips  Hamstring stool scoot: 2x RT, 30'      Knee/Hip Exercises: Supine   Straight Leg Raises  Right;10 reps;1 set    Straight Leg Raises Limitations  3" holds      Knee/Hip Exercises: Prone   Hamstring Curl  15 reps;1 set   Rt LE   Hamstring Curl Limitations  7.5 lbs    Hip Extension  Right;1 set;15 reps;Limitations    Hip Extension Limitations  4 lbs        PT Education - 01/27/18 1112    Education Details  Continued to educate on exercises and educated on new CKC step ups for strengthening based on protocol.     Person(s) Educated  Patient    Methods  Explanation    Comprehension  Verbalized understanding       PT Short Term Goals - 01/15/18 1023      PT SHORT TERM GOAL #1   Title   Patient will be independent with HEP, updated PRN based on stage in protocol, to promote ACL repair healing and stability  of Rt knee to return to work activities and PLOF.     Time  2    Period  Weeks    Status  On-going      PT SHORT TERM GOAL #2   Title  Patient will achieve 0-120 Rt knee ROM up to WNL's or equal to Lt LE for improved mobility with gait and stairs and symmetrical mechanics.      Time  4    Period  Weeks    Status  On-going      PT SHORT TERM GOAL #3   Title  Patient will improve MMT by 1/2 grade for all limited groups to indicate significant improvement in functional LE strength    Time  4    Period  Weeks    Status  On-going      PT SHORT TERM GOAL #4   Status  On-going        PT Long Term Goals - 01/15/18 1023      PT LONG TERM GOAL #1   Title  Patient will improve MMT by 1 grade for all limited groups to indicate significant improvement in functional LE strength.     Time  8    Period  Weeks    Status  On-going      PT LONG TERM GOAL #2   Title  Patient will achieve full Rt knee ROM up to WNL's or equal to Lt LE for improved mobility with gait and stairs and symmetrical mechanics.      Time  8    Period  Weeks    Status  On-going      PT LONG TERM GOAL #3   Title  Patient will demonstrate safe squat mechanics to progress closed chain exercises within protocol while protecting ACL repair to improve strength and reduce risk for re-injury.     Time  8    Period  Weeks    Status  On-going      PT LONG TERM GOAL #4   Title  Patient will ambulate at = or > 1.2 m/s with no assistive device and no brace to return to PLOF with community ambulationa nd mobility at work.     Time  8    Period  Weeks    Status  On-going      PT LONG TERM GOAL #5   Title  Patient will (with MD clearance) report being able to walk for 2 miles on trails with no LOB and no knee pain to return to PLOF with hiking and hunting activities.     Time  8    Period  Weeks    Status  On-going        Plan - 01/27/18 1146    Clinical Impression Statement  Continued to progress patient through ACL  repair protocol. She is currently 4 weeks post-op. Initiated retro gait on treadmill and reverse step ups for hip extensor strengthening. Patient was also able to advance repetitions today without pain and demonstrated good knee stability with step ups and SLS balance training today. She will continue to benefit from skilled PT interventions to address impairments and safely progress through rehab protocol.    Rehab Potential  Good    PT Frequency  2x / week    PT Duration  8 weeks    PT Treatment/Interventions  ADLs/Self Care Home Management;Aquatic Therapy;Cryotherapy;Electrical Stimulation;Moist Heat;DME Instruction;Gait training;Stair training;Functional mobility training;Therapeutic activities;Therapeutic exercise;Balance training;Neuromuscular re-education;Patient/family education;Manual techniques;Passive range of motion;Taping    PT Next Visit Plan  Progress ROM exercises for Rt knee within phase II of Dr. Ruthe Mannan ACL protocol. Patient is 4 weeks post-op week of 01/26/18. Progress to biodex isokinetic 90-40 with high speeds at week 6.    PT Home Exercise Plan  Eval: heel slide, quad set, knee flexion in standing; 10/31;  SLR all planes; prone knee flexion and wall slides     Consulted and Agree with Plan of Care  Patient       Patient will benefit from skilled therapeutic intervention in order to improve the following deficits and impairments:  Abnormal gait, Decreased activity tolerance, Decreased endurance, Decreased range of motion, Decreased strength, Pain, Impaired flexibility, Difficulty walking, Decreased mobility, Decreased coordination, Decreased balance  Visit Diagnosis: Stiffness of right knee, not elsewhere classified  Localized edema  Muscle weakness (generalized)     Problem List Patient Active Problem List   Diagnosis Date Noted  . Rupture of anterior cruciate ligament of right knee   . S/P ACL repair right 12/25/17 05/01/2017  . Vasomotor flushing 08/30/2014  .  Pain in joint, shoulder region 12/14/2013  . Muscle weakness (generalized) 12/14/2013  . Decreased range of motion of right shoulder 12/14/2013    Kipp Brood, PT, DPT Physical Therapist with [facility]  01/27/2018 12:25 PM    [place], [place], [address] Phone: [phone]   Fax:  [phone]  Name: RUBYE STROHMEYER MRN: [id number] Date of Birth: 06-Oct-1960

## 2018-01-29 ENCOUNTER — Ambulatory Visit (HOSPITAL_COMMUNITY): Payer: 59

## 2018-01-29 ENCOUNTER — Encounter (HOSPITAL_COMMUNITY): Payer: Self-pay

## 2018-01-29 DIAGNOSIS — R600 Localized edema: Secondary | ICD-10-CM | POA: Diagnosis not present

## 2018-01-29 DIAGNOSIS — M25661 Stiffness of right knee, not elsewhere classified: Secondary | ICD-10-CM

## 2018-01-29 DIAGNOSIS — M6281 Muscle weakness (generalized): Secondary | ICD-10-CM

## 2018-01-29 NOTE — Therapy (Addendum)
[place], [place], [address] Phone: [phone]   Fax:  [phone]  Physical Therapy Treatment  Patient Details  Name: Natalie Hess MRN: [id number] Date of Birth: [birth year] Referring Provider (PT): Arther Abbott Progress Note Reporting Period 10/23 to 01/29/18  See note below for Objective Data and Assessment of Progress/Goals.   # OF FEET WALKED: ambulated no AD through session. ROM:  Flexion: 131 degrees (was 100 degrees 01/07/18)            Extension: 1 degrees (was 3 degrees 01/07/18)  Encounter Date: 01/29/2018  PT End of Session - 01/29/18 0949    Visit Number  8    Number of Visits  17    Date for PT Re-Evaluation  03/04/18    Authorization Type  [place] [employer] (no visit limit, no auth required)    Authorization Time Period  01/07/2018 - 03/06/2018    Authorization - Visit Number  8    Authorization - Number of Visits  10    PT Start Time  [phone]   3' on elliptical, not included with charges   PT Stop Time  1031    PT Time Calculation (min)  44 min    Equipment Utilized During Treatment  Other (comment)   Bledso brace   Activity Tolerance  Patient tolerated treatment well    Behavior During Therapy  WFL for tasks assessed/performed       Past Medical History:  Diagnosis Date  . Medical history non-contributory   . PONV (postoperative nausea and vomiting)    pt states the combination of zofran, decadron and scope patch worked well for her PONV history.    Past Surgical History:  Procedure Laterality Date  . ABDOMINAL HYSTERECTOMY    . ANTERIOR CRUCIATE LIGAMENT REPAIR Right 12/25/2017   Procedure: KNEE ARTHROSCOPY WITH ANTERIOR CRUCIATE LIGAMENT (ACL) REPAIR with Allograft Achilles,  medial and lateral meniscectomy;  Surgeon: Carole Civil, MD;  Location: [facility];  Service: Orthopedics;  Laterality: Right;  . CESAREAN SECTION     X 2  . COLONOSCOPY N/A 02/17/2015   Procedure:  COLONOSCOPY;  Surgeon: Rogene Houston, MD;  Location: [facility];  Service: Endoscopy;  Laterality: N/A;  930  . KNEE ARTHROSCOPY WITH MEDIAL MENISECTOMY Right 04/24/2017   Procedure: KNEE ARTHROSCOPY WITH PARTIAL MEDIAL MENISECTOMY; ACL DEBRIEDMENT;  Surgeon: Carole Civil, MD;  Location: [facility];  Service: Orthopedics;  Laterality: Right;    There were no vitals filed for this visit.  Subjective Assessment - 01/29/18 0948    Subjective  Pt stated she is feelling good today, no reports of pain today.  Goes to see MD tomorrow.      Patient Stated Goals  to drive again and get back to work    Currently in Pain?  No/denies         [facility] PT Assessment - 01/29/18 0001      Assessment   Medical Diagnosis  Rt ACL Repair    Referring Provider (PT)  Arther Abbott    Onset Date/Surgical Date  12/25/17    Next MD Visit  01/30/18    Prior Therapy  yes for Rt knee pain      Precautions   Precautions  Knee    Precaution Comments  ACL Repair      Circumferential Edema   Circumferential - Right  14.75" at joint line   was 15.75" at joint line  Circumferential - Left   14.75" at joint line   was 15"     Functional Tests   Functional tests  Single leg stance      Single Leg Stance   Comments  Rt 60" on 01/22/18      ROM / Strength   AROM / PROM / Strength  AROM;Strength      AROM   AROM Assessment Site  Knee    Right/Left Knee  Right;Left    Right Knee Extension  1   was 3   Right Knee Flexion  131   was 100   Left Knee Extension  0    Left Knee Flexion  150      Strength   Strength Assessment Site  Hip;Knee;Ankle    Right Hip Flexion  4+/5   was 4+/5   Right Hip Extension  4/5   was 4/5   Right Hip ABduction  4/5   was 4/5   Left Hip Flexion  4+/5   was 4+/5   Left Hip Extension  4/5   was 4/5   Left Hip ABduction  4+/5   was 4/5   Right/Left Knee  Right;Left    Right Knee Flexion  4+/5   was 4+/8   Right Knee Extension  --   not tested on 01/29/18    Left Knee Flexion  5/5   was 4+/5     Ambulation/Gait   Ambulation/Gait  Yes    Ambulation/Gait Assistance  7: Independent    Ambulation Distance (Feet)  668 Feet   2MWT was 424   Assistive device  None    Ambulation Surface  Level;Indoor                   [facility] Adult PT Treatment/Exercise - 01/29/18 0001      Ambulation/Gait   Gait velocity  1.69 m/s   1.07 m/s     Exercises   Exercises  Knee/Hip      Knee/Hip Exercises: Aerobic   Elliptical  3 minutes for warm up/ROM    Tread Mill  retro gait on treadmill,  minutes at 1.5 mph      Knee/Hip Exercises: Machines for Strengthening   Other Machine  bodycraft leg press; 30 lbs (2 plates), 2x 15 reps      Knee/Hip Exercises: Standing   Heel Raises  Right;20 reps   Rt only   Heel Raises Limitations  toe raises on slope    Forward Step Up  Right;Hand Hold: 0;15 reps;Step Height: 6"    Functional Squat  10 reps    Functional Squat Limitations  minisquat front of chair    Wall Squat  10 reps    Wall Squat Limitations   45 degrees x 15 seconds     SLS with Vectors  Rt LE only, 6 reps, 3 way vector, 5 sec holds each way    Other Standing Knee Exercises  Backwards step up: 15 reps, 6" 1 HHA      Knee/Hip Exercises: Seated   Stool Scoot - Round Trips  Hamstring stool scoot: 2x RT, 30'               PT Short Term Goals - 01/29/18 1017      PT SHORT TERM GOAL #1   Title   Patient will be independent with HEP, updated PRN based on stage in protocol, to promote ACL repair healing and stability of Rt knee to return to work activities  and PLOF.     Baseline  01/29/2018:  Reports compliance iwth HEP multiple times a day    Time  2    Period  Weeks    Status  Achieved      PT SHORT TERM GOAL #2   Title  Patient will achieve 0-120 Rt knee ROM up to WNL's or equal to Lt LE for improved mobility with gait and stairs and symmetrical mechanics.      Baseline  01/29/2018: 1-131 degrees (was 3-100 degrees)     Status  Partially Met      PT SHORT TERM GOAL #3   Title  Patient will improve MMT by 1/2 grade for all limited groups to indicate significant improvement in functional LE strength    Baseline  11/14: see MMT    Time  4    Period  Weeks    Status  On-going      PT SHORT TERM GOAL #4   Title  Pt will have decreased joint line edema of RLE by 1" to be symmetrical with LLE in order to maximize ROM.    Baseline  11/14: joint line at 14.75 was 15.75" initial eval    Status  Achieved        PT Long Term Goals - 01/29/18 1251      PT LONG TERM GOAL #1   Title  Patient will improve MMT by 1 grade for all limited groups to indicate significant improvement in functional LE strength.     Status  On-going      PT LONG TERM GOAL #2   Title  Patient will achieve full Rt knee ROM up to WNL's or equal to Lt LE for improved mobility with gait and stairs and symmetrical mechanics.      Baseline  11/14: 1-131 degrees (was 3-100 degree)      PT LONG TERM GOAL #3   Title  Patient will demonstrate safe squat mechanics to progress closed chain exercises within protocol while protecting ACL repair to improve strength and reduce risk for re-injury.     Status  On-going      PT LONG TERM GOAL #4   Title  Patient will ambulate at = or > 1.2 m/s with no assistive device and no brace to return to PLOF with community ambulationa nd mobility at work.     Baseline  11/14:  gait speed at 1.69 m/s no AD (was 1.07 m/s)    Status  Achieved      PT LONG TERM GOAL #5   Title  Patient will (with MD clearance) report being able to walk for 2 miles on trails with no LOB and no knee pain to return to PLOF with hiking and hunting activities.     Baseline  Not cleared by MD yet            Plan - 01/29/18 1244    Clinical Impression Statement  Pt at 5 week post-op today, continues session foucs on LE strengthening per ACL protocol.  Pt reports MD apt tomorrow so reviewed goals with vast improvements noted with  knee mobility 1-131 degrees (was 3-100 degree) , improved gait mechanics wiht no AD and increased gait speed to 1.69 m/s, minimal edema present joint line.  Pt continues to show deficits with strengthening though is making small gains.  Able to increase height with step up training as well as weight with leg press for functional strengthening.  No reports of pain through session.  Rehab Potential  Good    PT Frequency  2x / week    PT Duration  8 weeks    PT Treatment/Interventions  ADLs/Self Care Home Management;Aquatic Therapy;Cryotherapy;Electrical Stimulation;Moist Heat;DME Instruction;Gait training;Stair training;Functional mobility training;Therapeutic activities;Therapeutic exercise;Balance training;Neuromuscular re-education;Patient/family education;Manual techniques;Passive range of motion;Taping    PT Next Visit Plan  Progress ROM exercises for Rt knee within phase II of Dr. Ruthe Mannan ACL protocol. Patient is 5 weeks post-op week of 01/26/18. Progress to biodex isokinetic 90-40 with high speeds at week 6.    PT Home Exercise Plan  Eval: heel slide, quad set, knee flexion in standing; 10/31;  SLR all planes; prone knee flexion and wall slides        Patient will benefit from skilled therapeutic intervention in order to improve the following deficits and impairments:  Abnormal gait, Decreased activity tolerance, Decreased endurance, Decreased range of motion, Decreased strength, Pain, Impaired flexibility, Difficulty walking, Decreased mobility, Decreased coordination, Decreased balance  Visit Diagnosis: Stiffness of right knee, not elsewhere classified  Localized edema  Muscle weakness (generalized)     Problem List Patient Active Problem List   Diagnosis Date Noted  . Rupture of anterior cruciate ligament of right knee   . S/P ACL repair right 12/25/17 05/01/2017  . Vasomotor flushing 08/30/2014  . Pain in joint, shoulder region 12/14/2013  . Muscle weakness (generalized)  12/14/2013  . Decreased range of motion of right shoulder 12/14/2013   Ihor Austin, LPTA; [place]  Aldona Lento 01/29/2018, 12:55 PM  [place], [place], [address] Phone: [phone]   Fax:  [phone]  Name: Natalie Hess MRN: [id number] Date of Birth: August 31, 1960

## 2018-01-30 ENCOUNTER — Ambulatory Visit (INDEPENDENT_AMBULATORY_CARE_PROVIDER_SITE_OTHER): Payer: 59 | Admitting: Orthopedic Surgery

## 2018-01-30 ENCOUNTER — Encounter: Payer: Self-pay | Admitting: Orthopedic Surgery

## 2018-01-30 VITALS — BP 153/94 | HR 83 | Ht 66.0 in | Wt 148.0 lb

## 2018-01-30 DIAGNOSIS — M25552 Pain in left hip: Secondary | ICD-10-CM

## 2018-01-30 DIAGNOSIS — Z9889 Other specified postprocedural states: Secondary | ICD-10-CM

## 2018-01-30 DIAGNOSIS — G8929 Other chronic pain: Secondary | ICD-10-CM | POA: Diagnosis not present

## 2018-01-30 MED ORDER — METHYLPREDNISOLONE ACETATE 40 MG/ML IJ SUSP
40.0000 mg | Freq: Once | INTRAMUSCULAR | Status: AC
  Administered 2018-01-30: 40 mg via INTRAMUSCULAR

## 2018-01-30 NOTE — Progress Notes (Signed)
[facility]   Encounter Diagnosis  Name Primary?  . S/P ACL repair right 12/25/17 Yes    Chief Complaint  Patient presents with  . Knee Pain    Right knee DOS 12/25/17    POD # 36 (5 WEEKS)  Natalie Hess is doing well she has full extension she is regained full flexion she has a great Lockman test no swelling in the knees she is ambulating with her brace on  We will get a switch braces to economy hinged she will go back to work December 2 she will continue therapy follow-up in 6 weeks  She is complaining of increased pain in the posterior aspect of her left hip near the greater trochanter and piriformis insertion her hip flexion tests were normal.  She had no rotatory pain no groin pain no thigh pain she had palpable tenderness over the greater trochanter and piriformis insertion  She has been using some ibuprofen and and heat but noticed increased symptoms since she had her surgery  We recommend an intramuscular injection of 40 mg Depo-Medrol.

## 2018-01-30 NOTE — Addendum Note (Signed)
Addended byCandice Camp on: 01/30/2018 11:46 AM   Modules accepted: Orders

## 2018-02-03 ENCOUNTER — Encounter (HOSPITAL_COMMUNITY): Payer: Self-pay

## 2018-02-03 ENCOUNTER — Other Ambulatory Visit: Payer: Self-pay

## 2018-02-03 ENCOUNTER — Ambulatory Visit (HOSPITAL_COMMUNITY): Payer: 59

## 2018-02-03 DIAGNOSIS — M25661 Stiffness of right knee, not elsewhere classified: Secondary | ICD-10-CM

## 2018-02-03 DIAGNOSIS — R600 Localized edema: Secondary | ICD-10-CM | POA: Diagnosis not present

## 2018-02-03 DIAGNOSIS — M6281 Muscle weakness (generalized): Secondary | ICD-10-CM

## 2018-02-03 NOTE — Therapy (Signed)
[place], [place], [address] Phone: [phone]   Fax:  [phone]  Physical Therapy Treatment  Patient Details  Name: Natalie Hess MRN: [id number] Date of Birth: 31-Dec-1960 Referring Provider (PT): Arther Abbott   Encounter Date: 02/03/2018  PT End of Session - 02/03/18 1046    Visit Number  9    Number of Visits  17    Date for PT Re-Evaluation  03/04/18    Authorization Type  [place] [employer] (no visit limit, no auth required)    Authorization Time Period  01/07/2018 - 03/06/2018    Authorization - Visit Number  9    Authorization - Number of Visits  10    PT Start Time  1030    PT Stop Time  1114    PT Time Calculation (min)  44 min    Equipment Utilized During Treatment  Other (comment)   Bledso brace   Activity Tolerance  Patient tolerated treatment well    Behavior During Therapy  [facility] for tasks assessed/performed       Past Medical History:  Diagnosis Date  . Medical history non-contributory   . PONV (postoperative nausea and vomiting)    pt states the combination of zofran, decadron and scope patch worked well for her PONV history.    Past Surgical History:  Procedure Laterality Date  . ABDOMINAL HYSTERECTOMY    . ANTERIOR CRUCIATE LIGAMENT REPAIR Right 12/25/2017   Procedure: KNEE ARTHROSCOPY WITH ANTERIOR CRUCIATE LIGAMENT (ACL) REPAIR with Allograft Achilles,  medial and lateral meniscectomy;  Surgeon: Carole Civil, MD;  Location: [facility];  Service: Orthopedics;  Laterality: Right;  . CESAREAN SECTION     X 2  . COLONOSCOPY N/A 02/17/2015   Procedure: COLONOSCOPY;  Surgeon: Rogene Houston, MD;  [facility]: [facility];  Service: Endoscopy;  Laterality: N/A;  930  . KNEE ARTHROSCOPY WITH MEDIAL MENISECTOMY Right 04/24/2017   Procedure: KNEE ARTHROSCOPY WITH PARTIAL MEDIAL MENISECTOMY; ACL DEBRIEDMENT;  Surgeon: Carole Civil, MD;  Location: [facility];  Service: Orthopedics;   Laterality: Right;    There were no vitals filed for this visit.  Subjective Assessment - 02/03/18 1035    Subjective  Patient denies pain today and reports her MD visit went well Friday. He told her she could switch to the smaller brace and that she can return to work on december 2nd. She has also been having Lt hip pain and Dr. Aline Brochure gave her an injection last Friday.     Limitations  Standing;Walking;House hold activities    Patient Stated Goals  to drive again and get back to work    Currently in Pain?  No/denies       [facility] Adult PT Treatment/Exercise - 02/03/18 0001      Exercises   Exercises  Knee/Hip      Knee/Hip Exercises: Aerobic   Elliptical  4 minutes for warm up/ROM    Tread Mill  retro gait on treadmill,  minutes at 1.3 mph      Knee/Hip Exercises: Machines for Strengthening   Cybex Leg Press  Bodycraft Leg Press: 2x 15 reps 4 plates (12XNT)    Other Machine  Biodex: isokinetic ROM from 90-40 (180, 150, 120), 2x 10 reps each       Knee/Hip Exercises: Standing   Heel Raises  Right;20 reps   on incline   Heel Raises Limitations  toe raises, 20 reps, on decline  Forward Step Up  2 sets;15 reps;Hand Hold: 0;Step Height: 6"    Step Down  Right;2 sets;10 reps;Step Height: 2";Hand Hold: 1    Wall Squat  15 reps;10 seconds    Wall Squat Limitations  mini-squat - 45 degrees    Other Standing Knee Exercises  Backwards step up: 2x 15 reps, 6", no UE support      Knee/Hip Exercises: Seated   Stool Scoot - Round Trips  Hamstring stool scoot: 1x RT, 30'        PT Education - 02/03/18 1045    Education Details  Educated on exercises throughout and on ACL rehab protocol to protect repair and prevent re-injury.    Person(s) Educated  Patient    Methods  Explanation    Comprehension  Verbalized understanding;Returned demonstration       PT Short Term Goals - 01/29/18 0952      PT SHORT TERM GOAL #1   Title   Patient will be independent with HEP, updated PRN based  on stage in protocol, to promote ACL repair healing and stability of Rt knee to return to work activities and PLOF.     Baseline  01/29/2018:  Reports compliance iwth HEP multiple times a day    Time  2    Period  Weeks    Status  Achieved      PT SHORT TERM GOAL #2   Title  Patient will achieve 0-120 Rt knee ROM up to WNL's or equal to Lt LE for improved mobility with gait and stairs and symmetrical mechanics.      Baseline  01/29/2018: 1-131 degrees (was 3-100 degrees)    Status  Partially Met      PT SHORT TERM GOAL #3   Title  Patient will improve MMT by 1/2 grade for all limited groups to indicate significant improvement in functional LE strength    Baseline  11/14: see MMT    Time  4    Period  Weeks    Status  On-going      PT SHORT TERM GOAL #4   Title  Pt will have decreased joint line edema of RLE by 1" to be symmetrical with LLE in order to maximize ROM.    Baseline  11/14: joint line at 14.75 was 15.75" initial eval    Status  Achieved        PT Long Term Goals - 01/29/18 1251      PT LONG TERM GOAL #1   Title  Patient will improve MMT by 1 grade for all limited groups to indicate significant improvement in functional LE strength.     Status  On-going      PT LONG TERM GOAL #2   Title  Patient will achieve full Rt knee ROM up to WNL's or equal to Lt LE for improved mobility with gait and stairs and symmetrical mechanics.      Baseline  11/14: 1-131 degrees (was 3-100 degree)      PT LONG TERM GOAL #3   Title  Patient will demonstrate safe squat mechanics to progress closed chain exercises within protocol while protecting ACL repair to improve strength and reduce risk for re-injury.     Status  On-going      PT LONG TERM GOAL #4   Title  Patient will ambulate at = or > 1.2 m/s with no assistive device and no brace to return to PLOF with community ambulationa nd mobility at work.  Baseline  11/14:  gait speed at 1.69 m/s no AD (was 1.07 m/s)    Status  Achieved       PT LONG TERM GOAL #5   Title  Patient will (with MD clearance) report being able to walk for 2 miles on trails with no LOB and no knee pain to return to PLOF with hiking and hunting activities.     Baseline  Not cleared by MD yet         Plan - 02/03/18 1047    Clinical Impression Statement  Patient arrived with small knee brace. She reports she has been cleared to return to work on 02/16/18 and that Dr. Aline Brochure was pleased with her ROM. She is 6 weeks post-op the week of 02/02/18 and initiated isokinetic strengthening form 90-40 degrees today. She denied pain with progression of exercises and advanced eccentric quad strengthening as well. She required single UE support to maintain balance and good knee stability during step down this session and reported quad fatigue but no pain. Patient is progressing well through ACL protocol and will continue to benefit from skilled PT interventions to protect ACL repair and progress mobility safely.    Rehab Potential  Good    PT Frequency  2x / week    PT Duration  8 weeks    PT Treatment/Interventions  ADLs/Self Care Home Management;Aquatic Therapy;Cryotherapy;Electrical Stimulation;Moist Heat;DME Instruction;Gait training;Stair training;Functional mobility training;Therapeutic activities;Therapeutic exercise;Balance training;Neuromuscular re-education;Patient/family education;Manual techniques;Passive range of motion;Taping    PT Next Visit Plan  Progress ROM exercises for Rt knee within phase II of Dr. Ruthe Mannan ACL protocol. Patient is 6 weeks post-op week of 02/02/18. Progress to biodex isokinetic 90-40 with high speeds at week 6. Continue with step down and add single limb mini-squat next session.    PT Home Exercise Plan  Eval: heel slide, quad set, knee flexion in standing; 10/31;  SLR all planes; prone knee flexion and wall slides     Consulted and Agree with Plan of Care  Patient       Patient will benefit from skilled therapeutic  intervention in order to improve the following deficits and impairments:  Abnormal gait, Decreased activity tolerance, Decreased endurance, Decreased range of motion, Decreased strength, Pain, Impaired flexibility, Difficulty walking, Decreased mobility, Decreased coordination, Decreased balance  Visit Diagnosis: Stiffness of right knee, not elsewhere classified  Localized edema  Muscle weakness (generalized)     Problem List Patient Active Problem List   Diagnosis Date Noted  . Rupture of anterior cruciate ligament of right knee   . S/P ACL repair right 12/25/17 05/01/2017  . Vasomotor flushing 08/30/2014  . Pain in joint, shoulder region 12/14/2013  . Muscle weakness (generalized) 12/14/2013  . Decreased range of motion of right shoulder 12/14/2013    Kipp Brood, PT, DPT Physical Therapist with [facility]  02/03/2018 11:19 AM    [place], [place], [address] Phone: [phone]   Fax:  [phone]  Name: Natalie Hess MRN: [id number] Date of Birth: 02/24/1961

## 2018-02-05 ENCOUNTER — Other Ambulatory Visit: Payer: Self-pay

## 2018-02-05 ENCOUNTER — Encounter (HOSPITAL_COMMUNITY): Payer: Self-pay

## 2018-02-05 ENCOUNTER — Ambulatory Visit (HOSPITAL_COMMUNITY): Payer: 59

## 2018-02-05 DIAGNOSIS — M25661 Stiffness of right knee, not elsewhere classified: Secondary | ICD-10-CM

## 2018-02-05 DIAGNOSIS — R600 Localized edema: Secondary | ICD-10-CM | POA: Diagnosis not present

## 2018-02-05 DIAGNOSIS — M6281 Muscle weakness (generalized): Secondary | ICD-10-CM | POA: Diagnosis not present

## 2018-02-05 NOTE — Therapy (Signed)
[place], [place], [address] Phone: [phone]   Fax:  [phone]  Physical Therapy Treatment/[place] Note  Patient Details  Name: Natalie Hess MRN: [id number] Date of Birth: [birth year] Referring Provider (PT): Arther Abbott   Encounter Date: 02/05/2018   Progress Note Reporting Period 01/07/2018 to 02/05/2018  See note below for Objective Data and Assessment of Progress/Goals.    PT End of Session - 02/05/18 1055    Visit Number  10    Number of Visits  17    Date for PT Re-Evaluation  03/04/18    Authorization Type  [place] [employer] (no visit limit, no auth required)    Authorization Time Period  01/07/2018 - 03/06/2018    Authorization - Visit Number  1    Authorization - Number of Visits  10    PT Start Time  9767    PT Stop Time  1115    PT Time Calculation (min)  40 min    Equipment Utilized During Treatment  Other (comment)   Bledso brace   Activity Tolerance  Patient tolerated treatment well    Behavior During Therapy  WFL for tasks assessed/performed       Past Medical History:  Diagnosis Date  . Medical history non-contributory   . PONV (postoperative nausea and vomiting)    pt states the combination of zofran, decadron and scope patch worked well for her PONV history.    Past Surgical History:  Procedure Laterality Date  . ABDOMINAL HYSTERECTOMY    . ANTERIOR CRUCIATE LIGAMENT REPAIR Right 12/25/2017   Procedure: KNEE ARTHROSCOPY WITH ANTERIOR CRUCIATE LIGAMENT (ACL) REPAIR with Allograft Achilles,  medial and lateral meniscectomy;  Surgeon: Carole Civil, MD;  Location: [facility];  Service: Orthopedics;  Laterality: Right;  . CESAREAN SECTION     X 2  . COLONOSCOPY N/A 02/17/2015   Procedure: COLONOSCOPY;  Surgeon: Rogene Houston, MD;  Location: [facility];  Service: Endoscopy;  Laterality: N/A;  930  . KNEE ARTHROSCOPY WITH MEDIAL MENISECTOMY Right 04/24/2017   Procedure:  KNEE ARTHROSCOPY WITH PARTIAL MEDIAL MENISECTOMY; ACL DEBRIEDMENT;  Surgeon: Carole Civil, MD;  Location: [facility];  Service: Orthopedics;  Laterality: Right;    There were no vitals filed for this visit.  Subjective Assessment - 02/05/18 1041    Subjective  Patient reports she is doing well today and denies pain. She states she went hunting yesterday but did not get anything. She states her husband set a blind up close to her house for her to walk to.     Limitations  Standing;Walking;House hold activities    Patient Stated Goals  to drive again and get back to work    Currently in Pain?  No/denies         [facility] PT Assessment - 02/05/18 0001      Assessment   Medical Diagnosis  Rt ACL Repair    Referring Provider (PT)  Arther Abbott    Onset Date/Surgical Date  12/25/17    Next MD Visit  03/01/18    Prior Therapy  yes for Rt knee pain      Precautions   Precautions  Knee    Precaution Comments  ACL Repair      Restrictions   Weight Bearing Restrictions  No      AROM   Right Knee Extension  0    Right Knee Flexion  130  Strength   Right Hip Flexion  5/5   4+   Right Hip Extension  4+/5   4   Right Hip ABduction  4/5   4   Left Hip Flexion  5/5   4+   Left Hip Extension  4+/5   4   Left Hip ABduction  4/5   4   Right Knee Flexion  4+/5   4+   Right Knee Extension  4/5   not tested at eval   Left Knee Flexion  5/5   4+   Left Knee Extension  5/5   5   Right Ankle Dorsiflexion  5/5   5   Right Ankle Plantar Flexion  4+/5   4   Left Ankle Dorsiflexion  5/5   5   Left Ankle Plantar Flexion  5/5   5     Ambulation/Gait   Gait velocity  --   1.69 m/s on 01/29/18      [facility] Adult PT Treatment/Exercise - 02/05/18 0001      Exercises   Exercises  Knee/Hip      Knee/Hip Exercises: Aerobic   Elliptical  4 minutes for warm up/ROM      Knee/Hip Exercises: Machines for Strengthening   Cybex Knee Flexion  Cybex: hamstring curl, 2x 10 reps, 2  paltes (18lbs)    Cybex Leg Press  Bodycraft Leg Press: 2x 15 reps 2 plates (60AVW), Rt LE only    Other Machine  Biodex: isokinetic ROM from 90-40 (180, 150, 120), 2x 10 reps each       Knee/Hip Exercises: Standing   Heel Raises  Right;20 reps    Heel Raises Limitations  toe raises, 20 reps, on decline    Forward Step Up  2 sets;20 reps;Hand Hold: 0;Step Height: 6"    Other Standing Knee Exercises  Backwards step up: 2x 20 reps, 6", no UE support    Other Standing Knee Exercises  Hip Hike: 1x 10 reps Bil LE        PT Education - 02/05/18 1115    Education Details  Educated on overall progress towards goals and on new exercises this session. Discussed need for further appointments to continue progressing thorugh rehab protocol.    Person(s) Educated  Patient    Methods  Explanation    Comprehension  Verbalized understanding       PT Short Term Goals - 02/05/18 1055      PT SHORT TERM GOAL #1   Title   Patient will be independent with HEP, updated PRN based on stage in protocol, to promote ACL repair healing and stability of Rt knee to return to work activities and PLOF.     Baseline  01/29/2018:  Reports compliance iwth HEP multiple times a day    Time  2    Period  Weeks    Status  Achieved      PT SHORT TERM GOAL #2   Title  Patient will achieve 0-120 Rt knee ROM up to WNL's or equal to Lt LE for improved mobility with gait and stairs and symmetrical mechanics.      Baseline  --    Time  4    Period  Weeks    Status  Achieved      PT SHORT TERM GOAL #3   Title  Patient will improve MMT by 1/2 grade for all limited groups to indicate significant improvement in functional LE strength    Baseline  --  Time  4    Period  Weeks    Status  Partially Met      PT SHORT TERM GOAL #4   Title  --    Baseline  --    Status  --        PT Long Term Goals - 02/05/18 1056      PT LONG TERM GOAL #1   Title  Patient will improve MMT by 1 grade for all limited groups to  indicate significant improvement in functional LE strength.     Time  8    Period  Weeks    Status  Partially Met      PT LONG TERM GOAL #2   Title  Patient will achieve full Rt knee ROM up to WNL's or equal to Lt LE for improved mobility with gait and stairs and symmetrical mechanics.      Baseline  --    Time  8    Period  Weeks    Status  On-going      PT LONG TERM GOAL #3   Title  Patient will demonstrate safe squat mechanics to progress closed chain exercises within protocol while protecting ACL repair to improve strength and reduce risk for re-injury.     Time  8    Period  Weeks    Status  On-going      PT LONG TERM GOAL #4   Title  Patient will ambulate at = or > 1.2 m/s with no assistive device and no brace to return to PLOF with community ambulationa nd mobility at work.     Baseline  --    Time  8    Period  Weeks    Status  On-going      PT LONG TERM GOAL #5   Title  Patient will (with MD clearance) report being able to walk for 2 miles on trails with no LOB and no knee pain to return to PLOF with hiking and hunting activities.     Baseline  Not cleared by MD yet    Time  8    Period  Weeks    Status  On-going         Plan - 02/05/18 1100    Clinical Impression Statement  Re-assessed patient's progress with LE strength and ROM today. She continues to improve LE strength throughout with exception to hip extensor/abductor testing. She has also reported some left hip pain over the last week and reported glut med exercises recreated her pain today indicating it is likely due to muscle overuse or weakness. She has good ROM from 0-130 degrees on Rt knee but this remains limited compared to Lt knee mobility. Ms. Facey was able to progress today with hamstring curl on weighted machine and denied pain throughout session. She will continue to benefit form skilled PT interventions to address impairments and progress through protocol safely as she returns to work.     Rehab  Potential  Good    PT Frequency  2x / week    PT Duration  8 weeks    PT Treatment/Interventions  ADLs/Self Care Home Management;Aquatic Therapy;Cryotherapy;Electrical Stimulation;Moist Heat;DME Instruction;Gait training;Stair training;Functional mobility training;Therapeutic activities;Therapeutic exercise;Balance training;Neuromuscular re-education;Patient/family education;Manual techniques;Passive range of motion;Taping    PT Next Visit Plan   Progress ROM exercises for Rt knee within phase II of Dr. Ruthe Mannan ACL protocol. Patient is 6 weeks post-op week of 02/02/18. Progress to biodex isokinetic 90-40 with high speeds at week 6.  Continue with step down and add single limb mini-squat next session. Follow up on hip hike and add glute med strengthening throughout the next week.    PT Home Exercise Plan  Eval: heel slide, quad set, knee flexion in standing; 10/31;  SLR all planes; prone knee flexion and wall slides     Consulted and Agree with Plan of Care  Patient       Patient will benefit from skilled therapeutic intervention in order to improve the following deficits and impairments:  Abnormal gait, Decreased activity tolerance, Decreased endurance, Decreased range of motion, Decreased strength, Pain, Impaired flexibility, Difficulty walking, Decreased mobility, Decreased coordination, Decreased balance  Visit Diagnosis: Stiffness of right knee, not elsewhere classified  Localized edema  Muscle weakness (generalized)     Problem List Patient Active Problem List   Diagnosis Date Noted  . Rupture of anterior cruciate ligament of right knee   . S/P ACL repair right 12/25/17 05/01/2017  . Vasomotor flushing 08/30/2014  . Pain in joint, shoulder region 12/14/2013  . Muscle weakness (generalized) 12/14/2013  . Decreased range of motion of right shoulder 12/14/2013    Kipp Brood, PT, DPT Physical Therapist with [facility]  02/05/2018 12:26  PM    [place] [address] [place], [place], [address] Phone: [phone]   Fax:  [phone]  Name: Natalie Hess MRN: [id number] Date of Birth: 14-Jan-1961

## 2018-02-09 ENCOUNTER — Other Ambulatory Visit: Payer: Self-pay

## 2018-02-09 ENCOUNTER — Ambulatory Visit (HOSPITAL_COMMUNITY): Payer: 59

## 2018-02-09 ENCOUNTER — Encounter (HOSPITAL_COMMUNITY): Payer: Self-pay

## 2018-02-09 DIAGNOSIS — M6281 Muscle weakness (generalized): Secondary | ICD-10-CM | POA: Diagnosis not present

## 2018-02-09 DIAGNOSIS — M25661 Stiffness of right knee, not elsewhere classified: Secondary | ICD-10-CM

## 2018-02-09 DIAGNOSIS — R600 Localized edema: Secondary | ICD-10-CM | POA: Diagnosis not present

## 2018-02-09 NOTE — Therapy (Signed)
[place], [place], [address] Phone: [phone]   Fax:  [phone]  Physical Therapy Treatment  Patient Details  Name: Natalie Hess MRN: [id number] Date of Birth: [birth year] Referring Provider (PT): Arther Abbott   Encounter Date: 02/09/2018  PT End of Session - 02/09/18 0952    Visit Number  11    Number of Visits  17    Date for PT Re-Evaluation  03/04/18    Authorization Type  [place] [employer] (no visit limit, no auth required)    Authorization Time Period  01/07/2018 - 03/06/2018    Authorization - Visit Number  2    Authorization - Number of Visits  10    PT Start Time  0948    PT Stop Time  1030    PT Time Calculation (min)  42 min    Equipment Utilized During Treatment  Other (comment)   Bledso brace   Activity Tolerance  Patient tolerated treatment well    Behavior During Therapy  [facility] for tasks assessed/performed       Past Medical History:  Diagnosis Date  . Medical history non-contributory   . PONV (postoperative nausea and vomiting)    pt states the combination of zofran, decadron and scope patch worked well for her PONV history.    Past Surgical History:  Procedure Laterality Date  . ABDOMINAL HYSTERECTOMY    . ANTERIOR CRUCIATE LIGAMENT REPAIR Right 12/25/2017   Procedure: KNEE ARTHROSCOPY WITH ANTERIOR CRUCIATE LIGAMENT (ACL) REPAIR with Allograft Achilles,  medial and lateral meniscectomy;  Surgeon: Carole Civil, MD;  Location: [facility];  Service: Orthopedics;  Laterality: Right;  . CESAREAN SECTION     X 2  . COLONOSCOPY N/A 02/17/2015   Procedure: COLONOSCOPY;  Surgeon: Rogene Houston, MD;  Location: [facility];  Service: Endoscopy;  Laterality: N/A;  930  . KNEE ARTHROSCOPY WITH MEDIAL MENISECTOMY Right 04/24/2017   Procedure: KNEE ARTHROSCOPY WITH PARTIAL MEDIAL MENISECTOMY; ACL DEBRIEDMENT;  Surgeon: Carole Civil, MD;  Location: [facility];  Service: Orthopedics;   Laterality: Right;    There were no vitals filed for this visit.  Subjective Assessment - 02/09/18 0949    Subjective  Patient arrives with no pain and reports she is doing well. She is nervous and eager to return to work next week. She reports she is still walking outside to improce her exercise and endurance in preparation for returning to work.     Limitations  Standing;Walking;House hold activities    Patient Stated Goals  to drive again and get back to work    Currently in Pain?  No/denies       [facility] Adult PT Treatment/Exercise - 02/09/18 0001      Exercises   Exercises  Knee/Hip      Knee/Hip Exercises: Stretches   Active Hamstring Stretch  Right;3 reps;30 seconds;Limitations    Active Hamstring Stretch Limitations  12" box    Knee: Self-Stretch to increase Flexion  Left;3 reps;30 seconds;2 reps      Knee/Hip Exercises: Aerobic   Elliptical  4 minutes for warm up/ROM      Knee/Hip Exercises: Machines for Strengthening   Cybex Knee Flexion  Cybex: hamstring curl, 2x 10 reps, 2 paltes (18lbs)    Other Machine  Biodex: isokinetic ROM from 90-40 (165, 135, 105), 2x 10 reps each       Knee/Hip Exercises: Standing   Heel Raises  Right;20 reps  Heel Raises Limitations  toe raises, 20 reps, on decline    Lateral Step Up  Right;1 set;20 reps;Step Height: 6"    Forward Step Up  20 reps;Hand Hold: 0;Step Height: 6";1 set    Step Down  Right;10 reps;2 sets;Hand Hold: 2;Step Height: 6"    Other Standing Knee Exercises  Backwards step up: 1x 20 reps, 6", no UE support    Other Standing Knee Exercises  Hip Hike: 2x 10 reps Bil LE      Knee/Hip Exercises: Supine   Knee Extension  AROM    Knee Extension Limitations  0    Knee Flexion  AROM    Knee Flexion Limitations  130         PT Education - 02/09/18 0952    Education Details  Educated on exercises throughout sesison and continuing to progress safely through protocol.     Person(s) Educated  Patient    Methods   Explanation    Comprehension  Verbalized understanding       PT Short Term Goals - 02/05/18 1055      PT SHORT TERM GOAL #1   Title   Patient will be independent with HEP, updated PRN based on stage in protocol, to promote ACL repair healing and stability of Rt knee to return to work activities and PLOF.     Baseline  01/29/2018:  Reports compliance iwth HEP multiple times a day    Time  2    Period  Weeks    Status  Achieved      PT SHORT TERM GOAL #2   Title  Patient will achieve 0-120 Rt knee ROM up to WNL's or equal to Lt LE for improved mobility with gait and stairs and symmetrical mechanics.      Baseline  --    Time  4    Period  Weeks    Status  Achieved      PT SHORT TERM GOAL #3   Title  Patient will improve MMT by 1/2 grade for all limited groups to indicate significant improvement in functional LE strength    Baseline  --    Time  4    Period  Weeks    Status  Partially Met      PT SHORT TERM GOAL #4   Title  --    Baseline  --    Status  --        PT Long Term Goals - 02/05/18 1056      PT LONG TERM GOAL #1   Title  Patient will improve MMT by 1 grade for all limited groups to indicate significant improvement in functional LE strength.     Time  8    Period  Weeks    Status  Partially Met      PT LONG TERM GOAL #2   Title  Patient will achieve full Rt knee ROM up to WNL's or equal to Lt LE for improved mobility with gait and stairs and symmetrical mechanics.      Baseline  --    Time  8    Period  Weeks    Status  On-going      PT LONG TERM GOAL #3   Title  Patient will demonstrate safe squat mechanics to progress closed chain exercises within protocol while protecting ACL repair to improve strength and reduce risk for re-injury.     Time  8    Period  Weeks  Status  On-going      PT LONG TERM GOAL #4   Title  Patient will ambulate at = or > 1.2 m/s with no assistive device and no brace to return to PLOF with community ambulationa nd mobility  at work.     Baseline  --    Time  8    Period  Weeks    Status  On-going      PT LONG TERM GOAL #5   Title  Patient will (with MD clearance) report being able to walk for 2 miles on trails with no LOB and no knee pain to return to PLOF with hiking and hunting activities.     Baseline  Not cleared by MD yet    Time  8    Period  Weeks    Status  On-going        Plan - 02/09/18 0953    Clinical Impression Statement  Patient continues to progress well through rehab protocol. She has good stability with step downs and single limb squat activities for Rt LE. Her ROM continues to present at 0-130 for Rt knee. Today she was able to progress isokinetic strengthening with greater resistance on slower speed. She denied pain throughout therapy and was able to increase resistance for hamstring strengthening and continued with glut med strengthening today. She continued to report greater difficulty with Lt hip hike exercise compared to Rt LE. Ms. Broda will continue to benefit from skilled PT interventions to address impairments and progress safely through rehab protocol to protect ACL repair as she prepares to return to work.    Rehab Potential  Good    PT Frequency  2x / week    PT Duration  8 weeks    PT Treatment/Interventions  ADLs/Self Care Home Management;Aquatic Therapy;Cryotherapy;Electrical Stimulation;Moist Heat;DME Instruction;Gait training;Stair training;Functional mobility training;Therapeutic activities;Therapeutic exercise;Balance training;Neuromuscular re-education;Patient/family education;Manual techniques;Passive range of motion;Taping    PT Next Visit Plan  Progress ROM exercises for Rt knee within phase II of Dr. Ruthe Mannan ACL protocol. Patient is 7 weeks post-op week of 02/09/18. Progress to biodex isokinetic 90-40 with gradually slower speeds. Continue with step down and add single limb mini-squat next session. Follow up with walking on trail.    PT Home Exercise Plan  Eval: heel  slide, quad set, knee flexion in standing; 10/31;  SLR all planes; prone knee flexion and wall slides     Consulted and Agree with Plan of Care  Patient       Patient will benefit from skilled therapeutic intervention in order to improve the following deficits and impairments:  Abnormal gait, Decreased activity tolerance, Decreased endurance, Decreased range of motion, Decreased strength, Pain, Impaired flexibility, Difficulty walking, Decreased mobility, Decreased coordination, Decreased balance  Visit Diagnosis: Stiffness of right knee, not elsewhere classified  Localized edema  Muscle weakness (generalized)     Problem List Patient Active Problem List   Diagnosis Date Noted  . Rupture of anterior cruciate ligament of right knee   . S/P ACL repair right 12/25/17 05/01/2017  . Vasomotor flushing 08/30/2014  . Pain in joint, shoulder region 12/14/2013  . Muscle weakness (generalized) 12/14/2013  . Decreased range of motion of right shoulder 12/14/2013    Kipp Brood, PT, DPT Physical Therapist with [facility]  02/09/2018 10:42 AM    [place], [place], [address] Phone: [phone]   Fax:  [phone]  Name: DAVE MANNES MRN:  [id number] Date of Birth: 01-06-1961

## 2018-02-11 ENCOUNTER — Encounter (HOSPITAL_COMMUNITY): Payer: Self-pay

## 2018-02-11 ENCOUNTER — Ambulatory Visit (HOSPITAL_COMMUNITY): Payer: 59

## 2018-02-11 DIAGNOSIS — M25661 Stiffness of right knee, not elsewhere classified: Secondary | ICD-10-CM | POA: Diagnosis not present

## 2018-02-11 DIAGNOSIS — M6281 Muscle weakness (generalized): Secondary | ICD-10-CM

## 2018-02-11 DIAGNOSIS — R600 Localized edema: Secondary | ICD-10-CM

## 2018-02-11 NOTE — Therapy (Signed)
[place], [place], [address] Phone: [phone]   Fax:  [phone]  Physical Therapy Treatment  Patient Details  Name: Natalie Hess MRN: [id number] Date of Birth: July 01, 1960 Referring Provider (PT): Arther Abbott   Encounter Date: 02/11/2018  PT End of Session - 02/11/18 1520    Visit Number  12    Number of Visits  17    Date for PT Re-Evaluation  03/04/18    Authorization Type  [place] [employer] (no visit limit, no auth required)    Authorization Time Period  01/07/2018 - 03/06/2018    Authorization - Visit Number  3    Authorization - Number of Visits  10    PT Start Time  6962    PT Stop Time  1558    PT Time Calculation (min)  42 min    Equipment Utilized During Treatment  Other (comment)   Bledso brace   Activity Tolerance  Patient tolerated treatment well    Behavior During Therapy  [facility] for tasks assessed/performed       Past Medical History:  Diagnosis Date  . Medical history non-contributory   . PONV (postoperative nausea and vomiting)    pt states the combination of zofran, decadron and scope patch worked well for her PONV history.    Past Surgical History:  Procedure Laterality Date  . ABDOMINAL HYSTERECTOMY    . ANTERIOR CRUCIATE LIGAMENT REPAIR Right 12/25/2017   Procedure: KNEE ARTHROSCOPY WITH ANTERIOR CRUCIATE LIGAMENT (ACL) REPAIR with Allograft Achilles,  medial and lateral meniscectomy;  Surgeon: Carole Civil, MD;  Location: [facility];  Service: Orthopedics;  Laterality: Right;  . CESAREAN SECTION     X 2  . COLONOSCOPY N/A 02/17/2015   Procedure: COLONOSCOPY;  Surgeon: Rogene Houston, MD;  Location: [facility];  Service: Endoscopy;  Laterality: N/A;  930  . KNEE ARTHROSCOPY WITH MEDIAL MENISECTOMY Right 04/24/2017   Procedure: KNEE ARTHROSCOPY WITH PARTIAL MEDIAL MENISECTOMY; ACL DEBRIEDMENT;  Surgeon: Carole Civil, MD;  Location: [facility];  Service: Orthopedics;   Laterality: Right;    There were no vitals filed for this visit.  Subjective Assessment - 02/11/18 1521    Subjective  Pt states that she couldn't walk today because of the weather. She is anxious for starting work next week.     Limitations  Standing;Walking;House hold activities    Patient Stated Goals  to drive again and get back to work    Currently in Pain?  No/denies           [facility] Adult PT Treatment/Exercise - 02/11/18 0001      Exercises   Exercises  Knee/Hip      Knee/Hip Exercises: Stretches   Active Hamstring Stretch  Right;3 reps;30 seconds;Limitations    Active Hamstring Stretch Limitations  12" box    Gastroc Stretch  Both;3 reps;30 seconds      Knee/Hip Exercises: Aerobic   Elliptical  x4 mins, L3, w/u and to simulate walking/ROM      Knee/Hip Exercises: Machines for Strengthening   Cybex Knee Flexion  Cybex: hamstring curl, 2x 10 reps,  paltes (27 lbs)    Cybex Leg Press  Bodycraft Leg Press: 2x 15 reps 2 plates (95MWU), Rt LE only    Hip Cybex  bil single leg RDLs 30# x15 reps each (at multigym)    Other Machine  Biodex: isokinetic ROM from 90-40 (165, 135, 105), x2RT each (4 sets of  10 at each setting)      Knee/Hip Exercises: Standing   Heel Raises  Right;20 reps    Hip Abduction  Both;2 sets;10 reps    Abduction Limitations  RTB    Rebounder  sidestepping RTB 79f x2RT    Walking with Sports Cord  bil RDLs with 10# x20 reps              PT Short Term Goals - 02/05/18 1055      PT SHORT TERM GOAL #1   Title   Patient will be independent with HEP, updated PRN based on stage in protocol, to promote ACL repair healing and stability of Rt knee to return to work activities and PLOF.     Baseline  01/29/2018:  Reports compliance iwth HEP multiple times a day    Time  2    Period  Weeks    Status  Achieved      PT SHORT TERM GOAL #2   Title  Patient will achieve 0-120 Rt knee ROM up to WNL's or equal to Lt LE for improved mobility with gait  and stairs and symmetrical mechanics.      Baseline  --    Time  4    Period  Weeks    Status  Achieved      PT SHORT TERM GOAL #3   Title  Patient will improve MMT by 1/2 grade for all limited groups to indicate significant improvement in functional LE strength    Baseline  --    Time  4    Period  Weeks    Status  Partially Met      PT SHORT TERM GOAL #4   Title  --    Baseline  --    Status  --        PT Long Term Goals - 02/05/18 1056      PT LONG TERM GOAL #1   Title  Patient will improve MMT by 1 grade for all limited groups to indicate significant improvement in functional LE strength.     Time  8    Period  Weeks    Status  Partially Met      PT LONG TERM GOAL #2   Title  Patient will achieve full Rt knee ROM up to WNL's or equal to Lt LE for improved mobility with gait and stairs and symmetrical mechanics.      Baseline  --    Time  8    Period  Weeks    Status  On-going      PT LONG TERM GOAL #3   Title  Patient will demonstrate safe squat mechanics to progress closed chain exercises within protocol while protecting ACL repair to improve strength and reduce risk for re-injury.     Time  8    Period  Weeks    Status  On-going      PT LONG TERM GOAL #4   Title  Patient will ambulate at = or > 1.2 m/s with no assistive device and no brace to return to PLOF with community ambulationa nd mobility at work.     Baseline  --    Time  8    Period  Weeks    Status  On-going      PT LONG TERM GOAL #5   Title  Patient will (with MD clearance) report being able to walk for 2 miles on trails with no LOB and no knee pain  to return to PLOF with hiking and hunting activities.     Baseline  Not cleared by MD yet    Time  8    Period  Weeks    Status  On-going            Plan - 02/11/18 1601    Clinical Impression Statement  Continued with established POC within pt's protocol focusing on BLE and functional strengthening. Continued with machine strengthening and  biodex and added hip abd, sidestepping, and RDLs for hip and posterior chain strengthening. PT tolerating well, just requiring min cues for proper form. No L hip pain during session, just reports of fatigue. Continue as planned, progressing as able.     Rehab Potential  Good    PT Frequency  2x / week    PT Duration  8 weeks    PT Treatment/Interventions  ADLs/Self Care Home Management;Aquatic Therapy;Cryotherapy;Electrical Stimulation;Moist Heat;DME Instruction;Gait training;Stair training;Functional mobility training;Therapeutic activities;Therapeutic exercise;Balance training;Neuromuscular re-education;Patient/family education;Manual techniques;Passive range of motion;Taping    PT Next Visit Plan  Progress ROM exercises for Rt knee within phase II of Dr. Ruthe Mannan ACL protocol. Patient is 8 weeks post-op week of 02/17/18. Progress to biodex isokinetic 90-40 with gradually slower speeds. Continue with step down and add single limb mini-squat next session. Follow up with walking on trail.    PT Home Exercise Plan  Eval: heel slide, quad set, knee flexion in standing; 10/31;  SLR all planes; prone knee flexion and wall slides     Consulted and Agree with Plan of Care  Patient       Patient will benefit from skilled therapeutic intervention in order to improve the following deficits and impairments:  Abnormal gait, Decreased activity tolerance, Decreased endurance, Decreased range of motion, Decreased strength, Pain, Impaired flexibility, Difficulty walking, Decreased mobility, Decreased coordination, Decreased balance  Visit Diagnosis: Stiffness of right knee, not elsewhere classified  Localized edema  Muscle weakness (generalized)     Problem List Patient Active Problem List   Diagnosis Date Noted  . Rupture of anterior cruciate ligament of right knee   . S/P ACL repair right 12/25/17 05/01/2017  . Vasomotor flushing 08/30/2014  . Pain in joint, shoulder region 12/14/2013  . Muscle  weakness (generalized) 12/14/2013  . Decreased range of motion of right shoulder 12/14/2013       Geraldine Solar PT, DPT  [facility] [address] [place], [place], [address] Phone: [phone]   Fax:  [phone]  Name: SHACORIA LATIF MRN: [id number] Date of Birth: 05/02/1960

## 2018-02-18 ENCOUNTER — Ambulatory Visit (HOSPITAL_COMMUNITY): Payer: 59 | Attending: Orthopedic Surgery

## 2018-02-18 ENCOUNTER — Encounter (HOSPITAL_COMMUNITY): Payer: Self-pay

## 2018-02-18 DIAGNOSIS — R600 Localized edema: Secondary | ICD-10-CM | POA: Insufficient documentation

## 2018-02-18 DIAGNOSIS — M25661 Stiffness of right knee, not elsewhere classified: Secondary | ICD-10-CM | POA: Insufficient documentation

## 2018-02-18 DIAGNOSIS — M6281 Muscle weakness (generalized): Secondary | ICD-10-CM | POA: Diagnosis not present

## 2018-02-18 NOTE — Therapy (Signed)
[place], [place], [address] Phone: [phone]   Fax:  [phone]  Physical Therapy Treatment  Patient Details  Name: Natalie Hess MRN: [id number] Date of Birth: 27-Jan-1961 Referring Provider (PT): Arther Abbott   Encounter Date: 02/18/2018  PT End of Session - 02/18/18 1736    Visit Number  13    Number of Visits  17    Date for PT Re-Evaluation  03/04/18    Authorization Type  [place] [employer] (no visit limit, no auth required)    Authorization Time Period  01/07/2018 - 03/06/2018    Authorization - Visit Number  4    Authorization - Number of Visits  10    PT Start Time  6122    PT Stop Time  1816    PT Time Calculation (min)  45 min    Activity Tolerance  Patient tolerated treatment well    Behavior During Therapy  [facility] for tasks assessed/performed       Past Medical History:  Diagnosis Date  . Medical history non-contributory   . PONV (postoperative nausea and vomiting)    pt states the combination of zofran, decadron and scope patch worked well for her PONV history.    Past Surgical History:  Procedure Laterality Date  . ABDOMINAL HYSTERECTOMY    . ANTERIOR CRUCIATE LIGAMENT REPAIR Right 12/25/2017   Procedure: KNEE ARTHROSCOPY WITH ANTERIOR CRUCIATE LIGAMENT (ACL) REPAIR with Allograft Achilles,  medial and lateral meniscectomy;  Surgeon: Carole Civil, MD;  Location: [facility];  Service: Orthopedics;  Laterality: Right;  . CESAREAN SECTION     X 2  . COLONOSCOPY N/A 02/17/2015   Procedure: COLONOSCOPY;  Surgeon: Rogene Houston, MD;  Location: [facility];  Service: Endoscopy;  Laterality: N/A;  930  . KNEE ARTHROSCOPY WITH MEDIAL MENISECTOMY Right 04/24/2017   Procedure: KNEE ARTHROSCOPY WITH PARTIAL MEDIAL MENISECTOMY; ACL DEBRIEDMENT;  Surgeon: Carole Civil, MD;  Location: [facility];  Service: Orthopedics;  Laterality: Right;    There were no vitals filed for this  visit.  Subjective Assessment - 02/18/18 1729    Subjective  Pt stated she started work this week, no difficulties with work but hasnt began tranferring patients yet.  Stated she completed her standing HEP while working.  No reports of pain today.      Patient Stated Goals  to drive again and get back to work    Currently in Pain?  No/denies         [facility] PT Assessment - 02/18/18 0001      Assessment   Medical Diagnosis  Rt ACL Repair    Referring Provider (PT)  Arther Abbott    Onset Date/Surgical Date  12/25/17    Next MD Visit  03/04/18    Prior Therapy  yes for Rt knee pain      Precautions   Precautions  Knee    Precaution Comments  ACL Repair                   [facility] Adult PT Treatment/Exercise - 02/18/18 0001      Knee/Hip Exercises: Stretches   Active Hamstring Stretch  Right;3 reps;30 seconds;Limitations    Active Hamstring Stretch Limitations  12" box    Gastroc Stretch  Both;3 reps;30 seconds      Knee/Hip Exercises: Aerobic   Elliptical  x4 mins, L3, w/UE and to simulate walking/ROM      Knee/Hip Exercises:  Machines for Strengthening   Cybex Knee Flexion  Cybex: hamstring curl, 2x 10 reps,  paltes (27 lbs)    Cybex Leg Press  Bodycraft Leg Press: 2x 15 reps 3 plates (02HEN), Rt LE only    Hip Cybex  bil single leg RDLs 30# x15 reps each (at multigym)    Other Machine  Biodex: isokinetic ROM from 90-40 (135, 105, 90) x2RT each (4 sets of 10 at each setting)      Knee/Hip Exercises: Standing   Heel Raises  Right;20 reps    Heel Raises Limitations  toe raises, 20 reps, on decline    Step Down  Right;15 reps;Hand Hold: 1;Step Height: 6"    SLS  mini squats 2x 10 45 degree with cueing to reduce     Rebounder  sidestepping RTB 24f x2RT               PT Short Term Goals - 02/05/18 1055      PT SHORT TERM GOAL #1   Title   Patient will be independent with HEP, updated PRN based on stage in protocol, to promote ACL repair healing and  stability of Rt knee to return to work activities and PLOF.     Baseline  01/29/2018:  Reports compliance iwth HEP multiple times a day    Time  2    Period  Weeks    Status  Achieved      PT SHORT TERM GOAL #2   Title  Patient will achieve 0-120 Rt knee ROM up to WNL's or equal to Lt LE for improved mobility with gait and stairs and symmetrical mechanics.      Baseline  --    Time  4    Period  Weeks    Status  Achieved      PT SHORT TERM GOAL #3   Title  Patient will improve MMT by 1/2 grade for all limited groups to indicate significant improvement in functional LE strength    Baseline  --    Time  4    Period  Weeks    Status  Partially Met      PT SHORT TERM GOAL #4   Title  --    Baseline  --    Status  --        PT Long Term Goals - 02/05/18 1056      PT LONG TERM GOAL #1   Title  Patient will improve MMT by 1 grade for all limited groups to indicate significant improvement in functional LE strength.     Time  8    Period  Weeks    Status  Partially Met      PT LONG TERM GOAL #2   Title  Patient will achieve full Rt knee ROM up to WNL's or equal to Lt LE for improved mobility with gait and stairs and symmetrical mechanics.      Baseline  --    Time  8    Period  Weeks    Status  On-going      PT LONG TERM GOAL #3   Title  Patient will demonstrate safe squat mechanics to progress closed chain exercises within protocol while protecting ACL repair to improve strength and reduce risk for re-injury.     Time  8    Period  Weeks    Status  On-going      PT LONG TERM GOAL #4   Title  Patient will ambulate  at = or > 1.2 m/s with no assistive device and no brace to return to PLOF with community ambulationa nd mobility at work.     Baseline  --    Time  8    Period  Weeks    Status  On-going      PT LONG TERM GOAL #5   Title  Patient will (with MD clearance) report being able to walk for 2 miles on trails with no LOB and no knee pain to return to PLOF with hiking  and hunting activities.     Baseline  Not cleared by MD yet    Time  8    Period  Weeks    Status  On-going            Plan - 02/18/18 1806    Clinical Impression Statement  Continued with established POC per ACL protocol, pt at 8 weeks tomorrow.  Added single leg squats with cueing to improve weight distribution and reduce valgus with activity.  Progressed intensity wiht Biodex to 135, 105 and 90.  Able to increased weight with leg press wiht good mechanics.  No reports of pain Lt hip or knee during session.    Rehab Potential  Good    PT Frequency  2x / week    PT Duration  8 weeks    PT Treatment/Interventions  ADLs/Self Care Home Management;Aquatic Therapy;Cryotherapy;Electrical Stimulation;Moist Heat;DME Instruction;Gait training;Stair training;Functional mobility training;Therapeutic activities;Therapeutic exercise;Balance training;Neuromuscular re-education;Patient/family education;Manual techniques;Passive range of motion;Taping    PT Next Visit Plan  Progress ROM exercises for Rt knee within phase II of Dr. Ruthe Mannan ACL protocol. Patient is 8 weeks post-op week of 02/17/18. Progress to biodex isokinetic 90-40 with gradually slower speeds. Continue with step down and single limb mini-squat next session. Follow up with walking on trail.    PT Home Exercise Plan  Eval: heel slide, quad set, knee flexion in standing; 10/31;  SLR all planes; prone knee flexion and wall slides        Patient will benefit from skilled therapeutic intervention in order to improve the following deficits and impairments:  Abnormal gait, Decreased activity tolerance, Decreased endurance, Decreased range of motion, Decreased strength, Pain, Impaired flexibility, Difficulty walking, Decreased mobility, Decreased coordination, Decreased balance  Visit Diagnosis: Stiffness of right knee, not elsewhere classified  Localized edema  Muscle weakness (generalized)     Problem List Patient Active Problem  List   Diagnosis Date Noted  . Rupture of anterior cruciate ligament of right knee   . S/P ACL repair right 12/25/17 05/01/2017  . Vasomotor flushing 08/30/2014  . Pain in joint, shoulder region 12/14/2013  . Muscle weakness (generalized) 12/14/2013  . Decreased range of motion of right shoulder 12/14/2013   Ihor Austin, LPTA; CBIS [phone]  Aldona Lento 02/18/2018, 6:21 PM  [place], [place], [address] Phone: [phone]   Fax:  [phone]  Name: Natalie Hess MRN: [id number] Date of Birth: 08-Jul-1960

## 2018-02-19 ENCOUNTER — Encounter (HOSPITAL_COMMUNITY): Payer: Self-pay

## 2018-02-19 ENCOUNTER — Other Ambulatory Visit: Payer: Self-pay

## 2018-02-19 ENCOUNTER — Ambulatory Visit (HOSPITAL_COMMUNITY): Payer: 59

## 2018-02-19 DIAGNOSIS — M25661 Stiffness of right knee, not elsewhere classified: Secondary | ICD-10-CM | POA: Diagnosis not present

## 2018-02-19 DIAGNOSIS — M6281 Muscle weakness (generalized): Secondary | ICD-10-CM | POA: Diagnosis not present

## 2018-02-19 DIAGNOSIS — R600 Localized edema: Secondary | ICD-10-CM

## 2018-02-19 NOTE — Therapy (Signed)
[place], [place], [address] Phone: [phone]   Fax:  [phone]  Physical Therapy Treatment  Patient Details  Name: Natalie Hess MRN: [id number] Date of Birth: 1960-10-04 Referring Provider (PT): Arther Abbott   Encounter Date: 02/19/2018  PT End of Session - 02/19/18 1541    Visit Number  14    Number of Visits  17    Date for PT Re-Evaluation  03/04/18    Authorization Type  [employer] (no visit limit, no auth required)    Authorization Time Period  01/07/2018 - 03/06/2018    Authorization - Visit Number  5    Authorization - Number of Visits  10    PT Start Time  1519    PT Stop Time  1600    PT Time Calculation (min)  41 min    Activity Tolerance  Patient tolerated treatment well    Behavior During Therapy  [facility] for tasks assessed/performed       Past Medical History:  Diagnosis Date  . Medical history non-contributory   . PONV (postoperative nausea and vomiting)    pt states the combination of zofran, decadron and scope patch worked well for her PONV history.    Past Surgical History:  Procedure Laterality Date  . ABDOMINAL HYSTERECTOMY    . ANTERIOR CRUCIATE LIGAMENT REPAIR Right 12/25/2017   Procedure: KNEE ARTHROSCOPY WITH ANTERIOR CRUCIATE LIGAMENT (ACL) REPAIR with Allograft Achilles,  medial and lateral meniscectomy;  Surgeon: Carole Civil, MD;  Location: [facility];  Service: Orthopedics;  Laterality: Right;  . CESAREAN SECTION     X 2  . COLONOSCOPY N/A 02/17/2015   Procedure: COLONOSCOPY;  Surgeon: Rogene Houston, MD;  Location: [facility];  Service: Endoscopy;  Laterality: N/A;  930  . KNEE ARTHROSCOPY WITH MEDIAL MENISECTOMY Right 04/24/2017   Procedure: KNEE ARTHROSCOPY WITH PARTIAL MEDIAL MENISECTOMY; ACL DEBRIEDMENT;  Surgeon: Carole Civil, MD;  Location: [facility];  Service: Orthopedics;  Laterality: Right;    There were no vitals filed for this  visit.  Subjective Assessment - 02/19/18 1541    Subjective  Patient reports she had a full day of work but that her knee is not bothering her now. She does think her knee is more swollen today after work and states she has been icing and elevating some after work.     Limitations  Standing;Walking;House hold activities    Patient Stated Goals  to drive again and get back to work    Currently in Pain?  No/denies       [facility] Adult PT Treatment/Exercise - 02/19/18 0001      Exercises   Exercises  Knee/Hip      Knee/Hip Exercises: Aerobic   Elliptical  x4 mins, L4, w/UE and to simulate walking/ROM      Knee/Hip Exercises: Machines for Strengthening   Cybex Leg Press  Bodycraft Leg Press: 2x 15 reps 3 plates (93TTS), Rt LE only    Hip Cybex  bil single leg RDLs 30# 2x 10 reps each (at multigym)    Other Machine  Biodex: isokinetic ROM from 60-120 (135, 105, 90) x2RT each (4 sets of 10 at each setting)      Knee/Hip Exercises: Standing   Forward Lunges  Both;2 sets;15 reps;Limitations    Forward Lunges Limitations  single UE support    Walking with Sports Cord  2x 30' RT fwd walking with sports cord, 2x 30'  bkwd walking with sports cord      Knee/Hip Exercises: Supine   Bridges  Both;Strengthening;1 set;10 reps;Limitations    Bridges Limitations  bridge walk outs (3 steps per LE)        PT Education - 02/19/18 1600    Education Details  Educated on exercises throughout and on correct form.     Person(s) Educated  Patient    Methods  Explanation    Comprehension  Verbalized understanding;Returned demonstration       PT Short Term Goals - 02/05/18 1055      PT SHORT TERM GOAL #1   Title   Patient will be independent with HEP, updated PRN based on stage in protocol, to promote ACL repair healing and stability of Rt knee to return to work activities and PLOF.     Baseline  01/29/2018:  Reports compliance iwth HEP multiple times a day    Time  2    Period  Weeks    Status   Achieved      PT SHORT TERM GOAL #2   Title  Patient will achieve 0-120 Rt knee ROM up to WNL's or equal to Lt LE for improved mobility with gait and stairs and symmetrical mechanics.      Baseline  --    Time  4    Period  Weeks    Status  Achieved      PT SHORT TERM GOAL #3   Title  Patient will improve MMT by 1/2 grade for all limited groups to indicate significant improvement in functional LE strength    Baseline  --    Time  4    Period  Weeks    Status  Partially Met      PT SHORT TERM GOAL #4   Title  --    Baseline  --    Status  --        PT Long Term Goals - 02/05/18 1056      PT LONG TERM GOAL #1   Title  Patient will improve MMT by 1 grade for all limited groups to indicate significant improvement in functional LE strength.     Time  8    Period  Weeks    Status  Partially Met      PT LONG TERM GOAL #2   Title  Patient will achieve full Rt knee ROM up to WNL's or equal to Lt LE for improved mobility with gait and stairs and symmetrical mechanics.      Baseline  --    Time  8    Period  Weeks    Status  On-going      PT LONG TERM GOAL #3   Title  Patient will demonstrate safe squat mechanics to progress closed chain exercises within protocol while protecting ACL repair to improve strength and reduce risk for re-injury.     Time  8    Period  Weeks    Status  On-going      PT LONG TERM GOAL #4   Title  Patient will ambulate at = or > 1.2 m/s with no assistive device and no brace to return to PLOF with community ambulationa nd mobility at work.     Baseline  --    Time  8    Period  Weeks    Status  On-going      PT LONG TERM GOAL #5   Title  Patient will (with MD clearance) report  being able to walk for 2 miles on trails with no LOB and no knee pain to return to PLOF with hiking and hunting activities.     Baseline  Not cleared by MD yet    Time  8    Period  Weeks    Status  On-going        Plan - 02/19/18 1540    Clinical Impression Statement   Patient is 8 weeks s/p Rt ACL replacement and exercises were progressed within protocol. Patient performed lunges this session with no reports of pain. She continued with single limb RDL's but required cues for form to facilitate hamstring eccentric activation and strengthening. She also initiated resisted walking with sports cord and bridge walk out to continue progressing hamstring exercises. Biodex isokinetic quad strengthening was progressed within protocol ROM to 60-120 and patient denied pain with this as well. She will continue to benefit from skilled PT interventions to address impairments and progress through protocol to maintain stability of ACL repair.     Rehab Potential  Good    PT Frequency  2x / week    PT Duration  8 weeks    PT Treatment/Interventions  ADLs/Self Care Home Management;Aquatic Therapy;Cryotherapy;Electrical Stimulation;Moist Heat;DME Instruction;Gait training;Stair training;Functional mobility training;Therapeutic activities;Therapeutic exercise;Balance training;Neuromuscular re-education;Patient/family education;Manual techniques;Passive range of motion;Taping    PT Next Visit Plan  Progress ROM exercises for Rt knee within phase II of Dr. Ruthe Mannan ACL protocol. Patient is 8 weeks post-op week of 02/17/18. Progress to biodex isokinetic 60-120 at 135-90 speeds. Continue to progress eccentric quad and hamstring strengthening, proprioceptive training, and lunges.     PT Home Exercise Plan  Eval: heel slide, quad set, knee flexion in standing; 10/31;  SLR all planes; prone knee flexion and wall slides     Consulted and Agree with Plan of Care  Patient       Patient will benefit from skilled therapeutic intervention in order to improve the following deficits and impairments:  Abnormal gait, Decreased activity tolerance, Decreased endurance, Decreased range of motion, Decreased strength, Pain, Impaired flexibility, Difficulty walking, Decreased mobility, Decreased coordination,  Decreased balance  Visit Diagnosis: Stiffness of right knee, not elsewhere classified  Localized edema  Muscle weakness (generalized)     Problem List Patient Active Problem List   Diagnosis Date Noted  . Rupture of anterior cruciate ligament of right knee   . S/P ACL repair right 12/25/17 05/01/2017  . Vasomotor flushing 08/30/2014  . Pain in joint, shoulder region 12/14/2013  . Muscle weakness (generalized) 12/14/2013  . Decreased range of motion of right shoulder 12/14/2013    Kipp Brood, PT, DPT Physical Therapist with [facility]  02/19/2018 4:12 PM    [place] [address] [place], [place], [address] Phone: [phone]   Fax:  [phone]  Name: Natalie Hess MRN: [id number] Date of Birth: 1960/07/13

## 2018-02-24 ENCOUNTER — Ambulatory Visit (HOSPITAL_COMMUNITY): Payer: 59

## 2018-02-24 ENCOUNTER — Encounter (HOSPITAL_COMMUNITY): Payer: Self-pay

## 2018-02-24 DIAGNOSIS — M25661 Stiffness of right knee, not elsewhere classified: Secondary | ICD-10-CM

## 2018-02-24 DIAGNOSIS — R600 Localized edema: Secondary | ICD-10-CM | POA: Diagnosis not present

## 2018-02-24 DIAGNOSIS — M6281 Muscle weakness (generalized): Secondary | ICD-10-CM | POA: Diagnosis not present

## 2018-02-24 NOTE — Therapy (Signed)
[place], [place], [address] Phone: [phone]   Fax:  [phone]  Physical Therapy Treatment  Patient Details  Name: Natalie Hess MRN: [id number] Date of Birth: [birth year] Referring Provider (PT): Arther Abbott   Encounter Date: 02/24/2018  PT End of Session - 02/24/18 1518    Visit Number  15    Number of Visits  17    Date for PT Re-Evaluation  03/04/18    Authorization Type  [employer] (no visit limit, no auth required)    Authorization Time Period  01/07/2018 - 03/06/2018    Authorization - Visit Number  6    Authorization - Number of Visits  10    PT Start Time  7858   4' on elliptical, not included wiht charges   PT Stop Time  1558    PT Time Calculation (min)  43 min    Activity Tolerance  Patient tolerated treatment well    Behavior During Therapy  [facility] for tasks assessed/performed       Past Medical History:  Diagnosis Date  . Medical history non-contributory   . PONV (postoperative nausea and vomiting)    pt states the combination of zofran, decadron and scope patch worked well for her PONV history.    Past Surgical History:  Procedure Laterality Date  . ABDOMINAL HYSTERECTOMY    . ANTERIOR CRUCIATE LIGAMENT REPAIR Right 12/25/2017   Procedure: KNEE ARTHROSCOPY WITH ANTERIOR CRUCIATE LIGAMENT (ACL) REPAIR with Allograft Achilles,  medial and lateral meniscectomy;  Surgeon: Carole Civil, MD;  Location: [facility];  Service: Orthopedics;  Laterality: Right;  . CESAREAN SECTION     X 2  . COLONOSCOPY N/A 02/17/2015   Procedure: COLONOSCOPY;  Surgeon: Rogene Houston, MD;  Location: [facility];  Service: Endoscopy;  Laterality: N/A;  930  . KNEE ARTHROSCOPY WITH MEDIAL MENISECTOMY Right 04/24/2017   Procedure: KNEE ARTHROSCOPY WITH PARTIAL MEDIAL MENISECTOMY; ACL DEBRIEDMENT;  Surgeon: Carole Civil, MD;  Location: [facility];  Service: Orthopedics;  Laterality: Right;    There  were no vitals filed for this visit.  Subjective Assessment - 02/24/18 1516    Subjective  Pt reports she had a full day of work, stood through 2 knee surgeries and knee not bothering her today    Patient Stated Goals  to drive again and get back to work    Currently in Pain?  No/denies         [facility] PT Assessment - 02/24/18 0001      Assessment   Medical Diagnosis  Rt ACL Repair    Referring Provider (PT)  Arther Abbott    Onset Date/Surgical Date  12/25/17    Next MD Visit  03/04/18    Prior Therapy  yes for Rt knee pain      Precautions   Precautions  Knee    Precaution Comments  ACL Repair                   [facility] Adult PT Treatment/Exercise - 02/24/18 0001      Exercises   Exercises  Knee/Hip      Knee/Hip Exercises: Aerobic   Elliptical  x4 mins, L4, w/UE and to simulate walking/ROM      Knee/Hip Exercises: Machines for Strengthening   Cybex Leg Press  Bodycraft Leg Press: 2x 15 reps 3 plates (85OYD), Rt LE only    Hip Cybex  bil single leg RDLs  30# 2x 10 reps each (at multigym)    Other Machine  Biodex: isokinetic ROM from 60-120 (135, 105, 90) x2RT each (4 sets of 10 at each setting)      Knee/Hip Exercises: Standing   Forward Lunges  Both;2 sets;15 reps;Limitations    Forward Lunges Limitations  single UE support    Stairs  5RT 7in step height no HHA, good eccentric control demonstrated    Walking with Sports Cord  2x 30' RT fwd, sidestep and retro walking with sports cord, 2x 30' bkwd walking with sports cord      Knee/Hip Exercises: Supine   Bridges  Both;Strengthening;10 reps;Limitations;2 sets    Bridges Limitations  bridge walk outs (3 steps per LE)               PT Short Term Goals - 02/05/18 1055      PT SHORT TERM GOAL #1   Title   Patient will be independent with HEP, updated PRN based on stage in protocol, to promote ACL repair healing and stability of Rt knee to return to work activities and PLOF.     Baseline   01/29/2018:  Reports compliance iwth HEP multiple times a day    Time  2    Period  Weeks    Status  Achieved      PT SHORT TERM GOAL #2   Title  Patient will achieve 0-120 Rt knee ROM up to WNL's or equal to Lt LE for improved mobility with gait and stairs and symmetrical mechanics.      Baseline  --    Time  4    Period  Weeks    Status  Achieved      PT SHORT TERM GOAL #3   Title  Patient will improve MMT by 1/2 grade for all limited groups to indicate significant improvement in functional LE strength    Baseline  --    Time  4    Period  Weeks    Status  Partially Met      PT SHORT TERM GOAL #4   Title  --    Baseline  --    Status  --        PT Long Term Goals - 02/05/18 1056      PT LONG TERM GOAL #1   Title  Patient will improve MMT by 1 grade for all limited groups to indicate significant improvement in functional LE strength.     Time  8    Period  Weeks    Status  Partially Met      PT LONG TERM GOAL #2   Title  Patient will achieve full Rt knee ROM up to WNL's or equal to Lt LE for improved mobility with gait and stairs and symmetrical mechanics.      Baseline  --    Time  8    Period  Weeks    Status  On-going      PT LONG TERM GOAL #3   Title  Patient will demonstrate safe squat mechanics to progress closed chain exercises within protocol while protecting ACL repair to improve strength and reduce risk for re-injury.     Time  8    Period  Weeks    Status  On-going      PT LONG TERM GOAL #4   Title  Patient will ambulate at = or > 1.2 m/s with no assistive device and no brace to return to PLOF  with community ambulationa nd mobility at work.     Baseline  --    Time  8    Period  Weeks    Status  On-going      PT LONG TERM GOAL #5   Title  Patient will (with MD clearance) report being able to walk for 2 miles on trails with no LOB and no knee pain to return to PLOF with hiking and hunting activities.     Baseline  Not cleared by MD yet    Time  8     Period  Weeks    Status  On-going            Plan - 02/24/18 1557    Clinical Impression Statement  Pt is 9 week post-op for ACL reconstruction and progressed therex within protocol.  Added rebounder with SLS for propriception with difficulty keeping balance and had to lower other foot and/or increased BOS.  Continued wiht single limb RDLs with cueing for form to facilitaiton hamstring eccentric activaiotn and strengthneing.  Added side step and retro gait with sport cord and continues wiht bridge walkout to continue hamstring strengthening.  No reports of pain through session, was limited by fatigue.      Rehab Potential  Good    PT Frequency  2x / week    PT Duration  8 weeks    PT Treatment/Interventions  ADLs/Self Care Home Management;Aquatic Therapy;Cryotherapy;Electrical Stimulation;Moist Heat;DME Instruction;Gait training;Stair training;Functional mobility training;Therapeutic activities;Therapeutic exercise;Balance training;Neuromuscular re-education;Patient/family education;Manual techniques;Passive range of motion;Taping    PT Next Visit Plan  Progress ROM exercises for Rt knee within phase II of Dr. Ruthe Mannan ACL protocol. Patient is 9 weeks post-op week of 02/24/18. Progress to biodex isokinetic 60-120 at 135-90 speeds. Continue to progress eccentric quad and hamstring strengthening, proprioceptive training, and lunges.     PT Home Exercise Plan  Eval: heel slide, quad set, knee flexion in standing; 10/31;  SLR all planes; prone knee flexion and wall slides        Patient will benefit from skilled therapeutic intervention in order to improve the following deficits and impairments:  Abnormal gait, Decreased activity tolerance, Decreased endurance, Decreased range of motion, Decreased strength, Pain, Impaired flexibility, Difficulty walking, Decreased mobility, Decreased coordination, Decreased balance  Visit Diagnosis: Stiffness of right knee, not elsewhere  classified  Localized edema  Muscle weakness (generalized)     Problem List Patient Active Problem List   Diagnosis Date Noted  . Rupture of anterior cruciate ligament of right knee   . S/P ACL repair right 12/25/17 05/01/2017  . Vasomotor flushing 08/30/2014  . Pain in joint, shoulder region 12/14/2013  . Muscle weakness (generalized) 12/14/2013  . Decreased range of motion of right shoulder 12/14/2013   Ihor Austin, [place]; CBIS [phone]  Aldona Lento 02/24/2018, 5:10 PM  [place], [place], [address] Phone: [phone]   Fax:  [phone]  Name: Natalie Hess MRN: [id number] Date of Birth: 1960-05-06

## 2018-02-25 ENCOUNTER — Ambulatory Visit (HOSPITAL_COMMUNITY): Payer: 59

## 2018-02-25 ENCOUNTER — Encounter (HOSPITAL_COMMUNITY): Payer: Self-pay

## 2018-02-25 DIAGNOSIS — M25661 Stiffness of right knee, not elsewhere classified: Secondary | ICD-10-CM | POA: Diagnosis not present

## 2018-02-25 DIAGNOSIS — M6281 Muscle weakness (generalized): Secondary | ICD-10-CM

## 2018-02-25 DIAGNOSIS — R600 Localized edema: Secondary | ICD-10-CM | POA: Diagnosis not present

## 2018-02-25 NOTE — Therapy (Signed)
[place], [place], [address] Phone: [phone]   Fax:  [phone]  Physical Therapy Treatment  Patient Details  Name: Natalie Hess MRN: [id number] Date of Birth: Feb 03, 1961 Referring Provider (PT): Arther Abbott   Encounter Date: 02/25/2018  PT End of Session - 02/25/18 1733    Visit Number  16    Number of Visits  17    Date for PT Re-Evaluation  03/04/18    Authorization Type  [employer] [employer] (no visit limit, no auth required)    Authorization Time Period  01/07/2018 - 03/06/2018    Authorization - Visit Number  7    Authorization - Number of Visits  10    PT Start Time  3710   4' on elliptical, not included wiht charges   PT Stop Time  1820    PT Time Calculation (min)  47 min    Activity Tolerance  Patient tolerated treatment well    Behavior During Therapy  [facility] for tasks assessed/performed       Past Medical History:  Diagnosis Date  . Medical history non-contributory   . PONV (postoperative nausea and vomiting)    pt states the combination of zofran, decadron and scope patch worked well for her PONV history.    Past Surgical History:  Procedure Laterality Date  . ABDOMINAL HYSTERECTOMY    . ANTERIOR CRUCIATE LIGAMENT REPAIR Right 12/25/2017   Procedure: KNEE ARTHROSCOPY WITH ANTERIOR CRUCIATE LIGAMENT (ACL) REPAIR with Allograft Achilles,  medial and lateral meniscectomy;  Surgeon: Carole Civil, MD;  Location: [facility];  Service: Orthopedics;  Laterality: Right;  . CESAREAN SECTION     X 2  . COLONOSCOPY N/A 02/17/2015   Procedure: COLONOSCOPY;  Surgeon: Rogene Houston, MD;  Location: [facility];  Service: Endoscopy;  Laterality: N/A;  930  . KNEE ARTHROSCOPY WITH MEDIAL MENISECTOMY Right 04/24/2017   Procedure: KNEE ARTHROSCOPY WITH PARTIAL MEDIAL MENISECTOMY; ACL DEBRIEDMENT;  Surgeon: Carole Civil, MD;  Location: [facility];  Service: Orthopedics;  Laterality: Right;    There  were no vitals filed for this visit.  Subjective Assessment - 02/25/18 1733    Subjective  Pt stated she is tired today, had to stand for 4 hours during one surgery today.  No reports of pain today.      Patient Stated Goals  to drive again and get back to work    Currently in Pain?  No/denies                       [facility] Adult PT Treatment/Exercise - 02/25/18 0001      Knee/Hip Exercises: Stretches   Gastroc Stretch  Both;3 reps;30 seconds      Knee/Hip Exercises: Aerobic   Elliptical  x4 mins, L4, w/UE and to simulate walking/ROM      Knee/Hip Exercises: Machines for Strengthening   Cybex Knee Flexion  Cybex: hamstring curl, 2x 15 reps,  3.5 plates (62.6 lbs)    Cybex Leg Press  [employer] Leg Press: 2x 15 reps 3 plates (94WNI), Rt LE only    Hip Cybex  bil single leg RDLs 30# 2x 10 reps each (at multigym)    Other Machine  Biodex: isokinetic ROM from 60-120 (135, 105, 90) x2RT each (4 sets of 10 at each setting)      Knee/Hip Exercises: Standing   Forward Lunges  Both;2 sets;15 reps;Limitations    Forward Lunges Limitations  single UE support    Functional Squat  10 reps   SLS with cueing for mechanics   Rebounder  SLS on foam 2x 10 wiht red ball    Walking with Sports Cord  2RT forward and backwards fast walking      Knee/Hip Exercises: Supine   Bridges  15 reps;Limitations    Bridges Limitations  bridge walk outs (3 steps per LE)               PT Short Term Goals - 02/05/18 1055      PT SHORT TERM GOAL #1   Title   Patient will be independent with HEP, updated PRN based on stage in protocol, to promote ACL repair healing and stability of Rt knee to return to work activities and PLOF.     Baseline  01/29/2018:  Reports compliance iwth HEP multiple times a day    Time  2    Period  Weeks    Status  Achieved      PT SHORT TERM GOAL #2   Title  Patient will achieve 0-120 Rt knee ROM up to WNL's or equal to Lt LE for improved mobility with gait and  stairs and symmetrical mechanics.      Baseline  --    Time  4    Period  Weeks    Status  Achieved      PT SHORT TERM GOAL #3   Title  Patient will improve MMT by 1/2 grade for all limited groups to indicate significant improvement in functional LE strength    Baseline  --    Time  4    Period  Weeks    Status  Partially Met      PT SHORT TERM GOAL #4   Title  --    Baseline  --    Status  --        PT Long Term Goals - 02/05/18 1056      PT LONG TERM GOAL #1   Title  Patient will improve MMT by 1 grade for all limited groups to indicate significant improvement in functional LE strength.     Time  8    Period  Weeks    Status  Partially Met      PT LONG TERM GOAL #2   Title  Patient will achieve full Rt knee ROM up to WNL's or equal to Lt LE for improved mobility with gait and stairs and symmetrical mechanics.      Baseline  --    Time  8    Period  Weeks    Status  On-going      PT LONG TERM GOAL #3   Title  Patient will demonstrate safe squat mechanics to progress closed chain exercises within protocol while protecting ACL repair to improve strength and reduce risk for re-injury.     Time  8    Period  Weeks    Status  On-going      PT LONG TERM GOAL #4   Title  Patient will ambulate at = or > 1.2 m/s with no assistive device and no brace to return to PLOF with community ambulationa nd mobility at work.     Baseline  --    Time  8    Period  Weeks    Status  On-going      PT LONG TERM GOAL #5   Title  Patient will (with MD clearance) report being able  to walk for 2 miles on trails with no LOB and no knee pain to return to PLOF with hiking and hunting activities.     Baseline  Not cleared by MD yet    Time  8    Period  Weeks    Status  On-going            Plan - 02/25/18 1814    Clinical Impression Statement  Pt is 9 week post-op for ACL reconstruction, continued therex per protocol.  Able to complete all exercises iwth minimal cueing.  Added SLS  squats wiht cueing for mechanics and foam with rebounder exercises for propriception with some difficulty wiht balalnce.  No reports of pain through session.    PT Treatment/Interventions  ADLs/Self Care Home Management;Aquatic Therapy;Cryotherapy;Electrical Stimulation;Moist Heat;DME Instruction;Gait training;Stair training;Functional mobility training;Therapeutic activities;Therapeutic exercise;Balance training;Neuromuscular re-education;Patient/family education;Manual techniques;Passive range of motion;Taping    PT Next Visit Plan  Reassess next session.  Progress ROM exercises for Rt knee within phase II of Dr. Ruthe Mannan ACL protocol. Patient is 10 weeks post-op week of 03/03/18. Progress to biodex isokinetic 60-120 at 135-90 speeds. Continue to progress eccentric quad and hamstring strengthening, proprioceptive training, and lunges.     PT Home Exercise Plan  Eval: heel slide, quad set, knee flexion in standing; 10/31;  SLR all planes; prone knee flexion and wall slides        Patient will benefit from skilled therapeutic intervention in order to improve the following deficits and impairments:     Visit Diagnosis: Stiffness of right knee, not elsewhere classified  Localized edema  Muscle weakness (generalized)     Problem List Patient Active Problem List   Diagnosis Date Noted  . Rupture of anterior cruciate ligament of right knee   . S/P ACL repair right 12/25/17 05/01/2017  . Vasomotor flushing 08/30/2014  . Pain in joint, shoulder region 12/14/2013  . Muscle weakness (generalized) 12/14/2013  . Decreased range of motion of right shoulder 12/14/2013   Ihor Austin, LPTA; [place]  Aldona Lento 02/25/2018, 6:23 PM  [employer] [place], [place], [address] Phone: [phone]   Fax:  [phone]  Name: Natalie Hess MRN: [id number] Date of Birth: 02-22-1961

## 2018-02-26 ENCOUNTER — Encounter (HOSPITAL_COMMUNITY): Payer: Self-pay

## 2018-03-03 ENCOUNTER — Ambulatory Visit (HOSPITAL_COMMUNITY): Payer: 59 | Admitting: Physical Therapy

## 2018-03-03 DIAGNOSIS — M25661 Stiffness of right knee, not elsewhere classified: Secondary | ICD-10-CM

## 2018-03-03 DIAGNOSIS — R600 Localized edema: Secondary | ICD-10-CM | POA: Diagnosis not present

## 2018-03-03 DIAGNOSIS — M6281 Muscle weakness (generalized): Secondary | ICD-10-CM | POA: Diagnosis not present

## 2018-03-03 NOTE — Therapy (Addendum)
[place], [place], [address] Phone: [phone]   Fax:  [phone]   Progress Note Reporting Period 02/05/18 to 03/03/18  See note below for Objective Data and Assessment of Progress/Goals.    Geraldine Solar PT, DPT   Physical Therapy Treatment  Patient Details  Name: Natalie Hess MRN: [id number] Date of Birth: 07-18-1960 Referring Provider (PT): Arther Abbott   Encounter Date: 03/03/2018  PT End of Session - 03/03/18 1608    Visit Number  17    Number of Visits  17    Date for PT Re-Evaluation  03/04/18    Authorization Type  [employer] [employer] (no visit limit, no auth required)    Authorization Time Period  01/07/2018 - 03/06/2018    Authorization - Visit Number  7    Authorization - Number of Visits  10    PT Start Time  7741    PT Stop Time  1600    PT Time Calculation (min)  45 min    Activity Tolerance  Patient tolerated treatment well    Behavior During Therapy  [facility] for tasks assessed/performed       Past Medical History:  Diagnosis Date  . Medical history non-contributory   . PONV (postoperative nausea and vomiting)    pt states the combination of zofran, decadron and scope patch worked well for her PONV history.    Past Surgical History:  Procedure Laterality Date  . ABDOMINAL HYSTERECTOMY    . ANTERIOR CRUCIATE LIGAMENT REPAIR Right 12/25/2017   Procedure: KNEE ARTHROSCOPY WITH ANTERIOR CRUCIATE LIGAMENT (ACL) REPAIR with Allograft Achilles,  medial and lateral meniscectomy;  Surgeon: Carole Civil, MD;  Location: [facility];  Service: Orthopedics;  Laterality: Right;  . CESAREAN SECTION     X 2  . COLONOSCOPY N/A 02/17/2015   Procedure: COLONOSCOPY;  Surgeon: Rogene Houston, MD;  Location: [facility];  Service: Endoscopy;  Laterality: N/A;  930  . KNEE ARTHROSCOPY WITH MEDIAL MENISECTOMY Right 04/24/2017   Procedure: KNEE ARTHROSCOPY WITH PARTIAL MEDIAL MENISECTOMY; ACL  DEBRIEDMENT;  Surgeon: Carole Civil, MD;  Location: [facility];  Service: Orthopedics;  Laterality: Right;    There were no vitals filed for this visit.      [facility] PT Assessment - 03/03/18 0001      Assessment   Medical Diagnosis  Rt ACL Repair      AROM   Right Knee Extension  0    Right Knee Flexion  135    Left Knee Extension  0    Left Knee Flexion  145      Strength   Right Hip Flexion  5/5    Right Hip Extension  5/5    Right Hip ABduction  5/5    Left Hip Flexion  5/5    Left Hip Extension  5/5    Left Hip ABduction  5/5    Right Knee Flexion  5/5    Right Knee Extension  5/5    Left Knee Flexion  5/5    Left Knee Extension  5/5    Right Ankle Dorsiflexion  5/5    Right Ankle Plantar Flexion  5/5    Left Ankle Dorsiflexion  5/5    Left Ankle Plantar Flexion  5/5                   [facility] Adult PT Treatment/Exercise - 03/03/18 0001  Knee/Hip Exercises: Aerobic   Elliptical  x4 mins, L4, w/UE and to simulate walking/ROM    Tread Mill  3.20mh jogging 2 minutes, fast walking 3.0 mph 3 minutes      Knee/Hip Exercises: Machines for Strengthening   Cybex Knee Extension  1 RM:  Rt: 20# Lt: 24.5# = 80%    Cybex Knee Flexion  1RM:  Rt: 51#, Lt: 57.5= 88%    Other Machine  Biodex: isokinetic ROM from 60-120 (135, 105, 90) x2RT each (4 sets of 10 at each setting)      Knee/Hip Exercises: Standing   Forward Lunges  Both;2 sets;15 reps;Limitations    Forward Lunges Limitations  single UE support    Functional Squat  10 reps   single leg 1/4 squat   Other Standing Knee Exercises  hopping in place, laterals and A/P 10 reps each               PT Short Term Goals - 03/03/18 1610      PT SHORT TERM GOAL #1   Title   Patient will be independent with HEP, updated PRN based on stage in protocol, to promote ACL repair healing and stability of Rt knee to return to work activities and PLOF.     Baseline  01/29/2018:  Reports compliance iwth HEP  multiple times a day    Time  2    Period  Weeks    Status  Achieved      PT SHORT TERM GOAL #2   Title  Patient will achieve 0-120 Rt knee ROM up to WNL's or equal to Lt LE for improved mobility with gait and stairs and symmetrical mechanics.      Time  4    Period  Weeks    Status  Achieved      PT SHORT TERM GOAL #3   Title  Patient will improve MMT by 1/2 grade for all limited groups to indicate significant improvement in functional LE strength    Time  4    Period  Weeks    Status  Achieved        PT Long Term Goals - 03/03/18 1611      PT LONG TERM GOAL #1   Title  Patient will improve MMT by 1 grade for all limited groups to indicate significant improvement in functional LE strength.     Time  8    Period  Weeks    Status  Achieved      PT LONG TERM GOAL #2   Title  Patient will achieve full Rt knee ROM up to WNL's or equal to Lt LE for improved mobility with gait and stairs and symmetrical mechanics.      Baseline  functional but not equal to Lt LE    Time  8    Period  Weeks    Status  Partially Met      PT LONG TERM GOAL #3   Title  Patient will demonstrate safe squat mechanics to progress closed chain exercises within protocol while protecting ACL repair to improve strength and reduce risk for re-injury.     Time  8    Period  Weeks    Status  Achieved      PT LONG TERM GOAL #4   Title  Patient will ambulate at = or > 1.2 m/s with no assistive device and no brace to return to PLOF with community ambulationa nd mobility at work.  Baseline  gait speed > 1.69 m/s no AD (was 1.07 m/s) but continues use of soft brace    Time  8    Period  Weeks    Status  Partially Met      PT LONG TERM GOAL #5   Title  Patient will (with MD clearance) report being able to walk for 2 miles on trails with no LOB and no knee pain to return to PLOF with hiking and hunting activities.     Baseline  Not cleared by MD yet    Time  8    Period  Weeks    Status  On-going             Plan - 03/03/18 1619    Clinical Impression Statement  Re-evaluation completed this session.  Pt is 10 weeks post-op ACL reconstruction following Dr. Althia Forts ACL protocol.  All MMT is equal this session wiht 1 RM revealing 88% hamstring and 80% quad.  Pt does not participate in competitive sports, however does work out regularly and plans on returning to this.  Pt has met all goals except partly meeting 2 LTG's.  Pt may continue on independently at this point.  Returns to MD tomorrow and will call clinic if MD wants to contiue or can be discharged.     PT Treatment/Interventions  ADLs/Self Care Home Management;Aquatic Therapy;Cryotherapy;Electrical Stimulation;Moist Heat;DME Instruction;Gait training;Stair training;Functional mobility training;Therapeutic activities;Therapeutic exercise;Balance training;Neuromuscular re-education;Patient/family education;Manual techniques;Passive range of motion;Taping    PT Next Visit Plan  Anticipate discharge, however pt to call clinic and let therapist know tomorrow after appointment.     PT Home Exercise Plan  Eval: heel slide, quad set, knee flexion in standing; 10/31;  SLR all planes; prone knee flexion and wall slides        Patient will benefit from skilled therapeutic intervention in order to improve the following deficits and impairments:     Visit Diagnosis: Stiffness of right knee, not elsewhere classified  Localized edema  Muscle weakness (generalized)     Problem List Patient Active Problem List   Diagnosis Date Noted  . Rupture of anterior cruciate ligament of right knee   . S/P ACL repair right 12/25/17 05/01/2017  . Vasomotor flushing 08/30/2014  . Pain in joint, shoulder region 12/14/2013  . Muscle weakness (generalized) 12/14/2013  . Decreased range of motion of right shoulder 12/14/2013   Teena Irani, PTA/CLT [phone]  Teena Irani 03/03/2018, 4:28 PM  [place] [address] [place], [place], [address] Phone: [phone]   Fax:  [phone]  Name: Natalie Hess MRN: [id number] Date of Birth: [birth year]

## 2018-03-04 ENCOUNTER — Encounter: Payer: Self-pay | Admitting: Orthopedic Surgery

## 2018-03-04 ENCOUNTER — Telehealth (HOSPITAL_COMMUNITY): Payer: Self-pay

## 2018-03-04 ENCOUNTER — Ambulatory Visit (INDEPENDENT_AMBULATORY_CARE_PROVIDER_SITE_OTHER): Payer: 59 | Admitting: Orthopedic Surgery

## 2018-03-04 VITALS — BP 139/86 | HR 80 | Ht 66.0 in | Wt 146.0 lb

## 2018-03-04 DIAGNOSIS — Z9889 Other specified postprocedural states: Secondary | ICD-10-CM

## 2018-03-04 NOTE — Patient Instructions (Signed)
Wear brace for strenuous activity  Follow-up in April  Home exercises

## 2018-03-04 NOTE — Telephone Encounter (Signed)
Pt saw Md today he states she can request to be D/c today she is doing well.

## 2018-03-04 NOTE — Progress Notes (Signed)
Chief Complaint  Patient presents with  . Routine Post Op     Right knee DOS 12/25/17    Postop day 68 ACL allograft October 10 doing well regained to 135 degrees flexion full extension did a good hop test yesterday today she has no effusion no tenderness her Lockman test is grade 0  I will see her in April she should brace her knee for any strenuous activity she should do home exercises

## 2018-03-05 ENCOUNTER — Ambulatory Visit (HOSPITAL_COMMUNITY): Payer: 59

## 2018-03-09 ENCOUNTER — Encounter (HOSPITAL_COMMUNITY): Payer: Self-pay

## 2018-03-09 NOTE — Therapy (Signed)
[place], [place], [address] Phone: [phone]   Fax:  [phone]  Patient Details  Name: Natalie Hess MRN: [id number] Date of Birth: [birth year] Referring Provider:  No ref. provider found  Encounter Date: 03/09/2018   PHYSICAL THERAPY DISCHARGE SUMMARY  Visits from Start of Care: 17  Current functional level related to goals / functional outcomes: Re-assessment performed on 03/03/18 and routed to MD prior to patient follow up on 03/04/18 with Dr. Aline Brochure. Patient made excellent progress towards goals and met all but 2 of her LTG's. She has returned to work and continues to be compliant with HEP. She called on 03/05/18 following her MD visit and requested to be discharged as MD informed her she could discontinue therapy and continue with HEP and brace during strenuous activities. She is being discharged from this episode of care.   Remaining deficits: See last note for details. Below goal status.  PT Short Term Goals - 03/03/18 1610            PT SHORT TERM GOAL #1   Title   Patient will be independent with HEP, updated PRN based on stage in protocol, to promote ACL repair healing and stability of Rt knee to return to work activities and PLOF.     Baseline  01/29/2018:  Reports compliance iwth HEP multiple times a day    Time  2    Period  Weeks    Status  Achieved        PT SHORT TERM GOAL #2   Title  Patient will achieve 0-120 Rt knee ROM up to WNL's or equal to Lt LE for improved mobility with gait and stairs and symmetrical mechanics.      Time  4    Period  Weeks    Status  Achieved        PT SHORT TERM GOAL #3   Title  Patient will improve MMT by 1/2 grade for all limited groups to indicate significant improvement in functional LE strength    Time  4    Period  Weeks    Status  Achieved           PT Long Term Goals - 03/03/18 1611            PT LONG TERM GOAL #1    Title  Patient will improve MMT by 1 grade for all limited groups to indicate significant improvement in functional LE strength.     Time  8    Period  Weeks    Status  Achieved        PT LONG TERM GOAL #2   Title  Patient will achieve full Rt knee ROM up to WNL's or equal to Lt LE for improved mobility with gait and stairs and symmetrical mechanics.      Baseline  functional but not equal to Lt LE    Time  8    Period  Weeks    Status  Partially Met        PT LONG TERM GOAL #3   Title  Patient will demonstrate safe squat mechanics to progress closed chain exercises within protocol while protecting ACL repair to improve strength and reduce risk for re-injury.     Time  8    Period  Weeks    Status  Achieved        PT LONG TERM GOAL #4   Title  Patient will ambulate  at = or > 1.2 m/s with no assistive device and no brace to return to PLOF with community ambulationa nd mobility at work.     Baseline  gait speed > 1.69 m/s no AD (was 1.07 m/s) but continues use of soft brace    Time  8    Period  Weeks    Status  Partially Met        PT LONG TERM GOAL #5   Title  Patient will (with MD clearance) report being able to walk for 2 miles on trails with no LOB and no knee pain to return to PLOF with hiking and hunting activities.     Baseline  Not cleared by MD yet    Time  8    Period  Weeks    Status  Water quality scientist / Equipment: Educated on [employer] and on ACL healing throughout to encourage caution with strenuous activities.  Plan: Patient agrees to discharge.  Patient goals were met. Patient is being discharged due to the patient's request.  ?????      Kipp Brood, PT, DPT Physical Therapist with [facility]  03/09/2018 9:05 AM    [place], [place], [address] Phone: [phone]   Fax:  [phone]

## 2018-03-13 ENCOUNTER — Telehealth: Payer: Self-pay | Admitting: *Deleted

## 2018-03-13 NOTE — Telephone Encounter (Signed)
Spoke with pt. Pt is requesting a refill on Estradiol 1 mg. Please advise. She hasn't been seen for this. She spoke with you at the hospital. Pt states hot flashes are bad. Thanks!! [place]

## 2018-03-15 NOTE — Telephone Encounter (Signed)
Natalie Hess has NOT been seen in our office since 2008, I will ask that she be seen to discuss symptoms.

## 2018-03-16 NOTE — Telephone Encounter (Signed)
Left message letting pt know she will need to schedule an appt. Hasn't been seen since 2008. [place]

## 2018-05-04 ENCOUNTER — Ambulatory Visit: Payer: 59 | Admitting: Orthopedic Surgery

## 2018-07-01 ENCOUNTER — Ambulatory Visit: Payer: 59 | Admitting: Orthopedic Surgery

## 2018-07-29 ENCOUNTER — Ambulatory Visit (INDEPENDENT_AMBULATORY_CARE_PROVIDER_SITE_OTHER): Payer: 59 | Admitting: Orthopedic Surgery

## 2018-07-29 ENCOUNTER — Other Ambulatory Visit: Payer: Self-pay

## 2018-07-29 ENCOUNTER — Encounter: Payer: Self-pay | Admitting: Orthopedic Surgery

## 2018-07-29 VITALS — BP 143/86 | HR 73 | Temp 97.0°F | Ht 66.0 in | Wt 145.0 lb

## 2018-07-29 DIAGNOSIS — M25551 Pain in right hip: Secondary | ICD-10-CM | POA: Diagnosis not present

## 2018-07-29 DIAGNOSIS — Z9889 Other specified postprocedural states: Secondary | ICD-10-CM

## 2018-07-29 DIAGNOSIS — M25552 Pain in left hip: Secondary | ICD-10-CM | POA: Diagnosis not present

## 2018-07-29 NOTE — Progress Notes (Signed)
Chief Complaint  Patient presents with  . 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  Respiratory: Negative for shortness of breath.   Cardiovascular: Negative for chest pain.  Neurological: Negative for tingling.   Complains of bilateral hip pain left worse than right  Physical Exam Vitals signs and nursing note reviewed.  Constitutional:      Appearance: Normal appearance.  Musculoskeletal:     Right hip: Normal.     Left hip: Normal.     Lumbar back: She exhibits normal range of motion, no tenderness and no bony tenderness.       Legs:  Neurological:     Mental Status: She is alert and oriented to person, place, and time.  Psychiatric:        Mood and Affect: Mood normal.      Encounter Diagnoses  Name Primary?  . S/P ACL repair right 12/25/17 Yes  . Pain of both hip joints    Hips exam is normal no palpable tenderness in lumbar spine or greater trochanter probably referred pain from her back  Nothing to do at present FU PRN

## 2018-08-26 ENCOUNTER — Ambulatory Visit (INDEPENDENT_AMBULATORY_CARE_PROVIDER_SITE_OTHER): Payer: 59 | Admitting: Nurse Practitioner

## 2018-08-26 ENCOUNTER — Encounter: Payer: Self-pay | Admitting: Nurse Practitioner

## 2018-08-26 ENCOUNTER — Other Ambulatory Visit: Payer: Self-pay

## 2018-08-26 VITALS — BP 124/78 | Temp 97.4°F | Wt 147.2 lb

## 2018-08-26 DIAGNOSIS — Z0000 Encounter for general adult medical examination without abnormal findings: Secondary | ICD-10-CM

## 2018-08-26 DIAGNOSIS — Z13220 Encounter for screening for lipoid disorders: Secondary | ICD-10-CM

## 2018-08-26 DIAGNOSIS — Z01419 Encounter for gynecological examination (general) (routine) without abnormal findings: Secondary | ICD-10-CM

## 2018-08-26 NOTE — Progress Notes (Signed)
Subjective:    Patient ID: Natalie Hess, female    DOB: Nov 14, 1960, 58 y.o.   MRN: [id number]  HPI The patient comes in today for a wellness visit.    A review of their health history was completed.  A review of medications was also completed.  Any needed refills; none  Eating habits: healthy eating   Falls/  MVA accidents in past few months: none  Regular exercise: somewhat  Specialist pt sees on regular basis: none  Preventative health issues were discussed.   Additional concerns: pt would like something for hot flashes.  Presents for her wellness exam.  Same sexual partner.  Has had a hysterectomy with right oophorectomy/salpingectomy.  No pelvic pain.  No vaginal discharge.  No rash.  Regular vision and dental exams.  Review of Systems  Constitutional: Negative for activity change, appetite change and fatigue.  HENT: Negative for dental problem, ear pain, sinus pressure and sore throat.   Respiratory: Negative for cough, chest tightness, shortness of breath and wheezing.   Cardiovascular: Negative for chest pain.  Gastrointestinal: Negative for abdominal distention, abdominal pain, constipation, diarrhea, nausea and vomiting.  Genitourinary: Negative for difficulty urinating, dysuria, enuresis, frequency, genital sores, pelvic pain, urgency and vaginal discharge.       Objective:   Physical Exam Constitutional:      General: She is not in acute distress.    Appearance: She is well-developed.  Neck:     Musculoskeletal: Normal range of motion and neck supple.     Thyroid: No thyromegaly.     Trachea: No tracheal deviation.     Comments: Thyroid nontender; no mass or goiter noted.  Cardiovascular:     Rate and Rhythm: Normal rate and regular rhythm.     Heart sounds: Normal heart sounds. No murmur. No gallop.   Pulmonary:     Effort: Pulmonary effort is normal.     Breath sounds: Normal breath sounds.  Chest:     Breasts:        Right: Normal. No inverted  nipple, mass, nipple discharge, skin change or tenderness.        Left: Normal. No inverted nipple, mass, nipple discharge, skin change or tenderness.  Abdominal:     General: There is no distension.     Palpations: Abdomen is soft.     Tenderness: There is no abdominal tenderness.  Genitourinary:    Comments: Recommend GU exam but patient defers at this time. Denies any problems.  Lymphadenopathy:     Cervical: No cervical adenopathy.     Upper Body:     Right upper body: No axillary or pectoral adenopathy.     Left upper body: No axillary or pectoral adenopathy.  Skin:    General: Skin is warm and dry.     Findings: No rash.     Comments: Significant sun damage noted.   Neurological:     Mental Status: She is alert and oriented to person, place, and time.  Psychiatric:        Mood and Affect: Mood normal.        Behavior: Behavior normal.        Thought Content: Thought content normal.        Judgment: Judgment normal.           Assessment & Plan:  1. Well woman exam with routine gynecological exam - COMPLETE METABOLIC PANEL WITH GFR - Lipid Profile  2. Screening, lipid - COMPLETE METABOLIC PANEL WITH GFR -  Lipid Profile  Patient plans to schedule her own mammogram.  Gets regular skin cancer screening.  Discussed natural supplements to help with hot flashes. Return in about 1 year (around 08/26/2019) for physical.

## 2018-08-26 NOTE — Patient Instructions (Signed)
Black cohosh Flaxseed  Soy Look at [employer]

## 2018-08-29 ENCOUNTER — Encounter: Payer: Self-pay | Admitting: Nurse Practitioner

## 2018-09-17 ENCOUNTER — Ambulatory Visit (HOSPITAL_COMMUNITY)
Admission: RE | Admit: 2018-09-17 | Discharge: 2018-09-17 | Disposition: A | Discharge status: Home / Self Care | Payer: 59 | Source: Ambulatory Visit | Attending: Obstetrics and Gynecology | Admitting: Obstetrics and Gynecology

## 2018-09-17 ENCOUNTER — Other Ambulatory Visit (HOSPITAL_COMMUNITY): Payer: Self-pay | Admitting: Obstetrics and Gynecology

## 2018-09-17 ENCOUNTER — Other Ambulatory Visit: Payer: Self-pay

## 2018-09-17 DIAGNOSIS — Z1231 Encounter for screening mammogram for malignant neoplasm of breast: Secondary | ICD-10-CM | POA: Diagnosis not present

## 2018-09-22 DIAGNOSIS — Z01419 Encounter for gynecological examination (general) (routine) without abnormal findings: Secondary | ICD-10-CM | POA: Diagnosis not present

## 2018-09-22 DIAGNOSIS — Z13220 Encounter for screening for lipoid disorders: Secondary | ICD-10-CM | POA: Diagnosis not present

## 2018-09-23 ENCOUNTER — Telehealth: Payer: Self-pay | Admitting: Obstetrics and Gynecology

## 2018-09-23 LAB — LIPID PANEL
Chol/HDL Ratio: 2 ratio (ref 0.0–4.4)
Cholesterol, Total: 202 mg/dL — ABNORMAL HIGH (ref 100–199)
HDL: 99 mg/dL (ref >39)
LDL Calculated: 89 mg/dL (ref 0–99)
Triglycerides: 69 mg/dL (ref 0–149)
VLDL Cholesterol Cal: 14 mg/dL (ref 5–40)

## 2018-09-23 LAB — COMPREHENSIVE METABOLIC PANEL
ALT: 16 IU/L (ref 0–32)
AST: 20 IU/L (ref 0–40)
Albumin/Globulin Ratio: 2.3 — ABNORMAL HIGH (ref 1.2–2.2)
Albumin: 4.6 g/dL (ref 3.8–4.9)
Alkaline Phosphatase: 69 IU/L (ref 39–117)
BUN/Creatinine Ratio: 20 (ref 9–23)
BUN: 14 mg/dL (ref 6–24)
Bilirubin Total: 0.4 mg/dL (ref 0.0–1.2)
CO2: 25 mmol/L (ref 20–29)
Calcium: 9.4 mg/dL (ref 8.7–10.2)
Chloride: 99 mmol/L (ref 96–106)
Creatinine, Ser: 0.69 mg/dL (ref 0.57–1.00)
GFR calc Af Amer: 111 mL/min/{1.73_m2} (ref >59)
GFR calc non Af Amer: 96 mL/min/{1.73_m2} (ref >59)
Globulin, Total: 2 g/dL (ref 1.5–4.5)
Glucose: 91 mg/dL (ref 65–99)
Potassium: 4.2 mmol/L (ref 3.5–5.2)
Sodium: 138 mmol/L (ref 134–144)
Total Protein: 6.6 g/dL (ref 6.0–8.5)

## 2018-09-23 NOTE — Telephone Encounter (Signed)
LM that Mammogram reassuring, benign Cat I.

## 2019-02-16 ENCOUNTER — Encounter: Payer: Self-pay | Admitting: Nurse Practitioner

## 2019-02-16 ENCOUNTER — Other Ambulatory Visit: Payer: Self-pay | Admitting: Nurse Practitioner

## 2019-02-16 MED ORDER — ESTRADIOL 0.025 MG/24HR TD PTTW: 1.0000 | MEDICATED_PATCH | TRANSDERMAL | 2 refills | Status: DC

## 2019-02-16 MED FILL — ESTRADIOL 0.025 MG PATCH: 0.025 | 28 days supply | Qty: 8 | Fill #0

## 2019-03-15 MED FILL — ESTRADIOL 0.025 MG PATCH: 0.025 | 28 days supply | Qty: 8 | Fill #1

## 2019-04-21 ENCOUNTER — Encounter: Payer: Self-pay | Admitting: Family Medicine

## 2019-04-27 MED FILL — ESTRADIOL 0.025 MG PATCH: 0.025 | 28 days supply | Qty: 8 | Fill #2

## 2019-05-11 ENCOUNTER — Ambulatory Visit (INDEPENDENT_AMBULATORY_CARE_PROVIDER_SITE_OTHER): Payer: No Typology Code available for payment source | Admitting: Family Medicine

## 2019-05-11 ENCOUNTER — Other Ambulatory Visit: Payer: Self-pay

## 2019-05-11 DIAGNOSIS — M545 Low back pain, unspecified: Secondary | ICD-10-CM

## 2019-05-11 DIAGNOSIS — G8929 Other chronic pain: Secondary | ICD-10-CM | POA: Diagnosis not present

## 2019-05-11 NOTE — Progress Notes (Signed)
   Subjective:    Patient ID: MCKAYLE BREI, female    DOB: April 03, 1960, 59 y.o.   MRN: [id number]  HPI  Patient calls requesting a referral for hip and back pain. Patient's insurance requires referral from PCP.    Virtual Visit via Video Note  I connected with Karin Lieu on 05/11/19 at  2:30 PM EST by a video enabled telemedicine application and verified that I am speaking with the correct person using two identifiers.  Location: Patient: home Provider: office   I discussed the limitations of evaluation and management by telemedicine and the availability of in person appointments. The patient expressed understanding and agreed to proceed.  History of Present Illness:    Observations/Objective:   Assessment and Plan:   Follow Up Instructions:    I discussed the assessment and treatment plan with the patient. The patient was provided an opportunity to ask questions and all were answered. The patient agreed with the plan and demonstrated an understanding of the instructions.   The patient was advised to call back or seek an in-person evaluation if the symptoms worsen or if the condition fails to improve as anticipated.  I provided 22 minutes of non-face-to-face time during this encounter.  Pain every day  Low back aching and throbbing  Usually stays in the back area Left hip worse than right hip  Also sig back pain  Patient saw Dr. Aline Brochure in the past who felt her pain was not in her hips  Pain does not radiate into the thighs or below.  Daily nearly always in the low back area with posterior hip involvement bilateral   Review of Systems See above    Objective:   Physical Exam   Virtual     Assessment & Plan:  Impression chronic low back pain.  Patient would like to see a specialist.  With absence of true sciatica symptoms I really think the best specialist is an orthopedist who specializes in low back pain.  Discussed referral to Dr. Rolena Infante or similar

## 2019-05-12 ENCOUNTER — Ambulatory Visit: Payer: 59 | Admitting: Family Medicine

## 2019-05-20 ENCOUNTER — Encounter: Payer: Self-pay | Admitting: Family Medicine

## 2019-05-25 ENCOUNTER — Other Ambulatory Visit: Payer: Self-pay | Admitting: Nurse Practitioner

## 2019-05-26 MED FILL — ESTRADIOL 0.025 MG PATCH: 0.025 | 28 days supply | Qty: 8 | Fill #0

## 2019-06-28 MED FILL — ESTRADIOL 0.025 MG PATCH: 0.025 | 28 days supply | Qty: 8 | Fill #1

## 2019-08-18 ENCOUNTER — Other Ambulatory Visit: Payer: Self-pay | Admitting: Nurse Practitioner

## 2019-08-18 MED FILL — ESTRADIOL 0.025 MG PATCH: 0.025 | 28 days supply | Qty: 8 | Fill #2

## 2019-08-25 ENCOUNTER — Telehealth: Payer: Self-pay | Admitting: Family Medicine

## 2019-08-25 NOTE — Telephone Encounter (Signed)
[facility] Pt left a voicemail requesting referral to be sent to Dr. Caprice Beaver (pt has the [facility] Focus plan)  Left message-need appt info

## 2019-09-26 ENCOUNTER — Encounter (HOSPITAL_COMMUNITY): Payer: Self-pay | Admitting: Emergency Medicine

## 2019-09-26 ENCOUNTER — Emergency Department (HOSPITAL_COMMUNITY): Payer: No Typology Code available for payment source

## 2019-09-26 ENCOUNTER — Other Ambulatory Visit: Payer: Self-pay

## 2019-09-26 ENCOUNTER — Emergency Department (HOSPITAL_COMMUNITY)
Admission: EM | Admit: 2019-09-26 | Discharge: 2019-09-26 | Disposition: A | Discharge status: Home / Self Care | Payer: No Typology Code available for payment source | Attending: Emergency Medicine | Admitting: Emergency Medicine

## 2019-09-26 DIAGNOSIS — R630 Anorexia: Secondary | ICD-10-CM | POA: Insufficient documentation

## 2019-09-26 DIAGNOSIS — Z20822 Contact with and (suspected) exposure to covid-19: Secondary | ICD-10-CM | POA: Insufficient documentation

## 2019-09-26 DIAGNOSIS — K529 Noninfective gastroenteritis and colitis, unspecified: Secondary | ICD-10-CM | POA: Insufficient documentation

## 2019-09-26 DIAGNOSIS — R109 Unspecified abdominal pain: Secondary | ICD-10-CM | POA: Diagnosis present

## 2019-09-26 DIAGNOSIS — Z87891 Personal history of nicotine dependence: Secondary | ICD-10-CM | POA: Insufficient documentation

## 2019-09-26 DIAGNOSIS — R1030 Lower abdominal pain, unspecified: Secondary | ICD-10-CM | POA: Insufficient documentation

## 2019-09-26 LAB — CBC WITH DIFFERENTIAL/PLATELET
Abs Immature Granulocytes: 0.01 10*3/uL (ref 0.00–0.07)
Basophils Absolute: 0 10*3/uL (ref 0.0–0.1)
Basophils Relative: 1 %
Eosinophils Absolute: 0.1 10*3/uL (ref 0.0–0.5)
Eosinophils Relative: 2 %
HCT: 43.9 % (ref 36.0–46.0)
Hemoglobin: 14.7 g/dL (ref 12.0–15.0)
Immature Granulocytes: 0 %
Lymphocytes Relative: 19 %
Lymphs Abs: 1.1 10*3/uL (ref 0.7–4.0)
MCH: 29.9 pg (ref 26.0–34.0)
MCHC: 33.5 g/dL (ref 30.0–36.0)
MCV: 89.2 fL (ref 80.0–100.0)
Monocytes Absolute: 0.4 10*3/uL (ref 0.1–1.0)
Monocytes Relative: 7 %
Neutro Abs: 4.1 10*3/uL (ref 1.7–7.7)
Neutrophils Relative %: 71 %
Platelets: 202 10*3/uL (ref 150–400)
RBC: 4.92 MIL/uL (ref 3.87–5.11)
RDW: 11.8 % (ref 11.5–15.5)
WBC: 5.7 10*3/uL (ref 4.0–10.5)
nRBC: 0 % (ref 0.0–0.2)

## 2019-09-26 LAB — COMPREHENSIVE METABOLIC PANEL
ALT: 17 U/L (ref 0–44)
AST: 22 U/L (ref 15–41)
Albumin: 4.3 g/dL (ref 3.5–5.0)
Alkaline Phosphatase: 65 U/L (ref 38–126)
Anion gap: 9 (ref 5–15)
BUN: 11 mg/dL (ref 6–20)
CO2: 27 mmol/L (ref 22–32)
Calcium: 9.2 mg/dL (ref 8.9–10.3)
Chloride: 103 mmol/L (ref 98–111)
Creatinine, Ser: 0.58 mg/dL (ref 0.44–1.00)
GFR calc Af Amer: >60 mL/min (ref >60)
GFR calc non Af Amer: >60 mL/min (ref >60)
Glucose, Bld: 97 mg/dL (ref 70–99)
Potassium: 3.5 mmol/L (ref 3.5–5.1)
Sodium: 139 mmol/L (ref 135–145)
Total Bilirubin: 1 mg/dL (ref 0.3–1.2)
Total Protein: 7.3 g/dL (ref 6.5–8.1)

## 2019-09-26 LAB — SARS CORONAVIRUS 2 BY RT PCR (HOSPITAL ORDER, PERFORMED IN ~~LOC~~ HOSPITAL LAB): SARS Coronavirus 2: NEGATIVE

## 2019-09-26 LAB — LIPASE, BLOOD: Lipase: 28 U/L (ref 11–51)

## 2019-09-26 MED ORDER — ONDANSETRON HCL 4 MG/2ML IJ SOLN
4.0000 mg | Freq: Once | INTRAMUSCULAR | Status: AC
  Administered 2019-09-26: 4 mg via INTRAVENOUS
  Filled 2019-09-26: qty 2

## 2019-09-26 MED ORDER — IOHEXOL 300 MG/ML  SOLN
100.0000 mL | Freq: Once | INTRAMUSCULAR | Status: AC | PRN
  Administered 2019-09-26: 100 mL via INTRAVENOUS

## 2019-09-26 MED ORDER — METRONIDAZOLE 500 MG PO TABS: 500.0000 mg | ORAL_TABLET | Freq: Three times a day (TID) | ORAL | 0 refills | Status: AC

## 2019-09-26 MED ORDER — ONDANSETRON 4 MG PO TBDP: 4.0000 mg | ORAL_TABLET | Freq: Three times a day (TID) | ORAL | 0 refills | Status: DC | PRN

## 2019-09-26 MED ORDER — CIPROFLOXACIN HCL 500 MG PO TABS: 500.0000 mg | ORAL_TABLET | Freq: Two times a day (BID) | ORAL | 0 refills | Status: AC

## 2019-09-26 NOTE — ED Triage Notes (Signed)
Patient c/o right lower abd pain. Per patient nausea and small amount of diarrhea. Patient denies fever or vomiting. Denies any blood in stool.

## 2019-09-26 NOTE — Discharge Instructions (Addendum)
You were seen in the ER for abdominal pain diarrhea  CT showed right sided colitis.  Appendix was normal.   Take antibiotics as prescribed. Zofran for nausea as needed. Tylenol for pain. Probiotics can help diarrhea.   Return for worsening symptoms, fever

## 2019-09-26 NOTE — ED Provider Notes (Signed)
[facility] EMERGENCY DEPARTMENT Provider Note   CSN: [id number] Arrival date & time: 09/26/19  1149     History Chief Complaint  Patient presents with  . Abdominal Pain    Natalie Hess is a 59 y.o. female presents to the ED for evaluation of abdominal pain.  Onset 24 hours ago.  Located in the right lower quadrant, nonradiating, constant.  Worse with movement, walking and palpation.  Slightly better if she just stays still.  Associated with nausea, decreased appetite.  Had one episode of non bloody diarrhea this morning. She is worried it is her appendix.  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  History of abdominal hysterectomy and 2 C-sections.  Took ibuprofen without relief.  No associated fever, vomiting.  No UTI symptoms.  No changes in her stools.  No vaginal symptoms like abnormal vaginal bleeding, vaginal discharge.  No history of kidney stones.  No flank pain.   HPI     Past Medical History:  Diagnosis Date  . Medical history non-contributory   . PONV (postoperative nausea and vomiting)    pt states the combination of zofran, decadron and scope patch worked well for her PONV history.    Patient Active Problem List   Diagnosis Date Noted  . Rupture of anterior cruciate ligament of right knee   . S/P ACL repair right 12/25/17 05/01/2017  . Vasomotor flushing 08/30/2014  . Pain in joint, shoulder region 12/14/2013  . Muscle weakness (generalized) 12/14/2013  . Decreased range of motion of right shoulder 12/14/2013    Past Surgical History:  Procedure Laterality Date  . ABDOMINAL HYSTERECTOMY    . ANTERIOR CRUCIATE LIGAMENT REPAIR Right 12/25/2017   Procedure: KNEE ARTHROSCOPY WITH ANTERIOR CRUCIATE LIGAMENT (ACL) REPAIR with Allograft Achilles,  medial and lateral meniscectomy;  Surgeon: Carole Civil, MD;  Location: [facility] ORS;  Service: Orthopedics;  Laterality: Right;  . CESAREAN SECTION     X 2  . COLONOSCOPY N/A 02/17/2015   Procedure: COLONOSCOPY;   Surgeon: Rogene Houston, MD;  Location: [facility] ENDO SUITE;  Service: Endoscopy;  Laterality: N/A;  930  . KNEE ARTHROSCOPY WITH MEDIAL MENISECTOMY Right 04/24/2017   Procedure: KNEE ARTHROSCOPY WITH PARTIAL MEDIAL MENISECTOMY; ACL DEBRIEDMENT;  Surgeon: Carole Civil, MD;  Location: [facility] ORS;  Service: Orthopedics;  Laterality: Right;     OB History    Gravida      Para      Term      Preterm      AB      Living  2     SAB      TAB      Ectopic      Multiple      Live Births              Family History  Problem Relation Age of Onset  . Melanoma Mother        died from metastatic disease    Social History   Tobacco Use  . Smoking status: Former Smoker    Types: Cigarettes  . Smokeless tobacco: Never Used  Vaping Use  . Vaping Use: Never used  Substance Use Topics  . Alcohol use: Yes    Alcohol/week: 0.0 standard drinks    Comment: socially  . Drug use: No    Home Medications Prior to Admission medications   Medication Sig Start Date End Date Taking? Authorizing Provider  aspirin-acetaminophen-caffeine (EXCEDRIN MIGRAINE) [phone] MG tablet Take 2 tablets  by mouth every 6 (six) hours as needed (for pain.).    [provider]  ciprofloxacin (CIPRO) 500 MG tablet Take 1 tablet (500 mg total) by mouth every 12 (twelve) hours for 10 days. 09/26/19 10/06/19  Kinnie Feil, PA-C  estradiol (VIVELLE-DOT) 0.025 MG/24HR PLACE 1 PATCH ONTO THE SKIN TWICE A WEEK. DO NOT APPLY TO THE BREAST AREA. 08/18/19   Nilda Simmer, NP  ibuprofen (ADVIL,MOTRIN) 800 MG tablet Take 1 tablet (800 mg total) by mouth every 8 (eight) hours as needed. 04/24/17   Carole Civil, MD  metroNIDAZOLE (FLAGYL) 500 MG tablet Take 1 tablet (500 mg total) by mouth 3 (three) times daily for 10 days. 09/26/19 10/06/19  Kinnie Feil, PA-C  ondansetron (ZOFRAN ODT) 4 MG disintegrating tablet Take 1 tablet (4 mg total) by mouth every 8 (eight) hours as needed for nausea or  vomiting. 09/26/19   Kinnie Feil, PA-C    Allergies    Patient has no known allergies.  Review of Systems   Review of Systems  Constitutional: Positive for appetite change.  Gastrointestinal: Positive for abdominal pain and nausea.  All other systems reviewed and are negative.   Physical Exam Updated Vital Signs BP (!) 148/85 (BP Location: Left Arm)   Pulse 80   Temp 98.2 F (36.8 C) (Oral)   Resp 18   Ht 5\' 7"  (1.702 m)   Wt 61.2 kg   SpO2 99%   BMI 21.14 kg/m   Physical Exam Vitals and nursing note reviewed.  Constitutional:      Appearance: She is well-developed.     Comments: Non toxic in NAD  HENT:     Head: Normocephalic and atraumatic.     Nose: Nose normal.  Eyes:     Conjunctiva/sclera: Conjunctivae normal.  Cardiovascular:     Rate and Rhythm: Normal rate and regular rhythm.  Pulmonary:     Effort: Pulmonary effort is normal.     Breath sounds: Normal breath sounds.  Abdominal:     General: Bowel sounds are normal.     Palpations: Abdomen is soft.     Tenderness: There is abdominal tenderness in the right lower quadrant. There is guarding. Positive signs include McBurney's sign.     Comments: Negative Rovsing's, obturator's. No G/R/R. No suprapubic or CVA tenderness. Negative Murphy's.   Musculoskeletal:        General: Normal range of motion.     Cervical back: Normal range of motion.  Skin:    General: Skin is warm and dry.     Capillary Refill: Capillary refill takes less than 2 seconds.  Neurological:     Mental Status: She is alert.  Psychiatric:        Behavior: Behavior normal.     ED Results / Procedures / Treatments   Labs (all labs ordered are listed, but only abnormal results are displayed) Labs Reviewed  SARS CORONAVIRUS 2 BY RT PCR (HOSPITAL ORDER, [place] [facility])  CBC WITH DIFFERENTIAL/PLATELET  COMPREHENSIVE METABOLIC PANEL  LIPASE, BLOOD  URINALYSIS, ROUTINE W REFLEX MICROSCOPIC     EKG None  Radiology CT Abdomen Pelvis W Contrast  Result Date: 09/26/2019 CLINICAL DATA:  Right lower quadrant pain EXAM: CT ABDOMEN AND PELVIS WITH CONTRAST TECHNIQUE: Multidetector CT imaging of the abdomen and pelvis was performed using the standard protocol following bolus administration of intravenous contrast. CONTRAST:  16mL OMNIPAQUE IOHEXOL 300 MG/ML  SOLN COMPARISON:  11/17/2006 FINDINGS: Lower chest: Dependent  atelectasis posteriorly in both lung bases. No pleural or pericardial effusion. Hepatobiliary: No focal liver abnormality is seen. No gallstones, gallbladder wall thickening, or biliary dilatation. Pancreas: Unremarkable. No pancreatic ductal dilatation or surrounding inflammatory changes. Spleen: Normal in size without focal abnormality. Adrenals/Urinary Tract: Adrenal glands unremarkable. Subcentimeter probable cyst, upper pole left kidney. No hydronephrosis. Urinary bladder incompletely distended. Stomach/Bowel: Stomach is incompletely distended. Small bowel decompressed. Normal appendix. There is focal wall thickening in the ascending colon and hepatic flexure With surrounding edema. No evidence of abscess. The more distal colon and rectum are unremarkable. Vascular/Lymphatic: Minimal calcified aortic plaque without aneurysm or stenosis. No abdominal or pelvic adenopathy. Reproductive: Status post hysterectomy. No adnexal masses. Other: Right pelvic phleboliths.  No ascites.  No free air. Musculoskeletal: No acute or significant osseous findings. IMPRESSION: 1. Focal wall thickening in the ascending colon and hepatic flexure with surrounding edema, suggesting colitis, without abscess. 2. Normal appendix. Aortic Atherosclerosis (ICD10-I70.0). Electronically Signed   By: Lucrezia Europe M.D.   On: 09/26/2019 14:04    Procedures Procedures (including critical care time)  Medications Ordered in ED Medications  ondansetron (ZOFRAN) injection 4 mg (4 mg Intravenous Given 09/26/19 1307)   iohexol (OMNIPAQUE) 300 MG/ML solution 100 mL (100 mLs Intravenous Contrast Given 09/26/19 1352)    ED Course  I have reviewed the triage vital signs and the nursing notes.  Pertinent labs & imaging results that were available during my care of the patient were reviewed by me and considered in my medical decision making (see chart for details).  Clinical Course as of Sep 25 1420  Sun Sep 26, 2019  1410 IMPRESSION: 1. Focal wall thickening in the ascending colon and hepatic flexure with surrounding edema, suggesting colitis, without abscess. 2. Normal appendix.  CT Abdomen Pelvis W Contrast [CG]    Clinical Course User Index [CG] Kinnie Feil, PA-C   MDM Rules/Calculators/A&P                          I obtained additional history from triage, nursing notes and review of medical chart.  Previous medical records available, nursing notes reviewed to obtain more history and assist with MDM  This patient complains of right low abdominal pain, nausea, diarrhea x 1.   Chief complain involves an extensive number of treatment options and is a complaint that carries with it a high risk of complications and morbidity and mortality.    The differential diagnosis includes viral gastroenteritis, right sided diverticulitis, appendicitis.  She has no urinary or vaginal symptoms. No history of kidney stones. Doubt pelvic or GU process.    I ordered medications including antiemetic. She declined pain medicine.   I ordered laboratory studies including labs, urinalysis.    I ordered imaging studies including CTAP.   1421: ER work up personally visualized and interpreted.  Normal labs.  CTAP shows right sided colitis without complicating features.  Normal appendix.  This fits clinical picture.    Patient re-evaluated and no clinical decline.  Discussed CT findings.    Will dc with antibiotics, antiemetics.  Return precautions given.    Final Clinical Impression(s) / ED Diagnoses Final  diagnoses:  Colitis    Rx / DC Orders ED Discharge Orders         Ordered    ciprofloxacin (CIPRO) 500 MG tablet  Every 12 hours     Discontinue  Reprint     09/26/19 1415    metroNIDAZOLE (FLAGYL)  500 MG tablet  3 times daily     Discontinue  Reprint     09/26/19 1415    ondansetron (ZOFRAN ODT) 4 MG disintegrating tablet  Every 8 hours PRN     Discontinue  Reprint     09/26/19 1415           Arlean Hopping 09/26/19 1422    Isla Pence, MD 09/26/19 1440

## 2019-10-12 MED FILL — ESTRADIOL 0.025 MG PATCH: 0.025 | 28 days supply | Qty: 8 | Fill #0

## 2019-10-25 ENCOUNTER — Telehealth: Payer: Self-pay | Admitting: Family Medicine

## 2019-10-25 NOTE — Telephone Encounter (Signed)
Can this referral wait until after I see her?  It would be better for me to see what the problem is before I make a referral.    Thx, Sanuel Ladnier

## 2019-10-25 NOTE — Telephone Encounter (Signed)
Attempted to contact patient. Voicemail is full;unable to leave message °

## 2019-10-25 NOTE — Telephone Encounter (Signed)
Patient states she is fine to wait until her appointment. She has a few moles and 1 in particular she wants checked since her mother passed from [place].

## 2019-10-25 NOTE — Telephone Encounter (Signed)
Please advise. Thank you

## 2019-10-25 NOTE — Telephone Encounter (Signed)
Pt has scheduled a PHY with Santiago Glad on Monday 8/30 she is needing a referral to dermatology to Dr Tarri Glenn due to insurance.   Pt call back [phone]

## 2019-11-09 MED FILL — ESTRADIOL 0.025 MG PATCH: 0.025 | 28 days supply | Qty: 8 | Fill #1

## 2019-11-10 ENCOUNTER — Other Ambulatory Visit (HOSPITAL_COMMUNITY): Payer: Self-pay | Admitting: Family Medicine

## 2019-11-10 DIAGNOSIS — Z1231 Encounter for screening mammogram for malignant neoplasm of breast: Secondary | ICD-10-CM

## 2019-11-15 ENCOUNTER — Ambulatory Visit (INDEPENDENT_AMBULATORY_CARE_PROVIDER_SITE_OTHER): Payer: No Typology Code available for payment source | Admitting: Family Medicine

## 2019-11-15 ENCOUNTER — Ambulatory Visit (HOSPITAL_COMMUNITY): Payer: No Typology Code available for payment source

## 2019-11-15 ENCOUNTER — Other Ambulatory Visit: Payer: Self-pay

## 2019-11-15 ENCOUNTER — Encounter: Payer: Self-pay | Admitting: Family Medicine

## 2019-11-15 VITALS — BP 132/80 | HR 75 | Temp 97.0°F | Ht 64.0 in | Wt 144.4 lb

## 2019-11-15 DIAGNOSIS — Z0000 Encounter for general adult medical examination without abnormal findings: Secondary | ICD-10-CM | POA: Insufficient documentation

## 2019-11-15 NOTE — Patient Instructions (Signed)
We will make a derm referral for you.    Preventive Care 40-59 Years Old, Female   Preventive care refers to visits with your health care provider and lifestyle choices that can promote health and wellness. This includes:  A yearly physical exam. This may also be called an annual well check.  Regular dental visits and eye exams.  Immunizations.  Screening for certain conditions.  Healthy lifestyle choices, such as eating a healthy diet, getting regular exercise, not using drugs or products that contain nicotine and tobacco, and limiting alcohol use. What can I expect for my preventive care visit? Physical exam Your health care provider will check your:  Height and weight. This may be used to calculate body mass index (BMI), which tells if you are at a healthy weight.  Heart rate and blood pressure.  Skin for abnormal spots. Counseling Your health care provider may ask you questions about your:  Alcohol, tobacco, and drug use.  Emotional well-being.  Home and relationship well-being.  Sexual activity.  Eating habits.  Work and work Statistician.  Method of birth control.  Menstrual cycle.  Pregnancy history. What immunizations do I need?  Influenza (flu) vaccine  This is recommended every year. Tetanus, diphtheria, and pertussis (Tdap) vaccine  You may need a Td booster every 10 years. Varicella (chickenpox) vaccine  You may need this if you have not been vaccinated. Zoster (shingles) vaccine  You may need this after age 14. Measles, mumps, and rubella (MMR) vaccine  You may need at least one dose of MMR if you were born in 1957 or later. You may also need a second dose. Pneumococcal conjugate (PCV13) vaccine  You may need this if you have certain conditions and were not previously vaccinated. Pneumococcal polysaccharide (PPSV23) vaccine  You may need one or two doses if you smoke cigarettes or if you have certain conditions. Meningococcal conjugate  (MenACWY) vaccine  You may need this if you have certain conditions. Hepatitis A vaccine  You may need this if you have certain conditions or if you travel or work in places where you may be exposed to hepatitis A. Hepatitis B vaccine  You may need this if you have certain conditions or if you travel or work in places where you may be exposed to hepatitis B. Haemophilus influenzae type b (Hib) vaccine  You may need this if you have certain conditions. Human papillomavirus (HPV) vaccine  If recommended by your health care provider, you may need three doses over 6 months. You may receive vaccines as individual doses or as more than one vaccine together in one shot (combination vaccines). Talk with your health care provider about the risks and benefits of combination vaccines. What tests do I need? Blood tests  Lipid and cholesterol levels. These may be checked every 5 years, or more frequently if you are over 22 years old.  Hepatitis C test.  Hepatitis B test. Screening  Lung cancer screening. You may have this screening every year starting at age 1 if you have a 30-pack-year history of smoking and currently smoke or have quit within the past 15 years.  Colorectal cancer screening. All adults should have this screening starting at age 64 and continuing until age 76. Your health care provider may recommend screening at age 71 if you are at increased risk. You will have tests every 1-10 years, depending on your results and the type of screening test.  Diabetes screening. This is done by checking your blood sugar (glucose)  after you have not eaten for a while (fasting). You may have this done every 1-3 years.  Mammogram. This may be done every 1-2 years. Talk with your health care provider about when you should start having regular mammograms. This may depend on whether you have a family history of breast cancer.  BRCA-related cancer screening. This may be done if you have a family  history of breast, ovarian, tubal, or peritoneal cancers.  Pelvic exam and Pap test. This may be done every 3 years starting at age 45. Starting at age 30, this may be done every 5 years if you have a Pap test in combination with an HPV test. Other tests  Sexually transmitted disease (STD) testing.  Bone density scan. This is done to screen for osteoporosis. You may have this scan if you are at high risk for osteoporosis. Follow these instructions at home: Eating and drinking  Eat a diet that includes fresh fruits and vegetables, whole grains, lean protein, and low-fat dairy.  Take vitamin and mineral supplements as recommended by your health care provider.  Do not drink alcohol if: ? Your health care provider tells you not to drink. ? You are pregnant, may be pregnant, or are planning to become pregnant.  If you drink alcohol: ? Limit how much you have to 0-1 drink a day. ? Be aware of how much alcohol is in your drink. In the [place] one drink equals one 12 oz bottle of beer (355 mL), one 5 oz glass of wine (148 mL), or one 1 oz glass of hard liquor (44 mL). Lifestyle  Take daily care of your teeth and gums.  Stay active. Exercise for at least 30 minutes on 5 or more days each week.  Do not use any products that contain nicotine or tobacco, such as cigarettes, e-cigarettes, and chewing tobacco. If you need help quitting, ask your health care provider.  If you are sexually active, practice safe sex. Use a condom or other form of birth control (contraception) in order to prevent pregnancy and STIs (sexually transmitted infections).  If told by your health care provider, take low-dose aspirin daily starting at age 55. What's next?  Visit your health care provider once a year for a well check visit.  Ask your health care provider how often you should have your eyes and teeth checked.  Stay up to date on all vaccines. This information is not intended to replace advice given to you  by your health care provider. Make sure you discuss any questions you have with your health care provider. Document Revised: 11/13/2017 Document Reviewed: 11/13/2017 Elsevier Patient Education  2020 [employer].

## 2019-11-15 NOTE — Progress Notes (Signed)
The patient comes in today for a wellness visit.    A review of their health history was completed.  A review of medications was also completed.  Any needed refills; none  Eating habits: trying to eat healthy   Falls/  MVA accidents in past few months: none  Regular exercise: some, not every day   Specialist pt sees on regular basis: none  Preventative health issues were discussed.      Patient ID: Natalie Hess, female    DOB: [birth year], 59 y.o.   MRN: [id number]   Chief Complaint  Patient presents with  . Annual Exam   Subjective:    HPI   Wellness exam.  Additional concerns: dermatologist; pt mom passed away from [place]. Pt has a few areas of concerns. [name] in [place] has a past medical history of Medical history non-contributory and PONV (postoperative nausea and vomiting).   Outpatient Encounter Medications as of 11/15/2019  Medication Sig  . aspirin-acetaminophen-caffeine (EXCEDRIN MIGRAINE) 250-250-65 MG tablet Take 2 tablets by mouth every 6 (six) hours as needed (for pain.).  Marland Kitchen estradiol (VIVELLE-DOT) 0.025 MG/24HR PLACE 1 PATCH ONTO THE SKIN TWICE A WEEK. DO NOT APPLY TO THE BREAST AREA.  Marland Kitchen ibuprofen (ADVIL,MOTRIN) 800 MG tablet Take 1 tablet (800 mg total) by mouth every 8 (eight) hours as needed.  . [DISCONTINUED] ondansetron (ZOFRAN ODT) 4 MG disintegrating tablet Take 1 tablet (4 mg total) by mouth every 8 (eight) hours as needed for nausea or vomiting.   No facility-administered encounter medications on file as of 11/15/2019.     Review of Systems  Constitutional: Negative.   HENT: Negative.   Respiratory: Negative.   Cardiovascular: Negative.   Gastrointestinal: Negative.   Endocrine: Negative.   Genitourinary: Negative.   Musculoskeletal: Negative.   Skin:       Family history of melanoma - wants derm referral.     Vitals BP 132/80   Pulse 75   Temp (!) 97 F (36.1 C)   Ht 5\' 4"  (1.626 m)   Wt 144 lb 6.4  oz (65.5 kg)   SpO2 100%   BMI 24.79 kg/m   Objective:   Physical Exam Vitals and nursing note reviewed. Exam conducted with a chaperone present.  Constitutional:      Appearance: Normal appearance.  Cardiovascular:     Rate and Rhythm: Normal rate and regular rhythm.     Pulses: Normal pulses.     Heart sounds: Normal heart sounds.  Pulmonary:     Effort: Pulmonary effort is normal.     Breath sounds: Normal breath sounds.  Chest:     Chest wall: No mass or tenderness.     Breasts:        Right: No mass, nipple discharge, skin change or tenderness.        Left: No mass, nipple discharge, skin change or tenderness.  Abdominal:     General: Bowel sounds are normal.     Palpations: Abdomen is soft.     Tenderness: There is no abdominal tenderness.  Genitourinary:    General: Normal vulva.     Exam position: Lithotomy position.     Vagina: No vaginal discharge.     Comments: External GU: no rashes or lesions. Vagina: no discharge; tissue pink in color. Bimanual exam: no tenderness or obvious masses.  Musculoskeletal:        General: Normal range of motion.  Lymphadenopathy:     Upper Body:  Right upper body: No axillary adenopathy.     Left upper body: No axillary adenopathy.  Skin:    General: Skin is warm and dry.  Neurological:     Mental Status: She is alert and oriented to person, place, and time.  Psychiatric:        Mood and Affect: Mood normal.        Behavior: Behavior normal.      Assessment and Plan   1. Wellness examination - Ambulatory referral to Dermatology   Natalie Hess presents today for wellness exam. Her only concern is skin cancer  Risk. She will get a derm referral today for full body assessment due to her family history. Her mammogram is scheduled for September 2021. She is up to date on her colonoscopy.   Latest labs drawn in July: all normal.  No meds due to be refilled: last done June 2021.  Agrees with plan of care discussed  today. Understands warning signs to seek further care: any changes in health that cause her concern. Understands to follow-up in one year, sooner if needed. She will go to  derm for a full body exam.   Chalmers Guest, NP 11/15/2019

## 2019-11-25 ENCOUNTER — Ambulatory Visit (HOSPITAL_COMMUNITY): Payer: No Typology Code available for payment source

## 2019-11-26 ENCOUNTER — Other Ambulatory Visit: Payer: Self-pay

## 2019-11-26 ENCOUNTER — Ambulatory Visit (HOSPITAL_COMMUNITY)
Admission: RE | Admit: 2019-11-26 | Discharge: 2019-11-26 | Disposition: A | Discharge status: Home / Self Care | Payer: No Typology Code available for payment source | Source: Ambulatory Visit | Attending: Family Medicine | Admitting: Family Medicine

## 2019-11-26 DIAGNOSIS — Z1231 Encounter for screening mammogram for malignant neoplasm of breast: Secondary | ICD-10-CM | POA: Diagnosis present

## 2019-12-30 MED FILL — ESTRADIOL 0.025 MG PATCH: 0.025 | 28 days supply | Qty: 8 | Fill #2

## 2020-02-07 ENCOUNTER — Other Ambulatory Visit: Payer: Self-pay | Admitting: Nurse Practitioner

## 2020-02-10 ENCOUNTER — Other Ambulatory Visit: Payer: Self-pay | Admitting: Nurse Practitioner

## 2020-02-11 MED FILL — ESTRADIOL 0.025 MG PATCH: 0.025 | 28 days supply | Qty: 8 | Fill #0

## 2020-03-23 ENCOUNTER — Other Ambulatory Visit: Payer: Self-pay | Admitting: Family Medicine

## 2020-03-23 DIAGNOSIS — L259 Unspecified contact dermatitis, unspecified cause: Secondary | ICD-10-CM

## 2020-03-23 MED ORDER — METHYLPREDNISOLONE 4 MG PO TBPK: ORAL_TABLET | ORAL | 0 refills | Status: DC

## 2020-04-01 ENCOUNTER — Other Ambulatory Visit: Payer: No Typology Code available for payment source

## 2020-04-01 DIAGNOSIS — Z20822 Contact with and (suspected) exposure to covid-19: Secondary | ICD-10-CM

## 2020-04-04 LAB — NOVEL CORONAVIRUS, NAA: SARS-CoV-2, NAA: DETECTED — AB

## 2020-04-06 ENCOUNTER — Telehealth (INDEPENDENT_AMBULATORY_CARE_PROVIDER_SITE_OTHER): Payer: No Typology Code available for payment source | Admitting: Family Medicine

## 2020-04-06 ENCOUNTER — Telehealth: Payer: Self-pay | Admitting: Family Medicine

## 2020-04-06 ENCOUNTER — Encounter: Payer: Self-pay | Admitting: Family Medicine

## 2020-04-06 DIAGNOSIS — U071 COVID-19: Secondary | ICD-10-CM

## 2020-04-06 DIAGNOSIS — J019 Acute sinusitis, unspecified: Secondary | ICD-10-CM | POA: Diagnosis not present

## 2020-04-06 DIAGNOSIS — B9689 Other specified bacterial agents as the cause of diseases classified elsewhere: Secondary | ICD-10-CM

## 2020-04-06 MED ORDER — FLUTICASONE PROPIONATE 50 MCG/ACT NA SUSP: 2.0000 | Freq: Every day | NASAL | 6 refills | Status: DC

## 2020-04-06 MED ORDER — AMOXICILLIN-POT CLAVULANATE 875-125 MG PO TABS: 1.0000 | ORAL_TABLET | Freq: Two times a day (BID) | ORAL | 0 refills | Status: DC

## 2020-04-06 NOTE — Progress Notes (Signed)
Pt tested positive for COVID on Saturday. Pt went to [employer] (in front of [employer]). Pt states she is unable to get sinuses to drain and feeling dizzy headed. Pt states she has tried several OTC meds but no help.  Virtual Visit via Telephone Note  I connected with Karin Lieu on 04/06/20 at 11:00 AM EST by telephone and verified that I am speaking with the correct person using two identifiers.  Location: Patient: home Provider: office   I discussed the limitations, risks, security and privacy concerns of performing an evaluation and management service by telephone and the availability of in person appointments. I also discussed with the patient that there may be a patient responsible charge related to this service. The patient expressed understanding and agreed to proceed.   History of Present Illness:    Observations/Objective:   Assessment and Plan:   Follow Up Instructions:    I discussed the assessment and treatment plan with the patient. The patient was provided an opportunity to ask questions and all were answered. The patient agreed with the plan and demonstrated an understanding of the instructions.   The patient was advised to call back or seek an in-person evaluation if the symptoms worsen or if the condition fails to improve as anticipated.  I provided  12 minutes of non-face-to-face time during this encounter.    Patient ID: Natalie Hess, female    DOB: [birth year], 60 y.o.   MRN: [id number]   Chief Complaint  Patient presents with  . Covid Positive   Subjective:  CC: positive covid, sinus drainage and feeling off when walking   This is a new problem.  Presents today via telephone visit with a complaint of testing positive for COVID on Saturday.  Symptoms started on Friday.  Associated symptoms include cough which has resolved, ear pain, sinus pain and pressure and fatigue.  Denies fever, chills, chest pain, shortness of breath.  Believes she has a  sinus infection.    Medical History Lamanda has a past medical history of Medical history non-contributory and PONV (postoperative nausea and vomiting).   Outpatient Encounter Medications as of 04/06/2020  Medication Sig  . amoxicillin-clavulanate (AUGMENTIN) 875-125 MG tablet Take 1 tablet by mouth 2 (two) times daily.  . fluticasone (FLONASE) 50 MCG/ACT nasal spray Place 2 sprays into both nostrils daily.  Marland Kitchen aspirin-acetaminophen-caffeine (EXCEDRIN MIGRAINE) 250-250-65 MG tablet Take 2 tablets by mouth every 6 (six) hours as needed (for pain.).  Marland Kitchen estradiol (VIVELLE-DOT) 0.025 MG/24HR PLACE 1 PATCH ONTO THE SKIN TWICE A WEEK. DO NOT APPLY TO THE BREAST AREA.  Marland Kitchen ibuprofen (ADVIL,MOTRIN) 800 MG tablet Take 1 tablet (800 mg total) by mouth every 8 (eight) hours as needed.  . [DISCONTINUED] methylPREDNISolone (MEDROL DOSEPAK) 4 MG TBPK tablet Take as directed   No facility-administered encounter medications on file as of 04/06/2020.     Review of Systems  Constitutional: Positive for fatigue. Negative for chills and fever.  HENT: Positive for congestion, ear pain, postnasal drip, sinus pressure and sinus pain. Negative for sore throat.   Respiratory: Positive for cough. Negative for shortness of breath.   Cardiovascular: Negative for chest pain.  Gastrointestinal: Negative for abdominal pain, diarrhea, nausea and vomiting.  Genitourinary: Negative for dysuria.  Neurological: Positive for headaches.       IMPROVED- DULL NOW     Vitals There were no vitals taken for this visit. Does not have oxygen saturation meter. Objective:   Physical Exam  Able to  converse throughout telephone visit without any obvious shortness of breath. Assessment and Plan   1. COVID-19 virus infection - fluticasone (FLONASE) 50 MCG/ACT nasal spray; Place 2 sprays into both nostrils daily.  Dispense: 16 g; Refill: 6  2. Acute bacterial rhinosinusitis - amoxicillin-clavulanate (AUGMENTIN) 875-125 MG  tablet; Take 1 tablet by mouth 2 (two) times daily.  Dispense: 20 tablet; Refill: 0   Will treat sinus infection with antibiotic for 10 days.  Discussion concerning viral versus bacterial infections, it is possible this is a viral infection and must run its course, therefore, the antibiotic may not be helpful.  Recommend supportive therapy, humidification, saline nasal flushes, she will start to use her Flonase along with the Augmentin.  She is not experiencing a cough or shortness of breath, declines cough medication and albuterol inhaler at this time.  Agrees with plan of care discussed today. Understands warning signs to seek further care: chest pain, shortness of breath, any significant change in health.  Understands to follow-up if symptoms worsen, do not improve, COVID warning as stated below.  Covid-19 warning:  Covid-19 is a virus that causes hypoxia (low oxygen level in blood) in some people. If you develop any changes in your usual breathing pattern: difficulty catching your breath, more short winded with activity or with resting, or anything that concerns you about your breathing, do not hesitate to go to the emergency department immediately for evaluation. Covid infection can also affect the way the brain functions if it lacks oxygen, such as, feeling dizzy, passing out, or feeling confused, if you experience any of these symptoms, please do not delay to seek treatment.  Some people experience gastrointestinal problems with Covid, such as vomiting and diarrhea, dehydration is a serious risk and should be avoided. If you are unable to keep liquids down you may need to go to the emergency department for intravenous fluids to avoid dehydration.   Please alert and involve your family and/or friends to help keep an eye on you while you recover from Covid-19. If you have any questions or concerns about your recovery, please do not hesitate to call the office for guidance.    Recommend  supportive therapy while you are recovering:   1) Get lots of rest.  2) Take over the counter pain medication if needed, such as acetaminophen or ibuprofen. Read and follow instructions on the label and make sure not to combine other medications that may have same ingredients in it. It is important to not take too much of these ingredients.  3) Drink plenty of caffeine-free fluids. (If you have heart or kidney problems, follow the instructions of your specialist regarding amounts).  4) If you are hungry, eat a bland diet, such as the BRAT diet (bananas, rice, applesauce, toast).  5) Let us know if you are not feeling better in a week.   Covid-19 Quarantine Instructions:   You have tested positive for Covid-19 infection. The current CDC guidelines for quarantine regardless of vaccination status are:   Please quarantine and isolate at home for a minimum of  5 days.   - If you have no symptoms or your symptoms are resolving after 5 days you   can leave the home (resolving means no shortness of breath, no fever, without taking fever reducing medication, no headache, etc). -Continue to wear a mask around others for an additional 5 days.  -If you were severely ill with Covid-19 you should isolate for at least 10 days.    Use  over-the-counter medications for symptoms.If you develop respiratory issues/distress (see Covid warning), seek medical care in the Emergency Department.  If you must leave home or if you have to be around others please wear a mask. Please limit contact with immediate family members in the home, practice social distancing, frequent handwashing and clean hard surfaces touched frequently with household cleaning products. Members of your household will also need to quarantine for 5 days and test on day five if possible.  Covid-19 warning:  Covid-19 is a virus that causes hypoxia (low oxygen level in blood) in some people. If you develop any changes in your usual breathing  pattern: difficulty catching your breath, more short winded with activity or with resting, or anything that concerns you about your breathing, do not hesitate to go to the emergency department immediately for evaluation. Covid infection can also affect the way the brain functions if it lacks oxygen, such as, feeling dizzy, passing out, or feeling confused, if you experience any of these symptoms, please do not delay to seek treatment.  Some people experience gastrointestinal problems with Covid, such as vomiting and diarrhea, dehydration is a serious risk and should be avoided. If you are unable to keep liquids down you may need to go to the emergency department for intravenous fluids to avoid dehydration.   Please alert and involve your family and/or friends to help keep an eye on you while you recover from Covid-19. If you have any questions or concerns about your recovery, please do not hesitate to call the office for guidance.   She has a Adult nurse, awaiting health at work to discuss quarantine with her.    Chalmers Guest, NP 04/06/2020

## 2020-04-06 NOTE — Telephone Encounter (Signed)
Ms. Natalie Hess, Natalie Hess are scheduled for a virtual visit with your provider today.    Just as we do with appointments in the office, we must obtain your consent to participate.  Your consent will be active for this visit and any virtual visit you may have with one of our providers in the next 365 days.    If you have a MyChart account, I can also send a copy of this consent to you electronically.  All virtual visits are billed to your insurance company just like a traditional visit in the office.  As this is a virtual visit, video technology does not allow for your provider to perform a traditional examination.  This may limit your provider's ability to fully assess your condition.  If your provider identifies any concerns that need to be evaluated in person or the need to arrange testing such as labs, EKG, etc, we will make arrangements to do so.    Although advances in technology are sophisticated, we cannot ensure that it will always work on either your end or our end.  If the connection with a video visit is poor, we may have to switch to a telephone visit.  With either a video or telephone visit, we are not always able to ensure that we have a secure connection.   I need to obtain your verbal consent now.   Are you willing to proceed with your visit today?   Natalie Hess has provided verbal consent on 04/06/2020 for a virtual visit (video or telephone).   Vicente Males, LPN 08/22/3708  6:26 AM

## 2020-04-16 MED FILL — ESTRADIOL 0.025 MG PATCH: 0.025 | 28 days supply | Qty: 8 | Fill #1

## 2020-04-24 ENCOUNTER — Ambulatory Visit: Payer: No Typology Code available for payment source | Admitting: Dermatology

## 2020-05-15 ENCOUNTER — Encounter: Payer: Self-pay | Admitting: Family Medicine

## 2020-05-15 ENCOUNTER — Other Ambulatory Visit: Payer: Self-pay

## 2020-05-15 ENCOUNTER — Ambulatory Visit (INDEPENDENT_AMBULATORY_CARE_PROVIDER_SITE_OTHER): Payer: No Typology Code available for payment source | Admitting: Family Medicine

## 2020-05-15 VITALS — BP 136/88 | HR 81 | Temp 97.0°F | Ht 64.0 in | Wt 144.8 lb

## 2020-05-15 DIAGNOSIS — R079 Chest pain, unspecified: Secondary | ICD-10-CM | POA: Diagnosis not present

## 2020-05-15 DIAGNOSIS — Z8616 Personal history of COVID-19: Secondary | ICD-10-CM

## 2020-05-15 DIAGNOSIS — R55 Syncope and collapse: Secondary | ICD-10-CM

## 2020-05-15 DIAGNOSIS — R42 Dizziness and giddiness: Secondary | ICD-10-CM

## 2020-05-15 DIAGNOSIS — R5383 Other fatigue: Secondary | ICD-10-CM | POA: Diagnosis not present

## 2020-05-15 NOTE — Progress Notes (Signed)
Patient ID: Natalie Hess, female    DOB: 1960/05/16, 60 y.o.   MRN: [id number]   Chief Complaint  Patient presents with  . Dizziness   Subjective:    HPI   Pt here due to "not feeling right after COVID." Pt states that she is about 6 weeks post COVID and is still not feeling right. Pt states before COVID she felt weird. Works in [place] and almost passed out. Last week became dizzy headed. Pt states she has been having ongoing sinus issues.   Sat in floor at work, 2x in past week due to feeling she was about to pass out.   Having some sinus congestion since having covid.  Just finished a dose of azithromycin.    Pt stating not feeling like herself.  More fatigued.  No having palp or racing heart rate. Not often noticing it. No coughing now  Had chest pain in January.  Was sitting in tree stand in center of chest.  Not felt like heart burn. Unable to explain it. Resolved in minutes.  Had some weight gain in past year.   Wt Readings from Last 3 Encounters:  05/15/20 144 lb 12.8 oz (65.7 kg)  11/15/19 144 lb 6.4 oz (65.5 kg)  09/26/19 135 lb (61.2 kg)   had acl tear and was sitting more in past year.   Medical History Rosalee has a past medical history of Medical history non-contributory and PONV (postoperative nausea and vomiting).   Outpatient Encounter Medications as of 05/15/2020  Medication Sig  . aspirin-acetaminophen-caffeine (EXCEDRIN MIGRAINE) 250-250-65 MG tablet Take 2 tablets by mouth every 6 (six) hours as needed (for pain.).  Marland Kitchen estradiol (VIVELLE-DOT) 0.025 MG/24HR PLACE 1 PATCH ONTO THE SKIN TWICE A WEEK. DO NOT APPLY TO THE BREAST AREA.  . fluticasone (FLONASE) 50 MCG/ACT nasal spray Place 2 sprays into both nostrils daily.  Marland Kitchen ibuprofen (ADVIL,MOTRIN) 800 MG tablet Take 1 tablet (800 mg total) by mouth every 8 (eight) hours as needed.  . [DISCONTINUED] amoxicillin-clavulanate (AUGMENTIN) 875-125 MG tablet Take 1 tablet by mouth 2 (two) times daily.   No  facility-administered encounter medications on file as of 05/15/2020.     Review of Systems  Constitutional: Positive for fatigue. Negative for chills and fever.  HENT: Negative for congestion, rhinorrhea and sore throat.   Respiratory: Negative for cough, shortness of breath and wheezing.   Cardiovascular: Positive for chest pain. Negative for leg swelling.  Gastrointestinal: Negative for abdominal pain, diarrhea, nausea and vomiting.  Genitourinary: Negative for dysuria and frequency.  Musculoskeletal: Negative for arthralgias and back pain.  Skin: Negative for rash.  Neurological: Positive for dizziness. Negative for weakness and headaches.     Vitals BP 136/88   Pulse 81   Temp (!) 97 F (36.1 C)   Ht 5\' 4"  (1.626 m)   Wt 144 lb 12.8 oz (65.7 kg)   SpO2 98%   BMI 24.85 kg/m   Objective:   Physical Exam Vitals and nursing note reviewed.  Constitutional:      Appearance: Normal appearance.  HENT:     Head: Normocephalic and atraumatic.     Nose: Nose normal.     Mouth/Throat:     Mouth: Mucous membranes are moist.     Pharynx: Oropharynx is clear.  Eyes:     Extraocular Movements: Extraocular movements intact.     Conjunctiva/sclera: Conjunctivae normal.     Pupils: Pupils are equal, round, and reactive to light.  Cardiovascular:  Rate and Rhythm: Normal rate and regular rhythm.     Pulses: Normal pulses.     Heart sounds: Normal heart sounds. No murmur heard.   Pulmonary:     Effort: Pulmonary effort is normal. No respiratory distress.     Breath sounds: Normal breath sounds. No wheezing, rhonchi or rales.  Musculoskeletal:        General: Normal range of motion.     Right lower leg: No edema.     Left lower leg: No edema.  Skin:    General: Skin is warm and dry.     Findings: No lesion or rash.  Neurological:     General: No focal deficit present.     Mental Status: She is alert and oriented to person, place, and time.  Psychiatric:        Mood  and Affect: Mood normal.        Behavior: Behavior normal.    Assessment and Plan   1. Chest pain, unspecified type - EKG 12-Lead  2. Dizziness  3. Near syncope  4. Fatigue, unspecified type  5. History of COVID-19   Chest pain/dizziness- EKG ordered. EKG- normal, no st elevation or ischemic changes.  Advised to go to ER if worsening chest pain.  Continue to hydrate and eat regularly. Call or rto if more near syncope episodes or not improving.  If continuing to have intermittent chest pain may refer to cardiology for stress testing.  Return if symptoms worsen or fail to improve.

## 2020-05-24 ENCOUNTER — Telehealth: Payer: Self-pay | Admitting: Family Medicine

## 2020-05-25 NOTE — Telephone Encounter (Signed)
Patient states she is still feeling tired but hasn't had any further dizzy spells or feeling like she is going to pass out

## 2020-05-25 NOTE — Telephone Encounter (Signed)
Ok glad she is feeling better. If any changes to call or rto.   Thx.   Dr. Lovena Le

## 2020-05-25 NOTE — Telephone Encounter (Signed)
Tried to call no answer

## 2020-05-25 NOTE — Telephone Encounter (Signed)
Patient notified and verbalized understanding. 

## 2020-05-25 NOTE — Telephone Encounter (Signed)
Left message to return call 

## 2020-06-06 ENCOUNTER — Other Ambulatory Visit (HOSPITAL_BASED_OUTPATIENT_CLINIC_OR_DEPARTMENT_OTHER): Payer: Self-pay

## 2020-06-17 ENCOUNTER — Other Ambulatory Visit (HOSPITAL_COMMUNITY): Payer: Self-pay

## 2020-06-17 MED FILL — Estradiol TD Patch Twice Weekly 0.025 MG/24HR: TRANSDERMAL | 28 days supply | Qty: 8 | Fill #0 | Status: AC

## 2020-06-18 ENCOUNTER — Other Ambulatory Visit (HOSPITAL_COMMUNITY): Payer: Self-pay

## 2020-06-19 ENCOUNTER — Other Ambulatory Visit (HOSPITAL_COMMUNITY): Payer: Self-pay

## 2020-07-10 ENCOUNTER — Telehealth: Payer: Self-pay | Admitting: Family Medicine

## 2020-07-10 NOTE — Telephone Encounter (Signed)
Left message to return call 

## 2020-07-10 NOTE — Telephone Encounter (Signed)
Patient scheduled office visit with Dr Lovena Le to discuss.

## 2020-07-10 NOTE — Telephone Encounter (Signed)
Pt likely needs appt, since last visit discussion about post covid and chest pain.   Will need notes about "recurrent sinus infections."  Dr. Lovena Le

## 2020-07-10 NOTE — Telephone Encounter (Signed)
Patient is requesting a referral to ENT because of recurring sinus infections.

## 2020-07-20 ENCOUNTER — Encounter: Payer: Self-pay | Admitting: Family Medicine

## 2020-07-20 ENCOUNTER — Other Ambulatory Visit: Payer: Self-pay

## 2020-07-20 ENCOUNTER — Ambulatory Visit (INDEPENDENT_AMBULATORY_CARE_PROVIDER_SITE_OTHER): Payer: No Typology Code available for payment source | Admitting: Family Medicine

## 2020-07-20 ENCOUNTER — Other Ambulatory Visit (HOSPITAL_COMMUNITY): Payer: Self-pay

## 2020-07-20 VITALS — BP 127/85 | HR 75 | Temp 98.4°F | Ht 64.0 in | Wt 143.4 lb

## 2020-07-20 DIAGNOSIS — R431 Parosmia: Secondary | ICD-10-CM

## 2020-07-20 DIAGNOSIS — J0100 Acute maxillary sinusitis, unspecified: Secondary | ICD-10-CM

## 2020-07-20 DIAGNOSIS — L509 Urticaria, unspecified: Secondary | ICD-10-CM

## 2020-07-20 MED ORDER — TRIAMCINOLONE ACETONIDE 0.1 % EX CREA
1.0000 "application " | TOPICAL_CREAM | Freq: Two times a day (BID) | CUTANEOUS | 0 refills | Status: DC
  Filled 2020-07-20: qty 60, 30d supply, fill #0

## 2020-07-20 MED ORDER — AMOXICILLIN-POT CLAVULANATE 875-125 MG PO TABS
1.0000 | ORAL_TABLET | Freq: Two times a day (BID) | ORAL | 0 refills | Status: DC
Start: 2020-07-20 — End: 2020-08-17

## 2020-07-20 MED ORDER — AMOXICILLIN-POT CLAVULANATE 875-125 MG PO TABS
1.0000 | ORAL_TABLET | Freq: Two times a day (BID) | ORAL | 0 refills | Status: DC
  Filled 2020-07-20: qty 20, 10d supply, fill #0

## 2020-07-20 MED ORDER — TRIAMCINOLONE ACETONIDE 0.1 % EX CREA: 1.0000 "application " | TOPICAL_CREAM | Freq: Two times a day (BID) | CUTANEOUS | 0 refills | Status: DC

## 2020-07-20 NOTE — Progress Notes (Signed)
Patient ID: Natalie Hess, female    DOB: May 11, 1960, 60 y.o.   MRN: [id number]   Chief Complaint  Patient presents with  . Sinus Problem   Subjective:    HPI  Pt having concern of sinuses and here to get referral to ENT. Pt states her sinuses never clear up and she can smell her own sinuses.   Feeling she is smelling an abnormal odor in nose since 2/22. Sore on left tip of nare, last 2 wks. Pt also having some snoring, pt stating this is new.  Nothing coming out.   Feeling breathing normally. Had covid in Jan '22. Chest pain cleared up after last visit in 2/22. Used flonase, gel with the netti pot.  Tried saline in sinus rinses.   Last 2 wks has small ulcer on nare on left.   Medical History Arley has a past medical history of Medical history non-contributory and PONV (postoperative nausea and vomiting).   Outpatient Encounter Medications as of 07/20/2020  Medication Sig  . aspirin-acetaminophen-caffeine (EXCEDRIN MIGRAINE) 250-250-65 MG tablet Take 2 tablets by mouth every 6 (six) hours as needed (for pain.).  Marland Kitchen estradiol (VIVELLE-DOT) 0.025 MG/24HR PLACE 1 PATCH ONTO THE SKIN TWICE A WEEK. DO NOT APPLY TO THE BREAST AREA.  . fluticasone (FLONASE) 50 MCG/ACT nasal spray Place 2 sprays into both nostrils daily.  Marland Kitchen ibuprofen (ADVIL,MOTRIN) 800 MG tablet Take 1 tablet (800 mg total) by mouth every 8 (eight) hours as needed.  . [DISCONTINUED] amoxicillin-clavulanate (AUGMENTIN) 875-125 MG tablet Take 1 tablet by mouth 2 (two) times daily.  . [DISCONTINUED] triamcinolone cream (KENALOG) 0.1 % Apply 1 application topically 2 (two) times daily.  Marland Kitchen amoxicillin-clavulanate (AUGMENTIN) 875-125 MG tablet Take 1 tablet by mouth 2 (two) times daily.  Marland Kitchen triamcinolone cream (KENALOG) 0.1 % Apply 1 application topically 2 (two) times daily.   No facility-administered encounter medications on file as of 07/20/2020.     Review of Systems  Constitutional: Negative for chills and fever.   HENT: Positive for congestion. Negative for rhinorrhea, sinus pressure, sinus pain and sore throat.        +odor in sinuses  Respiratory: Negative for cough, shortness of breath and wheezing.   Cardiovascular: Negative for chest pain and leg swelling.  Gastrointestinal: Negative for abdominal pain, diarrhea, nausea and vomiting.  Genitourinary: Negative for dysuria and frequency.  Musculoskeletal: Negative for arthralgias and back pain.  Skin: Negative for rash.  Neurological: Negative for dizziness, weakness and headaches.     Vitals BP 127/85   Pulse 75   Temp 98.4 F (36.9 C)   Ht 5\' 4"  (1.626 m)   Wt 143 lb 6.4 oz (65 kg)   SpO2 98%   BMI 24.61 kg/m   Objective:   Physical Exam Vitals and nursing note reviewed.  Constitutional:      General: She is not in acute distress.    Appearance: Normal appearance. She is not ill-appearing or toxic-appearing.  HENT:     Head: Normocephalic and atraumatic.     Right Ear: Tympanic membrane, ear canal and external ear normal.     Left Ear: Tympanic membrane, ear canal and external ear normal.     Nose: Nose normal. No congestion or rhinorrhea.     Mouth/Throat:     Mouth: Mucous membranes are moist.     Pharynx: Oropharynx is clear. No oropharyngeal exudate or posterior oropharyngeal erythema.  Eyes:     Extraocular Movements: Extraocular movements intact.  Conjunctiva/sclera: Conjunctivae normal.     Pupils: Pupils are equal, round, and reactive to light.  Cardiovascular:     Rate and Rhythm: Normal rate and regular rhythm.     Pulses: Normal pulses.     Heart sounds: Normal heart sounds.  Pulmonary:     Effort: Pulmonary effort is normal. No respiratory distress.     Breath sounds: Normal breath sounds. No wheezing, rhonchi or rales.  Musculoskeletal:     Cervical back: Normal range of motion.  Lymphadenopathy:     Cervical: No cervical adenopathy.  Skin:    General: Skin is warm and dry.     Findings: No rash.   Neurological:     Mental Status: She is alert and oriented to person, place, and time.  Psychiatric:        Mood and Affect: Mood normal.        Behavior: Behavior normal.      Assessment and Plan   1. Acute non-recurrent maxillary sinusitis - Ambulatory referral to ENT - amoxicillin-clavulanate (AUGMENTIN) 875-125 MG tablet; Take 1 tablet by mouth 2 (two) times daily.  Dispense: 20 tablet; Refill: 0  2. Abnormal smell - Ambulatory referral to ENT  3. Urticaria - triamcinolone cream (KENALOG) 0.1 %; Apply 1 application topically 2 (two) times daily.  Dispense: 60 g; Refill: 0    Treating pt's sinusitis with augmentin and if still continuing to have abnormal smell in nasal passages then pt was given referral to ENT.  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  Using triamcinolone prn.  Return if symptoms worsen or fail to improve.

## 2020-07-30 ENCOUNTER — Encounter: Payer: Self-pay | Admitting: Family Medicine

## 2020-07-31 ENCOUNTER — Telehealth: Payer: Self-pay

## 2020-07-31 NOTE — Telephone Encounter (Signed)
Patient called in to check on referral to ENT.  She said that she doesn't want to see Dr. Benjamine Mola.  Would prefer [facility] and would like a new referral placed for there.

## 2020-08-16 DIAGNOSIS — R439 Unspecified disturbances of smell and taste: Secondary | ICD-10-CM | POA: Insufficient documentation

## 2020-08-17 ENCOUNTER — Other Ambulatory Visit: Payer: Self-pay

## 2020-08-17 ENCOUNTER — Ambulatory Visit (INDEPENDENT_AMBULATORY_CARE_PROVIDER_SITE_OTHER): Payer: No Typology Code available for payment source | Admitting: Orthopedic Surgery

## 2020-08-17 ENCOUNTER — Encounter: Payer: Self-pay | Admitting: Orthopedic Surgery

## 2020-08-17 ENCOUNTER — Emergency Department (HOSPITAL_COMMUNITY)
Admission: EM | Admit: 2020-08-17 | Discharge: 2020-08-17 | Disposition: A | Discharge status: Home / Self Care | Payer: No Typology Code available for payment source | Attending: Emergency Medicine | Admitting: Emergency Medicine

## 2020-08-17 ENCOUNTER — Encounter (HOSPITAL_COMMUNITY): Payer: Self-pay | Admitting: Emergency Medicine

## 2020-08-17 ENCOUNTER — Emergency Department (HOSPITAL_COMMUNITY)
Admission: RE | Admit: 2020-08-17 | Discharge: 2020-08-17 | Disposition: A | Discharge status: Home / Self Care | Payer: No Typology Code available for payment source | Source: Ambulatory Visit | Attending: Orthopedic Surgery | Admitting: Orthopedic Surgery

## 2020-08-17 ENCOUNTER — Telehealth: Payer: Self-pay | Admitting: Family Medicine

## 2020-08-17 ENCOUNTER — Emergency Department (HOSPITAL_COMMUNITY): Payer: No Typology Code available for payment source

## 2020-08-17 VITALS — BP 153/95 | HR 94 | Ht 65.0 in | Wt 140.0 lb

## 2020-08-17 DIAGNOSIS — M25561 Pain in right knee: Secondary | ICD-10-CM

## 2020-08-17 DIAGNOSIS — S82141A Displaced bicondylar fracture of right tibia, initial encounter for closed fracture: Secondary | ICD-10-CM

## 2020-08-17 DIAGNOSIS — Z87891 Personal history of nicotine dependence: Secondary | ICD-10-CM | POA: Insufficient documentation

## 2020-08-17 DIAGNOSIS — W19XXXA Unspecified fall, initial encounter: Secondary | ICD-10-CM

## 2020-08-17 DIAGNOSIS — W0110XA Fall on same level from slipping, tripping and stumbling with subsequent striking against unspecified object, initial encounter: Secondary | ICD-10-CM | POA: Insufficient documentation

## 2020-08-17 DIAGNOSIS — M79674 Pain in right toe(s): Secondary | ICD-10-CM | POA: Insufficient documentation

## 2020-08-17 MED ORDER — OXYCODONE HCL 5 MG PO TABS: 5.0000 mg | ORAL_TABLET | ORAL | 0 refills | Status: DC | PRN

## 2020-08-17 MED ORDER — OXYCODONE HCL 5 MG PO TABS
5.0000 mg | ORAL_TABLET | Freq: Once | ORAL | Status: AC
  Administered 2020-08-17: 5 mg via ORAL
  Filled 2020-08-17: qty 1

## 2020-08-17 NOTE — Telephone Encounter (Signed)
Patient fall over her dog last night and went to ER and they want her to see Dr. Aline Brochure today but she needs urgent referral with her insurance plan([employer]). 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 Her number -[phone]

## 2020-08-17 NOTE — ED Notes (Signed)
Pt refused crutches because she had some.

## 2020-08-17 NOTE — ED Triage Notes (Signed)
Pt c/o right knee pain after tripping over dog last night.

## 2020-08-17 NOTE — Patient Instructions (Addendum)
Ice 4 x a day for 30 min   Weight bearing as tolerated   OOW 2 weeks   Take percocet and ibuprofen for pain   Use crutches   While we are working on your approval for MRI please go ahead and call to schedule your appointment with [facility] within at least one (1) week.   Central Scheduling [phone]   (I sent a message for them to call you but may be quicker for you to call them)

## 2020-08-17 NOTE — Telephone Encounter (Signed)
Referral placed for patient.  

## 2020-08-17 NOTE — Progress Notes (Signed)
New problem  60 year old female fell this morning tripping over the dog at home injuring right knee.  Status post right knee ACL reconstruction  Complains of severe pain.  Seen in the ER this morning with a negative x-ray  Patient has a large effusion over the right knee joint with severe pain loss of motion painful weightbearing even in the brace with crutches  Physical Exam Constitutional:      Appearance: Normal appearance.  Musculoskeletal:        General: Swelling, tenderness and signs of injury present. No deformity.     Right lower leg: No edema.     Left lower leg: No edema.     Comments: Right knee  Large joint effusion proximal tibial tenderness.  Could not assess ligamentous exam due to severe pain and swelling  Patient has 5 degree extension lag and 20 degrees flexion with pain  Neurovascular exam is intact  Skin:    General: Skin is warm and dry.     Findings: No bruising or erythema.  Neurological:     General: No focal deficit present.     Mental Status: She is alert and oriented to person, place, and time.     Gait: Gait abnormal.  Psychiatric:        Mood and Affect: Mood normal.        Thought Content: Thought content normal.     ER x-ray 4 views right knee no fracture or dislocation hardware from ACL reconstruction noted.  Question  Encounter Diagnoses  Name Primary?  . Acute pain of right knee   . Closed fracture of right tibial plateau, initial encounter Yes    Recommend MRI to evaluate for occult fracture and evaluate previous ACL graft  Weight-bear as tolerated with crutches and bracing Ice Bracing Continue ibuprofen and Percocet for pain Follow-up after MRI Out of work 2 weeks

## 2020-08-17 NOTE — Discharge Instructions (Addendum)
You were evaluated in the Emergency Department and after careful evaluation, we did not find any emergent condition requiring admission or further testing in the hospital.  Your exam/testing today was overall reassuring.  Recommend rest, ice, compression, elevation for the next 48 hours.  Recommend follow-up with your orthopedic specialist if not improved in 1 week.  Recommend Tylenol or Motrin for discomfort.  Please return to the Emergency Department if you experience any worsening of your condition.  Thank you for allowing [place] to be a part of your care.

## 2020-08-17 NOTE — ED Provider Notes (Signed)
[facility] Emergency Department Provider Note MRN:  [id number]  Arrival date & time: 08/17/20     Chief Complaint   Knee Pain   History of Present Illness   Natalie Hess is a 60 y.o. year-old female with no pertinent past medical history presenting to the ED with chief complaint of knee pain.  Patient was up at 2 in the morning getting a glass of water when she tripped over the dog, stubbed her right great toe and then fell forward onto the right knee.  Denies head trauma, no loss consciousness.  Denies any other pain other than the right knee and the right great toe.  Pain is worse in the knee, severe, constant, worse with any motion or palpation.  Review of Systems  A problem-focused ROS was performed. Positive for knee pain.  Patient denies head trauma.  Patient's Health History    Past Medical History:  Diagnosis Date  . Medical history non-contributory   . PONV (postoperative nausea and vomiting)    pt states the combination of zofran, decadron and scope patch worked well for her PONV history.    Past Surgical History:  Procedure Laterality Date  . ABDOMINAL HYSTERECTOMY    . ANTERIOR CRUCIATE LIGAMENT REPAIR Right 12/25/2017   Procedure: KNEE ARTHROSCOPY WITH ANTERIOR CRUCIATE LIGAMENT (ACL) REPAIR with Allograft Achilles,  medial and lateral meniscectomy;  Surgeon: Carole Civil, MD;  Location: [facility] ORS;  Service: Orthopedics;  Laterality: Right;  . CESAREAN SECTION     X 2  . COLONOSCOPY N/A 02/17/2015   Procedure: COLONOSCOPY;  Surgeon: Rogene Houston, MD;  Location: [facility] END[facility] SUITE;  Service: Endoscopy;  Laterality: N/A;  930  . KNEE ARTHROSCOPY WITH MEDIAL MENISECTOMY Right 04/24/2017   Procedure: KNEE ARTHROSCOPY WITH PARTIAL MEDIAL MENISECTOMY; ACL DEBRIEDMENT;  Surgeon: Carole Civil, MD;  Location: [facility] ORS;  Service: Orthopedics;  Laterality: Right;    Family History  Problem Relation Age of Onset  . Melanoma Mother        died  from metastatic disease    Social History   Socioeconomic History  . Marital status: Married    Spouse name: Not on file  . Number of children: Not on file  . Years of education: Not on file  . Highest education level: Not on file  Occupational History  . Not on file  Tobacco Use  . Smoking status: Former Smoker    Types: Cigarettes  . Smokeless tobacco: Never Used  Vaping Use  . Vaping Use: Never used  Substance and Sexual Activity  . Alcohol use: Yes    Alcohol/week: 0.0 standard drinks    Comment: socially  . Drug use: No  . Sexual activity: Yes    Birth control/protection: Surgical  Other Topics Concern  . Not on file  Social History Narrative  . Not on file   Social Determinants of Health   Financial Resource Strain: Not on file  Food Insecurity: Not on file  Transportation Needs: Not on file  Physical Activity: Not on file  Stress: Not on file  Social Connections: Not on file  Intimate Partner Violence: Not on file     Physical Exam   Vitals:   08/17/20 0603 08/17/20 0630  BP: (!) 163/96 (!) 154/86  Pulse: 79 76  Resp: 18 18  Temp: 98.1 F (36.7 C)   SpO2: 98% 99%    CONSTITUTIONAL: Well-appearing, NAD NEURO:  Alert and oriented x 3, no focal deficits EYES:  eyes equal and reactive ENT/NECK:  no LAD, no JVD CARDIO: Regular rate, well-perfused, normal S1 and S2 PULM:  CTAB no wheezing or rhonchi GI/GU:  normal bowel sounds, non-distended, non-tender MSK/SPINE:  No gross deformities, no edema; tenderness palpation to the right great toe at the base, tender to palpation to the right knee; neurovascularly intact extremity SKIN:  no rash, atraumatic PSYCH:  Appropriate speech and behavior  *Additional and/or pertinent findings included in MDM below  Diagnostic and Interventional Summary    EKG Interpretation  Date/Time:    Ventricular Rate:    PR Interval:    QRS Duration:   QT Interval:    QTC Calculation:   R Axis:     Text  Interpretation:        Labs Reviewed - No data to display  DG Foot Complete Right  Final Result    DG Knee Complete 4 Views Right  Final Result      Medications  oxyCODONE (Oxy IR/ROXICODONE) immediate release tablet 5 mg (5 mg Oral Given 08/17/20 0616)     Procedures  /  Critical Care Procedures  ED Course and Medical Decision Making  I have reviewed the triage vital signs, the nursing notes, and pertinent available records from the EMR.  Listed above are laboratory and imaging tests that I personally ordered, reviewed, and interpreted and then considered in my medical decision making (see below for details).  X-ray to evaluate for fracture.     X-rays negative for fracture.  Limb remains neurovascularly intact without any significant swelling or evidence of trauma.  Has a history of ACL repair in this knee, advise follow-up with her orthopedic specialist.  Barth Kirks. Sedonia Small, [place] [email or web address]  Final Clinical Impressions(s) / ED Diagnoses     ICD-10-CM   1. Fall, initial encounter  W19.XXXA   2. Acute pain of right knee  M25.561     ED Discharge Orders    None       Discharge Instructions Discussed with and Provided to Patient:     Discharge Instructions     You were evaluated in the Emergency Department and after careful evaluation, we did not find any emergent condition requiring admission or further testing in the hospital.  Your exam/testing today was overall reassuring.  Recommend rest, ice, compression, elevation for the next 48 hours.  Recommend follow-up with your orthopedic specialist if not improved in 1 week.  Recommend Tylenol or Motrin for discomfort.  Please return to the Emergency Department if you experience any worsening of your condition.  Thank you for allowing [place] to be a part of your care.        Maudie Flakes, MD 08/17/20 [phone]

## 2020-08-17 NOTE — Telephone Encounter (Signed)
Patient fell over her dog last night and went to ER and they want her to see Dr. Aline Brochure , orthopedics today but she needs urgent referral with her insurance [place]([employer]plan. 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

## 2020-08-18 ENCOUNTER — Other Ambulatory Visit: Payer: Self-pay | Admitting: Nurse Practitioner

## 2020-08-18 ENCOUNTER — Other Ambulatory Visit (HOSPITAL_COMMUNITY): Payer: Self-pay

## 2020-08-18 MED ORDER — ESTRADIOL 0.025 MG/24HR TD PTTW
MEDICATED_PATCH | TRANSDERMAL | 2 refills | Status: DC
  Filled 2020-08-18: qty 8, 28d supply, fill #0
  Filled 2020-10-03: qty 8, 28d supply, fill #1
  Filled 2020-12-11: qty 8, 28d supply, fill #2

## 2020-08-21 ENCOUNTER — Other Ambulatory Visit (HOSPITAL_COMMUNITY): Payer: Self-pay

## 2020-08-22 ENCOUNTER — Other Ambulatory Visit (HOSPITAL_COMMUNITY): Payer: Self-pay

## 2020-08-31 ENCOUNTER — Other Ambulatory Visit: Payer: Self-pay

## 2020-08-31 ENCOUNTER — Encounter: Payer: Self-pay | Admitting: Orthopedic Surgery

## 2020-08-31 ENCOUNTER — Telehealth: Payer: Self-pay | Admitting: Orthopedic Surgery

## 2020-08-31 ENCOUNTER — Ambulatory Visit (INDEPENDENT_AMBULATORY_CARE_PROVIDER_SITE_OTHER): Payer: No Typology Code available for payment source | Admitting: Orthopedic Surgery

## 2020-08-31 VITALS — BP 163/94 | HR 73 | Ht 65.0 in | Wt 140.0 lb

## 2020-08-31 DIAGNOSIS — T8489XD Other specified complication of internal orthopedic prosthetic devices, implants and grafts, subsequent encounter: Secondary | ICD-10-CM | POA: Diagnosis not present

## 2020-08-31 DIAGNOSIS — M25561 Pain in right knee: Secondary | ICD-10-CM | POA: Diagnosis not present

## 2020-08-31 NOTE — Progress Notes (Signed)
Follow-up  Encounter Diagnoses  Name Primary?   Acute pain of right knee Yes   Tear of anterior cruciate ligament graft, subsequent encounter     60 year old female status post ACL reconstruction fell injured her right knee MRI showed torn ACL with patellar contusion type fracture  Patient has been using ice elevation protected weightbearing bracing comes in for 2-week follow-up  I read her MRI the ACL graft is indeed torn is high-grade signal in the patella  There is also a contusion in the anterior tibial plateau with large joint effusion  Copy of the MRI report reads as follows IMPRESSION: 1. Prior ACL reconstruction with complete tear of the graft. 2. Acute nondisplaced fracture of the medial patella. 3. Contusion in the nonarticular anterior tibial plateau. 4. Large joint effusion.     Electronically Signed   By: Titus Dubin M.D.   On: 08/17/2020 12:44  As she is saying that the knee FEELS like it is unstable or not tracking properly  Effusion is down the range of motion has returned to normal the knee is very unstable in the anterior drawer and Lachman test joint effusion still present  Patella nontender anterior tibial plateau (contusion on MRI) tender  Recommend bracing and therapy, she is leaning towards ahead with knee replacement but I recommended that we try therapy first because many people can survive without an ACL    Assessment and plan   START THERAPY   TSCOPE BRACE  DO NOT WALK WITHOUT A BRACE  CAREFUL IN THE SHOWER  IBUPROFEN ICE  RECK 4 WEEKS  WORK:

## 2020-08-31 NOTE — Telephone Encounter (Signed)
Patient called to relay that Natalie Hess has no appointments, except for one date, in the next 2 weeks. States she is able to be seen Tuesday, 09/05/20, and is also on a wait list if they have cancellations. Patient is aware that her [employer] plan insurance is for [facility] providers only. States wanted to let Dr Aline Brochure know.

## 2020-08-31 NOTE — Patient Instructions (Signed)
START THERAPY  DO NOT WALK WITHOUT A BRACE  CAREFUL IN THE SHOWER  IBUPROFEN ICE  RECK 2 WEEKS

## 2020-09-05 ENCOUNTER — Other Ambulatory Visit: Payer: Self-pay

## 2020-09-05 ENCOUNTER — Encounter (HOSPITAL_COMMUNITY): Payer: Self-pay | Admitting: Physical Therapy

## 2020-09-05 ENCOUNTER — Ambulatory Visit (HOSPITAL_COMMUNITY): Payer: No Typology Code available for payment source | Attending: Orthopedic Surgery | Admitting: Physical Therapy

## 2020-09-05 DIAGNOSIS — M25561 Pain in right knee: Secondary | ICD-10-CM | POA: Diagnosis not present

## 2020-09-05 DIAGNOSIS — R2689 Other abnormalities of gait and mobility: Secondary | ICD-10-CM | POA: Diagnosis present

## 2020-09-05 NOTE — Patient Instructions (Signed)
Access Code: [email or web address] URL: [email or web address] Date: 09/05/2020 Prepared by: Josue Hector  Exercises Supine Quad Set - 2-3 x daily - 7 x weekly - 3 sets - 10 reps - 5 sec hold Clamshell - 2-3 x daily - 7 x weekly - 3 sets - 10 reps Prone Hip Extension - 2-3 x daily - 7 x weekly - 3 sets - 10 reps

## 2020-09-05 NOTE — Therapy (Signed)
[facility] [address] [place], [place], [address] Phone: [phone]   Fax:  [phone]  Physical Therapy Evaluation  Patient Details  Name: Natalie Hess MRN: [id number] Date of Birth: 12-Jun-1960 Referring Provider (PT): Arther Abbott MD   Encounter Date: 09/05/2020   PT End of Session - 09/05/20 1021     Visit Number 1    Number of Visits 12    Date for PT Re-Evaluation 10/17/20    Authorization Type [employer]    PT Start Time 0945    PT Stop Time 1025    PT Time Calculation (min) 40 min    Activity Tolerance Patient tolerated treatment well    Behavior During Therapy [facility] for tasks assessed/performed             Past Medical History:  Diagnosis Date   Medical history non-contributory    PONV (postoperative nausea and vomiting)    pt states the combination of zofran, decadron and scope patch worked well for her PONV history.    Past Surgical History:  Procedure Laterality Date   ABDOMINAL HYSTERECTOMY     ANTERIOR CRUCIATE LIGAMENT REPAIR Right 12/25/2017   Procedure: KNEE ARTHROSCOPY WITH ANTERIOR CRUCIATE LIGAMENT (ACL) REPAIR with Allograft Achilles,  medial and lateral meniscectomy;  Surgeon: Carole Civil, MD;  Location: [facility];  Service: Orthopedics;  Laterality: Right;   CESAREAN SECTION     X 2   COLONOSCOPY N/A 02/17/2015   Procedure: COLONOSCOPY;  Surgeon: Rogene Houston, MD;  Location: [facility];  Service: Endoscopy;  Laterality: N/A;  930   KNEE ARTHROSCOPY WITH MEDIAL MENISECTOMY Right 04/24/2017   Procedure: KNEE ARTHROSCOPY WITH PARTIAL MEDIAL MENISECTOMY; ACL DEBRIEDMENT;  Surgeon: Carole Civil, MD;  Location: [facility];  Service: Orthopedics;  Laterality: Right;    There were no vitals filed for this visit.    Subjective Assessment - 09/05/20 0955     Subjective Patient presents to therapy with complaint of RT knee pain after a fall. She tripped over her dog on 6/2 and tore  her ACL and fractured patella and tibial plateau. She had ACL repair previously about 2 years ago. Pain is currently well managed. She is currently ambulating with no AD, doing well with this. Managing symptoms with occasional NSAID.    Pertinent History RT ACL reconstruction 2019    Limitations Standing;Walking;House hold activities    Patient Stated Goals Improve stability    Currently in Pain? No/denies                [facility] PT Assessment - 09/05/20 0001       Assessment   Medical Diagnosis RT knee ACL tear    Referring Provider (PT) Arther Abbott MD    Prior Therapy Yes      Restrictions   Weight Bearing Restrictions Yes    RLE Weight Bearing Weight bearing as tolerated      Balance Screen   Has the patient fallen in the past 6 months Yes    How many times? 1    Has the patient had a decrease in activity level because of a fear of falling?  Yes    Is the patient reluctant to leave their home because of a fear of falling?  No      Prior Function   Level of Independence Independent      Cognition   Overall Cognitive Status Within Functional Limits for tasks assessed  Observation/Other Assessments   Focus on Therapeutic Outcomes (FOTO)  62% function      Observation/Other Assessments-Edema    Edema --   min edema noted about RT knee joint     ROM / Strength   AROM / PROM / Strength AROM;Strength      AROM   AROM Assessment Site Knee    Right/Left Knee Right;Left    Right Knee Extension 0    Right Knee Flexion 130    Left Knee Extension 0    Left Knee Flexion 135      Strength   Strength Assessment Site Hip;Knee    Right/Left Hip Right;Left    Right Hip Flexion 5/5    Right Hip Extension 4+/5    Right Hip ABduction 4+/5    Left Hip Flexion 5/5    Left Hip Extension 4+/5    Left Hip ABduction 4+/5    Right/Left Knee Right;Left    Right Knee Extension --   NT per patella fracture   Left Knee Extension 5/5      Flexibility   Soft Tissue  Assessment /Muscle Length --   Mod restriction in RT quad flexibility     Palpation   Palpation comment No notable TTP      Balance   Balance Assessed Yes      Static Standing Balance   Static Standing Balance -  Activities  Single Leg Stance - Right Leg;Single Leg Stance - Left Leg    Static Standing - Comment/# of Minutes 15 sec mod sway, 20 sec                        Objective measurements completed on examination: See above findings.       [facility] Adult PT Treatment/Exercise - 09/05/20 0001       Ambulation/Gait   Ambulation/Gait Yes    Ambulation/Gait Assistance 7: Independent    Ambulation Distance (Feet) 460 Feet    Assistive device None    Gait Pattern Decreased stance time - right    Ambulation Surface Level;Indoor    Gait Comments 2MWT      Exercises   Exercises Knee/Hip      Knee/Hip Exercises: Supine   Quad Sets Right;1 set;10 reps      Knee/Hip Exercises: Sidelying   Clams RT x10                    PT Education - 09/05/20 0956     Education Details on evaluation findings, POC and HEP    Person(s) Educated Patient    Methods Explanation;Handout    Comprehension Verbalized understanding              PT Short Term Goals - 09/05/20 1051       PT SHORT TERM GOAL #1   Title Patient will be independent with initial HEP and self-management strategies to improve functional outcomes    Time 3    Period Weeks    Status New    Target Date 09/26/20               PT Long Term Goals - 09/05/20 1051       PT LONG TERM GOAL #1   Title Patient will improve FOTO score to predicted value to indicate improvement in functional outcomes    Time 6    Period Weeks    Status New    Target Date 10/17/20  PT LONG TERM GOAL #2   Title Patient will be able to walk >1 mile with good mechanics and no increased pain for return to exercise and help with functional work related tasks.    Time 6    Period Weeks    Status New     Target Date 10/17/20      PT LONG TERM GOAL #3   Title Patient will be able to ascend/ descend flight of stairs with reciprocal pattern and no increased knee pain for return to PLOF with ADLs and work tasks    Time 6    Period Weeks    Status New    Target Date 10/17/20      PT LONG TERM GOAL #4   Title Patient will be able to maintain single limb stance on RLE for > 30 seconds on compliant surface to demonstrate improved knee stabilization for ambulating on uneven surfaces.    Time 6    Period Weeks    Status New    Target Date 10/17/20                    Plan - 09/05/20 1047     Clinical Impression Statement Patient is a 60 y.o. female who presents to physical therapy with complaint of RT knee pain. Patient demonstrates decreased strength, balance deficits and gait abnormalities which are likely contributing to symptoms of pain and are negatively impacting patient ability to perform ADLs and functional mobility tasks. Patient will benefit from skilled physical therapy services to address these deficits to reduce pain, improve level of function with ADLs, functional mobility tasks, and reduce risk for falls.    Personal Factors and Comorbidities Past/Current Experience    Examination-Activity Limitations Squat;Stairs;Locomotion Level    Examination-Participation Restrictions Occupation;Yard Work;Community Activity    Stability/Clinical Decision Making Stable/Uncomplicated    Clinical Decision Making Low    Rehab Potential Good    PT Frequency 2x / week    PT Duration 6 weeks    PT Treatment/Interventions ADLs/Self Care Home Management;Aquatic Therapy;Biofeedback;Cryotherapy;Electrical Stimulation;Functional mobility training;Stair training;Therapeutic activities;Iontophoresis 4mg /ml Dexamethasone;Therapeutic exercise;Moist Heat;Traction;Balance training;Manual lymph drainage;Manual techniques;Vasopneumatic Device;Splinting;Taping;Orthotic Fit/Training;Dry needling;Energy  conservation;Joint Manipulations;Spinal Manipulations;Passive range of motion;Scar mobilization;Compression bandaging;Visual/perceptual remediation/compensation;Ultrasound;Parrafin;Fluidtherapy;Contrast Bath;DME Instruction;Gait training;Patient/family education;Neuromuscular re-education    PT Next Visit Plan Review HEP. Progress hip and knee strength as tolerated. Gentle with quad strength per patellar fracture, proceed as tolerated, favor closed chain. Work on pain free quad flixibility. Focus ultimately on stabilization.    PT Home Exercise Plan Eval: quad set, sidelying clam, prone hip extension    Consulted and Agree with Plan of Care Patient             Patient will benefit from skilled therapeutic intervention in order to improve the following deficits and impairments:  Abnormal gait, Decreased activity tolerance, Decreased balance, Difficulty walking, Impaired flexibility, Improper body mechanics, Pain, Decreased strength, Increased edema  Visit Diagnosis: Right knee pain, unspecified chronicity  Other abnormalities of gait and mobility     Problem List Patient Active Problem List   Diagnosis Date Noted   Dysosmia 08/16/2020   COVID-19 virus infection 04/06/2020   Acute bacterial rhinosinusitis 04/06/2020   Wellness examination 11/15/2019   Rupture of anterior cruciate ligament of right knee    S/P ACL repair right 12/25/17 05/01/2017   Vasomotor flushing 08/30/2014   Pain in joint, shoulder region 12/14/2013   Muscle weakness (generalized) 12/14/2013   Decreased range of motion of right shoulder 12/14/2013   10:59  AM, 09/05/20 Josue Hector PT DPT  Physical Therapist with [facility]  [phone] [facility] [address] [place], [place], [address] Phone: [phone]   Fax:  [phone]  Name: AMESHIA PEWITT MRN: [id number] Date of Birth: [birth year]

## 2020-09-07 ENCOUNTER — Encounter (HOSPITAL_COMMUNITY): Payer: Self-pay | Admitting: Physical Therapy

## 2020-09-07 ENCOUNTER — Ambulatory Visit (HOSPITAL_COMMUNITY): Payer: No Typology Code available for payment source | Admitting: Physical Therapy

## 2020-09-07 ENCOUNTER — Other Ambulatory Visit: Payer: Self-pay

## 2020-09-07 DIAGNOSIS — M25561 Pain in right knee: Secondary | ICD-10-CM

## 2020-09-07 DIAGNOSIS — R2689 Other abnormalities of gait and mobility: Secondary | ICD-10-CM

## 2020-09-07 NOTE — Patient Instructions (Signed)
Access Code: [email or web address] URL: [email or web address] Date: 09/07/2020 Prepared by: Josue Hector  Exercises Supine Bridge - 2-3 x daily - 7 x weekly - 2 sets - 10 reps Heel rises with counter support - 2-3 x daily - 7 x weekly - 2 sets - 10 reps Prone Hamstring Curl with Ankle Weight - 2-3 x daily - 7 x weekly - 2 sets - 10 reps

## 2020-09-07 NOTE — Therapy (Signed)
[facility] [address] [place], [place], [address] Phone: [phone]   Fax:  [phone]  Physical Therapy Treatment  Patient Details  Name: Natalie Hess MRN: [id number] Date of Birth: Nov 10, 1960 Referring Provider (PT): Arther Abbott MD   Encounter Date: 09/07/2020   PT End of Session - 09/07/20 0908     Visit Number 2    Number of Visits 12    Date for PT Re-Evaluation 10/17/20    Authorization Type [employer]    PT Start Time 0901    PT Stop Time [phone]    PT Time Calculation (min) 42 min    Activity Tolerance Patient tolerated treatment well    Behavior During Therapy [facility] for tasks assessed/performed             Past Medical History:  Diagnosis Date   Medical history non-contributory    PONV (postoperative nausea and vomiting)    pt states the combination of zofran, decadron and scope patch worked well for her PONV history.    Past Surgical History:  Procedure Laterality Date   ABDOMINAL HYSTERECTOMY     ANTERIOR CRUCIATE LIGAMENT REPAIR Right 12/25/2017   Procedure: KNEE ARTHROSCOPY WITH ANTERIOR CRUCIATE LIGAMENT (ACL) REPAIR with Allograft Achilles,  medial and lateral meniscectomy;  Surgeon: Carole Civil, MD;  Location: [facility];  Service: Orthopedics;  Laterality: Right;   CESAREAN SECTION     X 2   COLONOSCOPY N/A 02/17/2015   Procedure: COLONOSCOPY;  Surgeon: Rogene Houston, MD;  [facility];  Service: Endoscopy;  Laterality: N/A;  930   KNEE ARTHROSCOPY WITH MEDIAL MENISECTOMY Right 04/24/2017   Procedure: KNEE ARTHROSCOPY WITH PARTIAL MEDIAL MENISECTOMY; ACL DEBRIEDMENT;  Surgeon: Carole Civil, MD;  Location: [facility];  Service: Orthopedics;  Laterality: Right;    There were no vitals filed for this visit.   Subjective Assessment - 09/07/20 0908     Subjective Reports without complaint. HEP going well, no issues.    Pertinent History RT ACL reconstruction 2019    Limitations  Standing;Walking;House hold activities    Patient Stated Goals Improve stability    Currently in Pain? No/denies                               [facility] Adult PT Treatment/Exercise - 09/07/20 0001       Knee/Hip Exercises: Standing   Heel Raises Both;2 sets;10 reps    Hip Abduction Both;2 sets;10 reps;Knee straight      Knee/Hip Exercises: Supine   Quad Sets Right;1 set;15 reps    Heel Slides Right;1 set;15 reps    Bridges Both;2 sets;10 reps      Knee/Hip Exercises: Sidelying   Clams Both 2 x 10      Knee/Hip Exercises: Prone   Hamstring Curl 2 sets;10 reps    Hamstring Curl Limitations 2#    Hip Extension Both;2 sets;10 reps                      PT Short Term Goals - 09/05/20 1051       PT SHORT TERM GOAL #1   Title Patient will be independent with initial HEP and self-management strategies to improve functional outcomes    Time 3    Period Weeks    Status New    Target Date 09/26/20  PT Long Term Goals - 09/05/20 1051       PT LONG TERM GOAL #1   Title Patient will improve FOTO score to predicted value to indicate improvement in functional outcomes    Time 6    Period Weeks    Status New    Target Date 10/17/20      PT LONG TERM GOAL #2   Title Patient will be able to walk >1 mile with good mechanics and no increased pain for return to exercise and help with functional work related tasks.    Time 6    Period Weeks    Status New    Target Date 10/17/20      PT LONG TERM GOAL #3   Title Patient will be able to ascend/ descend flight of stairs with reciprocal pattern and no increased knee pain for return to PLOF with ADLs and work tasks    Time 6    Period Weeks    Status New    Target Date 10/17/20      PT LONG TERM GOAL #4   Title Patient will be able to maintain single limb stance on RLE for > 30 seconds on compliant surface to demonstrate improved knee stabilization for ambulating on uneven surfaces.     Time 6    Period Weeks    Status New    Target Date 10/17/20                   Plan - 09/07/20 0941     Clinical Impression Statement Patient tolerated session well. Reviewed goals and HEP. Progressed LE strengthening with focus on glutes and quads. Patient cued on proper form and function of all added exercises. Required verbal cues to avoid knee valgus with hip bridge. Issued updated HEP handout. Will continue to progress as tolerated.    Personal Factors and Comorbidities Past/Current Experience    Examination-Activity Limitations Squat;Stairs;Locomotion Level    Examination-Participation Restrictions Occupation;Yard Work;Community Activity    Stability/Clinical Decision Making Stable/Uncomplicated    Rehab Potential Good    PT Frequency 2x / week    PT Duration 6 weeks    PT Treatment/Interventions ADLs/Self Care Home Management;Aquatic Therapy;Biofeedback;Cryotherapy;Electrical Stimulation;Functional mobility training;Stair training;Therapeutic activities;Iontophoresis 4mg /ml Dexamethasone;Therapeutic exercise;Moist Heat;Traction;Balance training;Manual lymph drainage;Manual techniques;Vasopneumatic Device;Splinting;Taping;Orthotic Fit/Training;Dry needling;Energy conservation;Joint Manipulations;Spinal Manipulations;Passive range of motion;Scar mobilization;Compression bandaging;Visual/perceptual remediation/compensation;Ultrasound;Parrafin;Fluidtherapy;Contrast Bath;DME Instruction;Gait training;Patient/family education;Neuromuscular re-education    PT Next Visit Plan Progress hip and knee strength as tolerated. Gentle with quad strength per patellar fracture, proceed as tolerated, favor closed chain. Work on pain free quad flixibility. Focus ultimately on stabilization.    PT Home Exercise Plan Eval: quad set, sidelying clam, prone hip extension 6/23 bridge, hamstring curl, heel raise    Consulted and Agree with Plan of Care Patient             Patient will benefit  from skilled therapeutic intervention in order to improve the following deficits and impairments:  Abnormal gait, Decreased activity tolerance, Decreased balance, Difficulty walking, Impaired flexibility, Improper body mechanics, Pain, Decreased strength, Increased edema  Visit Diagnosis: Right knee pain, unspecified chronicity  Other abnormalities of gait and mobility     Problem List Patient Active Problem List   Diagnosis Date Noted   Dysosmia 08/16/2020   COVID-19 virus infection 04/06/2020   Acute bacterial rhinosinusitis 04/06/2020   Wellness examination 11/15/2019   Rupture of anterior cruciate ligament of right knee    S/P ACL repair right 12/25/17 05/01/2017   Vasomotor  flushing 08/30/2014   Pain in joint, shoulder region 12/14/2013   Muscle weakness (generalized) 12/14/2013   Decreased range of motion of right shoulder 12/14/2013   9:45 AM, 09/07/20 Josue Hector PT DPT  Physical Therapist with [facility]  [phone] [place] [address] [place], [place], [address] Phone: [phone]   Fax:  [phone]  Name: TENNIE GRUSSING MRN: [id number] Date of Birth: 01-Nov-1960

## 2020-09-11 ENCOUNTER — Ambulatory Visit (HOSPITAL_COMMUNITY): Payer: No Typology Code available for payment source | Admitting: Physical Therapy

## 2020-09-11 ENCOUNTER — Other Ambulatory Visit: Payer: Self-pay

## 2020-09-11 DIAGNOSIS — M25561 Pain in right knee: Secondary | ICD-10-CM | POA: Diagnosis not present

## 2020-09-11 DIAGNOSIS — R2689 Other abnormalities of gait and mobility: Secondary | ICD-10-CM

## 2020-09-11 NOTE — Therapy (Signed)
[facility] [address] [place], [place], [address] Phone: [phone]   Fax:  [phone]  Physical Therapy Treatment  Patient Details  Name: Natalie Hess MRN: [id number] Date of Birth: 27-Feb-1961 Referring Provider (PT): Arther Abbott MD   Encounter Date: 09/11/2020   PT End of Session - 09/11/20 1718     Visit Number 3    Number of Visits 12    Date for PT Re-Evaluation 10/17/20    Authorization Type [employer]    PT Start Time 1619    PT Stop Time 1700    PT Time Calculation (min) 41 min    Activity Tolerance Patient tolerated treatment well    Behavior During Therapy [facility] for tasks assessed/performed             Past Medical History:  Diagnosis Date   Medical history non-contributory    PONV (postoperative nausea and vomiting)    pt states the combination of zofran, decadron and scope patch worked well for her PONV history.    Past Surgical History:  Procedure Laterality Date   ABDOMINAL HYSTERECTOMY     ANTERIOR CRUCIATE LIGAMENT REPAIR Right 12/25/2017   Procedure: KNEE ARTHROSCOPY WITH ANTERIOR CRUCIATE LIGAMENT (ACL) REPAIR with Allograft Achilles,  medial and lateral meniscectomy;  Surgeon: Carole Civil, MD;  Location: [facility];  Service: Orthopedics;  Laterality: Right;   CESAREAN SECTION     X 2   COLONOSCOPY N/A 02/17/2015   Procedure: COLONOSCOPY;  Surgeon: Rogene Houston, MD;  [facility];  Service: Endoscopy;  Laterality: N/A;  930   KNEE ARTHROSCOPY WITH MEDIAL MENISECTOMY Right 04/24/2017   Procedure: KNEE ARTHROSCOPY WITH PARTIAL MEDIAL MENISECTOMY; ACL DEBRIEDMENT;  Surgeon: Carole Civil, MD;  Location: [facility];  Service: Orthopedics;  Laterality: Right;    There were no vitals filed for this visit.   Subjective Assessment - 09/11/20 1629     Subjective pt states her knee is not hurting today.  continiues to wear her hinge brace.  States her sister passed on 2022/06/27 and  had to r/s her appt to today.    Currently in Pain? No/denies                               [facility] Adult PT Treatment/Exercise - 09/11/20 0001       Knee/Hip Exercises: Machines for Strengthening   Total Gym Leg Press 26 degrees single leg squat 10X2      Knee/Hip Exercises: Standing   Heel Raises Both;2 sets;10 reps    Forward Lunges Right;Left;2 sets;10 reps    Forward Lunges Limitations onto 4" step    Hip Abduction Both;2 sets;10 reps;Knee straight    Lateral Step Up Right;10 reps;2 sets;Hand Hold: 1;Step Height: 4"    Forward Step Up Right;10 reps;2 sets;Hand Hold: 1;Step Height: 4"    Wall Squat 10 reps;10 seconds    Wall Squat Limitations wall slides    SLS with Vectors 15x5" holds with 1 HHA                      PT Short Term Goals - 09/05/20 1051       PT SHORT TERM GOAL #1   Title Patient will be independent with initial HEP and self-management strategies to improve functional outcomes    Time 3    Period Weeks    Status New  Target Date 09/26/20               PT Long Term Goals - 09/05/20 1051       PT LONG TERM GOAL #1   Title Patient will improve FOTO score to predicted value to indicate improvement in functional outcomes    Time 6    Period Weeks    Status New    Target Date 10/17/20      PT LONG TERM GOAL #2   Title Patient will be able to walk >1 mile with good mechanics and no increased pain for return to exercise and help with functional work related tasks.    Time 6    Period Weeks    Status New    Target Date 10/17/20      PT LONG TERM GOAL #3   Title Patient will be able to ascend/ descend flight of stairs with reciprocal pattern and no increased knee pain for return to PLOF with ADLs and work tasks    Time 6    Period Weeks    Status New    Target Date 10/17/20      PT LONG TERM GOAL #4   Title Patient will be able to maintain single limb stance on RLE for > 30 seconds on compliant surface to  demonstrate improved knee stabilization for ambulating on uneven surfaces.    Time 6    Period Weeks    Status New    Target Date 10/17/20                   Plan - 09/11/20 1723     Clinical Impression Statement continued with focus on Rt LE strengthening.  Completed all exercises with brace donned.  Pt required cues for holds and slow controlled movements.  Pt completed 2 sets of most therex and had no complaints of pain or instabilities.    Personal Factors and Comorbidities Past/Current Experience    Examination-Activity Limitations Squat;Stairs;Locomotion Level    Examination-Participation Restrictions Occupation;Yard Work;Community Activity    Stability/Clinical Decision Making Stable/Uncomplicated    Rehab Potential Good    PT Frequency 2x / week    PT Duration 6 weeks    PT Treatment/Interventions ADLs/Self Care Home Management;Aquatic Therapy;Biofeedback;Cryotherapy;Electrical Stimulation;Functional mobility training;Stair training;Therapeutic activities;Iontophoresis 4mg /ml Dexamethasone;Therapeutic exercise;Moist Heat;Traction;Balance training;Manual lymph drainage;Manual techniques;Vasopneumatic Device;Splinting;Taping;Orthotic Fit/Training;Dry needling;Energy conservation;Joint Manipulations;Spinal Manipulations;Passive range of motion;Scar mobilization;Compression bandaging;Visual/perceptual remediation/compensation;Ultrasound;Parrafin;Fluidtherapy;Contrast Bath;DME Instruction;Gait training;Patient/family education;Neuromuscular re-education    PT Next Visit Plan Progress hip and knee strength as tolerated. Gentle with quad strength per patellar fracture, proceed as tolerated, favor closed chain. Work on pain free quad flixibility. Focus ultimately on stabilization.    PT Home Exercise Plan Eval: quad set, sidelying clam, prone hip extension 6/23 bridge, hamstring curl, heel raise    Consulted and Agree with Plan of Care Patient             Patient will benefit  from skilled therapeutic intervention in order to improve the following deficits and impairments:  Abnormal gait, Decreased activity tolerance, Decreased balance, Difficulty walking, Impaired flexibility, Improper body mechanics, Pain, Decreased strength, Increased edema  Visit Diagnosis: Right knee pain, unspecified chronicity  Other abnormalities of gait and mobility     Problem List Patient Active Problem List   Diagnosis Date Noted   Dysosmia 08/16/2020   COVID-19 virus infection 04/06/2020   Acute bacterial rhinosinusitis 04/06/2020   Wellness examination 11/15/2019   Rupture of anterior cruciate ligament of right knee  S/P ACL repair right 12/25/17 05/01/2017   Vasomotor flushing 08/30/2014   Pain in joint, shoulder region 12/14/2013   Muscle weakness (generalized) 12/14/2013   Decreased range of motion of right shoulder 12/14/2013   Teena Irani, PTA/CLT [phone]  Teena Irani 09/11/2020, 5:41 PM  [facility] [address] [place], [place], [address] Phone: [phone]   Fax:  [phone]  Name: Natalie Hess MRN: [id number] Date of Birth: 02-06-1961

## 2020-09-12 ENCOUNTER — Ambulatory Visit (HOSPITAL_COMMUNITY): Payer: No Typology Code available for payment source | Admitting: Physical Therapy

## 2020-09-14 ENCOUNTER — Ambulatory Visit (HOSPITAL_COMMUNITY): Payer: No Typology Code available for payment source | Admitting: Physical Therapy

## 2020-09-14 ENCOUNTER — Encounter: Payer: Self-pay | Admitting: Orthopedic Surgery

## 2020-09-14 ENCOUNTER — Ambulatory Visit (INDEPENDENT_AMBULATORY_CARE_PROVIDER_SITE_OTHER): Payer: No Typology Code available for payment source | Admitting: Orthopedic Surgery

## 2020-09-14 ENCOUNTER — Encounter (HOSPITAL_COMMUNITY): Payer: Self-pay | Admitting: Physical Therapy

## 2020-09-14 ENCOUNTER — Other Ambulatory Visit: Payer: Self-pay

## 2020-09-14 VITALS — BP 151/91 | HR 79 | Ht 65.0 in | Wt 147.2 lb

## 2020-09-14 DIAGNOSIS — T8489XD Other specified complication of internal orthopedic prosthetic devices, implants and grafts, subsequent encounter: Secondary | ICD-10-CM | POA: Diagnosis not present

## 2020-09-14 DIAGNOSIS — M25561 Pain in right knee: Secondary | ICD-10-CM | POA: Diagnosis not present

## 2020-09-14 DIAGNOSIS — R2689 Other abnormalities of gait and mobility: Secondary | ICD-10-CM

## 2020-09-14 NOTE — Therapy (Signed)
[facility] [address] [place], [place], [address] Phone: [phone]   Fax:  [phone]  Physical Therapy Treatment  Patient Details  Name: SHADEN HIGLEY MRN: [id number] Date of Birth: [birth year] Referring Provider (PT): Arther Abbott MD   Encounter Date: 09/14/2020   PT End of Session - 09/14/20 1310     Visit Number 4    Number of Visits 12    Date for PT Re-Evaluation 10/17/20    Authorization Type [employer]    PT Start Time 1305    PT Stop Time 1345    PT Time Calculation (min) 40 min    Activity Tolerance Patient tolerated treatment well    Behavior During Therapy [facility] for tasks assessed/performed             Past Medical History:  Diagnosis Date   Medical history non-contributory    PONV (postoperative nausea and vomiting)    pt states the combination of zofran, decadron and scope patch worked well for her PONV history.    Past Surgical History:  Procedure Laterality Date   ABDOMINAL HYSTERECTOMY     ANTERIOR CRUCIATE LIGAMENT REPAIR Right 12/25/2017   Procedure: KNEE ARTHROSCOPY WITH ANTERIOR CRUCIATE LIGAMENT (ACL) REPAIR with Allograft Achilles,  medial and lateral meniscectomy;  Surgeon: Carole Civil, MD;  Location: [facility];  Service: Orthopedics;  Laterality: Right;   CESAREAN SECTION     X 2   COLONOSCOPY N/A 02/17/2015   Procedure: COLONOSCOPY;  Surgeon: Rogene Houston, MD;  Location: [facility];  Service: Endoscopy;  Laterality: N/A;  930   KNEE ARTHROSCOPY WITH MEDIAL MENISECTOMY Right 04/24/2017   Procedure: KNEE ARTHROSCOPY WITH PARTIAL MEDIAL MENISECTOMY; ACL DEBRIEDMENT;  Surgeon: Carole Civil, MD;  Location: [facility];  Service: Orthopedics;  Laterality: Right;    There were no vitals filed for this visit.   Subjective Assessment - 09/14/20 1309     Subjective Doing well. Had follow up with MD, things going well. Stay in brace 4 more weeks. Can go back to work 7/11. No pain.     Pertinent History RT ACL reconstruction 2019    Currently in Pain? No/denies                               [facility] Adult PT Treatment/Exercise - 09/14/20 0001       Knee/Hip Exercises: Aerobic   Recumbent Bike 5 min dynamaic warmup Lv 2 seat 8      Knee/Hip Exercises: Standing   Heel Raises 3 sets;10 reps    Forward Lunges Both;3 sets;10 reps    Hip Abduction 3 sets;10 reps    Abduction Limitations RTB    Hip Extension 3 sets;10 reps    Extension Limitations RTB    Step Down 3 sets;10 reps;Step Height: 2"    Step Down Limitations eccentric heel tap    Functional Squat 3 sets;10 reps    SLS with Vectors 3 x 5" hold 3 way each leg    Gait Training 2RT in clinic    Other Standing Knee Exercises band sidestepping RTB 4RT                      PT Short Term Goals - 09/05/20 1051       PT SHORT TERM GOAL #1   Title Patient will be independent with initial HEP and self-management strategies to  improve functional outcomes    Time 3    Period Weeks    Status New    Target Date 09/26/20               PT Long Term Goals - 09/05/20 1051       PT LONG TERM GOAL #1   Title Patient will improve FOTO score to predicted value to indicate improvement in functional outcomes    Time 6    Period Weeks    Status New    Target Date 10/17/20      PT LONG TERM GOAL #2   Title Patient will be able to walk >1 mile with good mechanics and no increased pain for return to exercise and help with functional work related tasks.    Time 6    Period Weeks    Status New    Target Date 10/17/20      PT LONG TERM GOAL #3   Title Patient will be able to ascend/ descend flight of stairs with reciprocal pattern and no increased knee pain for return to PLOF with ADLs and work tasks    Time 6    Period Weeks    Status New    Target Date 10/17/20      PT LONG TERM GOAL #4   Title Patient will be able to maintain single limb stance on RLE for > 30 seconds on  compliant surface to demonstrate improved knee stabilization for ambulating on uneven surfaces.    Time 6    Period Weeks    Status New    Target Date 10/17/20                   Plan - 09/14/20 1344     Clinical Impression Statement Patient tolerated session well today. Was well challenged with ther ex progressions. Added band to hip abduction and extension. Patient noting increased muscle fatigue and challenged with stability during eccentric heel tap down on 2 inch step. Patient will continue to benefit from ther ex progressions for reduced pain and improved functional mobility.    Personal Factors and Comorbidities Past/Current Experience    Examination-Activity Limitations Squat;Stairs;Locomotion Level    Examination-Participation Restrictions Occupation;Yard Work;Community Activity    Stability/Clinical Decision Making Stable/Uncomplicated    Rehab Potential Good    PT Frequency 2x / week    PT Duration 6 weeks    PT Treatment/Interventions ADLs/Self Care Home Management;Aquatic Therapy;Biofeedback;Cryotherapy;Electrical Stimulation;Functional mobility training;Stair training;Therapeutic activities;Iontophoresis 4mg /ml Dexamethasone;Therapeutic exercise;Moist Heat;Traction;Balance training;Manual lymph drainage;Manual techniques;Vasopneumatic Device;Splinting;Taping;Orthotic Fit/Training;Dry needling;Energy conservation;Joint Manipulations;Spinal Manipulations;Passive range of motion;Scar mobilization;Compression bandaging;Visual/perceptual remediation/compensation;Ultrasound;Parrafin;Fluidtherapy;Contrast Bath;DME Instruction;Gait training;Patient/family education;Neuromuscular re-education    PT Next Visit Plan Progress hip and knee strength as tolerated. Gentle with quad strength per patellar fracture, proceed as tolerated, favor closed chain. Work on pain free quad flixibility. Focus ultimately on stabilization.    PT Home Exercise Plan Eval: quad set, sidelying clam, prone hip  extension 6/23 bridge, hamstring curl, heel raise 6/30 band hip abduction, extension, single leg balance    Consulted and Agree with Plan of Care Patient             Patient will benefit from skilled therapeutic intervention in order to improve the following deficits and impairments:  Abnormal gait, Decreased activity tolerance, Decreased balance, Difficulty walking, Impaired flexibility, Improper body mechanics, Pain, Decreased strength, Increased edema  Visit Diagnosis: Right knee pain, unspecified chronicity  Other abnormalities of gait and mobility     Problem List Patient  Active Problem List   Diagnosis Date Noted   Dysosmia 08/16/2020   COVID-19 virus infection 04/06/2020   Acute bacterial rhinosinusitis 04/06/2020   Wellness examination 11/15/2019   Rupture of anterior cruciate ligament of right knee    S/P ACL repair right 12/25/17 05/01/2017   Vasomotor flushing 08/30/2014   Pain in joint, shoulder region 12/14/2013   Muscle weakness (generalized) 12/14/2013   Decreased range of motion of right shoulder 12/14/2013   1:50 PM, 09/14/20 Josue Hector PT DPT  Physical Therapist with [facility]  [phone] [place] [address] [place], [place], [address] Phone: [phone]   Fax:  [phone]  Name: KEMISHA BONNETTE MRN: [id number] Date of Birth: 1960-10-21

## 2020-09-14 NOTE — Patient Instructions (Signed)
RTW July 11TH IN [place]

## 2020-09-14 NOTE — Progress Notes (Signed)
FOLLOW UP   Encounter Diagnosis  Name Primary?   Tear of anterior cruciate ligament graft, subsequent encounter Yes     Chief Complaint  Patient presents with   Knee Pain    Right knee/follow up/pt states knee is feeling a little better   JUNE 2ND LAST VISIT   60 year old female  tripping over the dog at home injuring right knee.  Status post right knee ACL reconstruction   The patient is 4 weeks post injury after starting physical therapy and wearing the knee brace   Natalie Hess is improving she is regained full range of motion no swelling she has some mild pain in the proximal tibia she has had no giving way episodes  She is still wearing the brace and going to physical therapy  She should be able to return to work July 11 she will continue brace wear physical therapy in 4-week follow-up

## 2020-09-21 ENCOUNTER — Other Ambulatory Visit: Payer: Self-pay

## 2020-09-21 ENCOUNTER — Ambulatory Visit (HOSPITAL_COMMUNITY): Payer: No Typology Code available for payment source | Attending: Orthopedic Surgery | Admitting: Physical Therapy

## 2020-09-21 ENCOUNTER — Encounter (HOSPITAL_COMMUNITY): Payer: Self-pay | Admitting: Physical Therapy

## 2020-09-21 DIAGNOSIS — R2689 Other abnormalities of gait and mobility: Secondary | ICD-10-CM | POA: Insufficient documentation

## 2020-09-21 DIAGNOSIS — M25561 Pain in right knee: Secondary | ICD-10-CM | POA: Insufficient documentation

## 2020-09-21 NOTE — Therapy (Signed)
[facility] [address] [place], [place], [address] Phone: [phone]   Fax:  [phone]  Physical Therapy Treatment  Patient Details  Name: Natalie Hess MRN: [id number] Date of Birth: 1960/09/28 Referring Provider (PT): Arther Abbott MD   Encounter Date: 09/21/2020   PT End of Session - 09/21/20 0831     Visit Number 5    Number of Visits 12    Date for PT Re-Evaluation 10/17/20    Authorization Type [employer]    PT Start Time 0831    PT Stop Time 0909    PT Time Calculation (min) 38 min    Activity Tolerance Patient tolerated treatment well    Behavior During Therapy [facility] for tasks assessed/performed             Past Medical History:  Diagnosis Date   Medical history non-contributory    PONV (postoperative nausea and vomiting)    pt states the combination of zofran, decadron and scope patch worked well for her PONV history.    Past Surgical History:  Procedure Laterality Date   ABDOMINAL HYSTERECTOMY     ANTERIOR CRUCIATE LIGAMENT REPAIR Right 12/25/2017   Procedure: KNEE ARTHROSCOPY WITH ANTERIOR CRUCIATE LIGAMENT (ACL) REPAIR with Allograft Achilles,  medial and lateral meniscectomy;  Surgeon: Carole Civil, MD;  Location: [facility];  Service: Orthopedics;  Laterality: Right;   CESAREAN SECTION     X 2   COLONOSCOPY N/A 02/17/2015   Procedure: COLONOSCOPY;  Surgeon: Rogene Houston, MD;  [facility];  Service: Endoscopy;  Laterality: N/A;  930   KNEE ARTHROSCOPY WITH MEDIAL MENISECTOMY Right 04/24/2017   Procedure: KNEE ARTHROSCOPY WITH PARTIAL MEDIAL MENISECTOMY; ACL DEBRIEDMENT;  Surgeon: Carole Civil, MD;  Location: [facility];  Service: Orthopedics;  Laterality: Right;    There were no vitals filed for this visit.   Subjective Assessment - 09/21/20 0832     Subjective Patient states knee is feeling good. She has been walking more. She is going back to work next week. feels limited with  strength,    Pertinent History RT ACL reconstruction 2019    Currently in Pain? No/denies                               [facility] Adult PT Treatment/Exercise - 09/21/20 0001       Knee/Hip Exercises: Aerobic   Recumbent Bike 5 min dynamaic warmup Lv 3 seat 8      Knee/Hip Exercises: Standing   Heel Raises Both;2 sets;15 reps    Heel Raises Limitations incline    Forward Lunges Both;3 sets;10 reps    Forward Lunges Limitations with unilateral UE support    Lateral Step Up Right;2 sets;Hand Hold: 1;Step Height: 4"    Lateral Step Up Limitations eccentric control    Forward Step Up Right;2 sets;10 reps;Hand Hold: 0;Step Height: 6";15 reps    Step Down Right;3 sets;10 reps;Hand Hold: 2;Step Height: 4"    Step Down Limitations eccentric heel tap    Functional Squat 2 sets;10 reps    Functional Squat Limitations with chair for cueing                    PT Education - 09/21/20 0832     Education Details HEP    Person(s) Educated Patient    Methods Explanation;Demonstration    Comprehension Verbalized understanding;Returned demonstration  PT Short Term Goals - 09/05/20 1051       PT SHORT TERM GOAL #1   Title Patient will be independent with initial HEP and self-management strategies to improve functional outcomes    Time 3    Period Weeks    Status New    Target Date 09/26/20               PT Long Term Goals - 09/05/20 1051       PT LONG TERM GOAL #1   Title Patient will improve FOTO score to predicted value to indicate improvement in functional outcomes    Time 6    Period Weeks    Status New    Target Date 10/17/20      PT LONG TERM GOAL #2   Title Patient will be able to walk >1 mile with good mechanics and no increased pain for return to exercise and help with functional work related tasks.    Time 6    Period Weeks    Status New    Target Date 10/17/20      PT LONG TERM GOAL #3   Title Patient will be able  to ascend/ descend flight of stairs with reciprocal pattern and no increased knee pain for return to PLOF with ADLs and work tasks    Time 6    Period Weeks    Status New    Target Date 10/17/20      PT LONG TERM GOAL #4   Title Patient will be able to maintain single limb stance on RLE for > 30 seconds on compliant surface to demonstrate improved knee stabilization for ambulating on uneven surfaces.    Time 6    Period Weeks    Status New    Target Date 10/17/20                   Plan - 09/21/20 0831     Clinical Impression Statement Began session with bike for dynamic warm up. Patient with dynamic knee valgus with eccentric phase of stair exercises, improves with verbal cueing. Patient showing improving motor control with lateral and forward step downs but requires UE support for balance. Patient tends to weight shift anteriorly with squat despite verbal cueing. Mechanics improve with chair behind for cueing for shifting posteriorly. Patient educated on probable muscle soreness. Patient will continue to benefit from skilled physical therapy in order to reduce impairment and improve function.    Personal Factors and Comorbidities Past/Current Experience    Examination-Activity Limitations Squat;Stairs;Locomotion Level    Examination-Participation Restrictions Occupation;Yard Work;Community Activity    Stability/Clinical Decision Making Stable/Uncomplicated    Rehab Potential Good    PT Frequency 2x / week    PT Duration 6 weeks    PT Treatment/Interventions ADLs/Self Care Home Management;Aquatic Therapy;Biofeedback;Cryotherapy;Electrical Stimulation;Functional mobility training;Stair training;Therapeutic activities;Iontophoresis 4mg /ml Dexamethasone;Therapeutic exercise;Moist Heat;Traction;Balance training;Manual lymph drainage;Manual techniques;Vasopneumatic Device;Splinting;Taping;Orthotic Fit/Training;Dry needling;Energy conservation;Joint Manipulations;Spinal  Manipulations;Passive range of motion;Scar mobilization;Compression bandaging;Visual/perceptual remediation/compensation;Ultrasound;Parrafin;Fluidtherapy;Contrast Bath;DME Instruction;Gait training;Patient/family education;Neuromuscular re-education    PT Next Visit Plan Progress hip and knee strength as tolerated. Gentle with quad strength per patellar fracture, proceed as tolerated, favor closed chain. Work on pain free quad flixibility. Focus ultimately on stabilization.    PT Home Exercise Plan Eval: quad set, sidelying clam, prone hip extension 6/23 bridge, hamstring curl, heel raise 6/30 band hip abduction, extension, single leg balance 7/7 lateral and forward step down    Consulted and Agree with Plan of Care Patient  Patient will benefit from skilled therapeutic intervention in order to improve the following deficits and impairments:  Abnormal gait, Decreased activity tolerance, Decreased balance, Difficulty walking, Impaired flexibility, Improper body mechanics, Pain, Decreased strength, Increased edema  Visit Diagnosis: Right knee pain, unspecified chronicity  Other abnormalities of gait and mobility     Problem List Patient Active Problem List   Diagnosis Date Noted   Dysosmia 08/16/2020   COVID-19 virus infection 04/06/2020   Acute bacterial rhinosinusitis 04/06/2020   Wellness examination 11/15/2019   Rupture of anterior cruciate ligament of right knee    S/P ACL repair right 12/25/17 05/01/2017   Vasomotor flushing 08/30/2014   Pain in joint, shoulder region 12/14/2013   Muscle weakness (generalized) 12/14/2013   Decreased range of motion of right shoulder 12/14/2013   9:12 AM, 09/21/20 Mearl Latin PT, DPT Physical Therapist at [facility] [place], [place], [address] Phone: [phone]   Fax:  [phone]  Name: Natalie Hess MRN: [id number] Date  of Birth: 04-13-1960

## 2020-09-21 NOTE — Patient Instructions (Signed)
Access Code: [email or web address] URL: [email or web address] Date: 09/21/2020 Prepared by: Mitzi Hansen Cecely Rengel  Exercises Lateral Step Down (Mirrored) - 1 x daily - 7 x weekly - 3 sets - 10 reps Forward Step Down - 1 x daily - 7 x weekly - 3 sets - 10 reps

## 2020-09-26 ENCOUNTER — Encounter (HOSPITAL_COMMUNITY): Payer: No Typology Code available for payment source | Admitting: Physical Therapy

## 2020-09-27 ENCOUNTER — Ambulatory Visit (HOSPITAL_COMMUNITY): Payer: No Typology Code available for payment source

## 2020-09-27 ENCOUNTER — Other Ambulatory Visit: Payer: Self-pay

## 2020-09-27 ENCOUNTER — Encounter (HOSPITAL_COMMUNITY): Payer: Self-pay

## 2020-09-27 DIAGNOSIS — M25561 Pain in right knee: Secondary | ICD-10-CM

## 2020-09-27 DIAGNOSIS — R2689 Other abnormalities of gait and mobility: Secondary | ICD-10-CM

## 2020-09-27 NOTE — Therapy (Signed)
[facility] [address] [place], [place], [address] Phone: [phone]   Fax:  [phone]  Physical Therapy Treatment  Patient Details  Name: Natalie Hess MRN: [id number] Date of Birth: 06-02-1960 Referring Provider (PT): Arther Abbott MD   Encounter Date: 09/27/2020   PT End of Session - 09/27/20 1529     Visit Number 6    Number of Visits 12    Date for PT Re-Evaluation 10/17/20    Authorization Type [employer]    PT Start Time 1523    PT Stop Time 1610    PT Time Calculation (min) 47 min    Activity Tolerance Patient tolerated treatment well    Behavior During Therapy [facility] for tasks assessed/performed             Past Medical History:  Diagnosis Date   Medical history non-contributory    PONV (postoperative nausea and vomiting)    pt states the combination of zofran, decadron and scope patch worked well for her PONV history.    Past Surgical History:  Procedure Laterality Date   ABDOMINAL HYSTERECTOMY     ANTERIOR CRUCIATE LIGAMENT REPAIR Right 12/25/2017   Procedure: KNEE ARTHROSCOPY WITH ANTERIOR CRUCIATE LIGAMENT (ACL) REPAIR with Allograft Achilles,  medial and lateral meniscectomy;  Surgeon: Carole Civil, MD;  Location: [facility];  Service: Orthopedics;  Laterality: Right;   CESAREAN SECTION     X 2   COLONOSCOPY N/A 02/17/2015   Procedure: COLONOSCOPY;  Surgeon: Rogene Houston, MD;  [facility];  Service: Endoscopy;  Laterality: N/A;  930   KNEE ARTHROSCOPY WITH MEDIAL MENISECTOMY Right 04/24/2017   Procedure: KNEE ARTHROSCOPY WITH PARTIAL MEDIAL MENISECTOMY; ACL DEBRIEDMENT;  Surgeon: Carole Civil, MD;  Location: [facility];  Service: Orthopedics;  Laterality: Right;    There were no vitals filed for this visit.   Subjective Assessment - 09/27/20 1527     Subjective Returned to work Monday, stated most difficulty with standing for prolonged periods of time.  Reports occasional  jolting when walking inside knee joint since returned to work.    Pertinent History RT ACL reconstruction 2019    Patient Stated Goals Improve stability    Currently in Pain? No/denies                [facility] PT Assessment - 09/27/20 0001       Assessment   Medical Diagnosis RT knee ACL tear    Referring Provider (PT) Arther Abbott MD    Next MD Visit 10/16/20    Prior Therapy Yes      Restrictions   Weight Bearing Restrictions Yes    RLE Weight Bearing Weight bearing as tolerated                           [facility] Adult PT Treatment/Exercise - 09/27/20 0001       Knee/Hip Exercises: Aerobic   Recumbent Bike 5 min dynamaic warmup Lv 3 seat 8      Knee/Hip Exercises: Machines for Strengthening   Cybex Leg Press 10x 4Pl; 10x 5Pl    Total Gym Leg Press 26 degrees single leg squat 10X2      Knee/Hip Exercises: Standing   Heel Raises Both;2 sets;15 reps    Heel Raises Limitations incline slope, SLS with UE support    Forward Lunges Both;15 reps    Forward Lunges Limitations with unilateral UE support  Lateral Step Up Right;2 sets;10 reps;Hand Hold: 1;Step Height: 6"    Lateral Step Up Limitations eccentric control    Forward Step Up Right;15 reps;Hand Hold: 0;Step Height: 6"    Step Down Both;15 reps;Hand Hold: 0;Hand Hold: 1;Step Height: 6"    Step Down Limitations eccentric heel tap    Functional Squat 2 sets;10 reps    Functional Squat Limitations with chair for cueing    Wall Squat 10 reps;10 seconds    Wall Squat Limitations wall slides    SLS with Vectors 5x 10" holds BLE intermittent HHA                      PT Short Term Goals - 09/05/20 1051       PT SHORT TERM GOAL #1   Title Patient will be independent with initial HEP and self-management strategies to improve functional outcomes    Time 3    Period Weeks    Status New    Target Date 09/26/20               PT Long Term Goals - 09/05/20 1051       PT LONG TERM  GOAL #1   Title Patient will improve FOTO score to predicted value to indicate improvement in functional outcomes    Time 6    Period Weeks    Status New    Target Date 10/17/20      PT LONG TERM GOAL #2   Title Patient will be able to walk >1 mile with good mechanics and no increased pain for return to exercise and help with functional work related tasks.    Time 6    Period Weeks    Status New    Target Date 10/17/20      PT LONG TERM GOAL #3   Title Patient will be able to ascend/ descend flight of stairs with reciprocal pattern and no increased knee pain for return to PLOF with ADLs and work tasks    Time 6    Period Weeks    Status New    Target Date 10/17/20      PT LONG TERM GOAL #4   Title Patient will be able to maintain single limb stance on RLE for > 30 seconds on compliant surface to demonstrate improved knee stabilization for ambulating on uneven surfaces.    Time 6    Period Weeks    Status New    Target Date 10/17/20                   Plan - 09/27/20 1550     Clinical Impression Statement Pt progressing well towards goals.  Pt able to complete step ups/downs with 6in step height and minimal UE support for balance.  Progressed knee stability with SLS based heel raises and increased hold times with vector stance.  Pt reports she has been compliant with HEP and advancing at home.  Min cueing to improve weight distribution with squats as tendency to shift anteriorly.  Able to complete all exercises with no reoprts of pain.  Pt feels she may be ready for DC to HEP, review goals next session.    Personal Factors and Comorbidities Past/Current Experience    Examination-Activity Limitations Squat;Stairs;Locomotion Level    Examination-Participation Restrictions Occupation;Yard Work;Community Activity    Stability/Clinical Decision Making Stable/Uncomplicated    Clinical Decision Making Low    Rehab Potential Good    PT Frequency 2x /  week    PT Duration 6 weeks     PT Treatment/Interventions ADLs/Self Care Home Management;Aquatic Therapy;Biofeedback;Cryotherapy;Electrical Stimulation;Functional mobility training;Stair training;Therapeutic activities;Iontophoresis 4mg /ml Dexamethasone;Therapeutic exercise;Moist Heat;Traction;Balance training;Manual lymph drainage;Manual techniques;Vasopneumatic Device;Splinting;Taping;Orthotic Fit/Training;Dry needling;Energy conservation;Joint Manipulations;Spinal Manipulations;Passive range of motion;Scar mobilization;Compression bandaging;Visual/perceptual remediation/compensation;Ultrasound;Parrafin;Fluidtherapy;Contrast Bath;DME Instruction;Gait training;Patient/family education;Neuromuscular re-education    PT Next Visit Plan Review goals next session for possible early DC to HEP.  Progress hip and knee strength as tolerated. Gentle with quad strength per patellar fracture, proceed as tolerated, favor closed chain. Work on pain free quad flixibility. Focus ultimately on stabilization.    PT Home Exercise Plan Eval: quad set, sidelying clam, prone hip extension 6/23 bridge, hamstring curl, heel raise 6/30 band hip abduction, extension, single leg balance 7/7 lateral and forward step down    Consulted and Agree with Plan of Care Patient             Patient will benefit from skilled therapeutic intervention in order to improve the following deficits and impairments:  Abnormal gait, Decreased activity tolerance, Decreased balance, Difficulty walking, Impaired flexibility, Improper body mechanics, Pain, Decreased strength, Increased edema  Visit Diagnosis: Right knee pain, unspecified chronicity  Other abnormalities of gait and mobility     Problem List Patient Active Problem List   Diagnosis Date Noted   Dysosmia 08/16/2020   COVID-19 virus infection 04/06/2020   Acute bacterial rhinosinusitis 04/06/2020   Wellness examination 11/15/2019   Rupture of anterior cruciate ligament of right knee    S/P ACL repair  right 12/25/17 05/01/2017   Vasomotor flushing 08/30/2014   Pain in joint, shoulder region 12/14/2013   Muscle weakness (generalized) 12/14/2013   Decreased range of motion of right shoulder 12/14/2013   Ihor Austin, LPTA/CLT; CBIS [phone]  Aldona Lento 09/27/2020, 6:07 PM  [facility] [place], [place], [address] Phone: [phone]   Fax:  [phone]  Name: Natalie Hess MRN: [id number] Date of Birth: 11-24-1960

## 2020-09-28 ENCOUNTER — Encounter (HOSPITAL_COMMUNITY): Payer: No Typology Code available for payment source | Admitting: Physical Therapy

## 2020-09-29 ENCOUNTER — Other Ambulatory Visit: Payer: Self-pay

## 2020-09-29 ENCOUNTER — Ambulatory Visit (HOSPITAL_COMMUNITY): Payer: No Typology Code available for payment source

## 2020-09-29 ENCOUNTER — Encounter (HOSPITAL_COMMUNITY): Payer: Self-pay

## 2020-09-29 DIAGNOSIS — M25561 Pain in right knee: Secondary | ICD-10-CM | POA: Diagnosis not present

## 2020-09-29 DIAGNOSIS — R2689 Other abnormalities of gait and mobility: Secondary | ICD-10-CM

## 2020-09-29 NOTE — Therapy (Signed)
[facility] [address] [place], [place], [address] Phone: [phone]   Fax:  [phone]  Physical Therapy Treatment  Patient Details  Name: Natalie Hess MRN: [id number] Date of Birth: [birth year] Referring Provider (PT): Arther Abbott MD   Encounter Date: 09/29/2020   PT End of Session - 09/29/20 1545     Visit Number 7    Number of Visits 12    Date for PT Re-Evaluation 10/17/20    Authorization Type [employer]    PT Start Time 1519    PT Stop Time 1604    PT Time Calculation (min) 45 min    Activity Tolerance Patient tolerated treatment well    Behavior During Therapy [facility] for tasks assessed/performed             Past Medical History:  Diagnosis Date   Medical history non-contributory    PONV (postoperative nausea and vomiting)    pt states the combination of zofran, decadron and scope patch worked well for her PONV history.    Past Surgical History:  Procedure Laterality Date   ABDOMINAL HYSTERECTOMY     ANTERIOR CRUCIATE LIGAMENT REPAIR Right 12/25/2017   Procedure: KNEE ARTHROSCOPY WITH ANTERIOR CRUCIATE LIGAMENT (ACL) REPAIR with Allograft Achilles,  medial and lateral meniscectomy;  Surgeon: Carole Civil, MD;  Location: [facility];  Service: Orthopedics;  Laterality: Right;   CESAREAN SECTION     X 2   COLONOSCOPY N/A 02/17/2015   Procedure: COLONOSCOPY;  Surgeon: Rogene Houston, MD;  [facility];  Service: Endoscopy;  Laterality: N/A;  930   KNEE ARTHROSCOPY WITH MEDIAL MENISECTOMY Right 04/24/2017   Procedure: KNEE ARTHROSCOPY WITH PARTIAL MEDIAL MENISECTOMY; ACL DEBRIEDMENT;  Surgeon: Carole Civil, MD;  Location: [facility];  Service: Orthopedics;  Laterality: Right;    There were no vitals filed for this visit.   Subjective Assessment - 09/29/20 1523     Subjective Pt notes some soreness and fatigue in right knee with her job duties.  Some swelling occurs but minimal pain     Pertinent History RT ACL reconstruction 2019    Patient Stated Goals Improve stability                [facility] PT Assessment - 09/29/20 0001       Assessment   Medical Diagnosis RT knee ACL tear    Referring Provider (PT) Arther Abbott MD    Next MD Visit 10/16/20    Prior Therapy Yes      Observation/Other Assessments   Focus on Therapeutic Outcomes (FOTO)  87.9% function                           [facility] Adult PT Treatment/Exercise - 09/29/20 0001       Knee/Hip Exercises: Standing   Functional Squat 2 sets;10 reps    Functional Squat Limitations slope squat 10 lbs    SLS 2x30 sec    Gait Training resisted retro-forward walking x 3 plates x 2 min    Other Standing Knee Exercises tandem stance on foam 4x30 sec      Knee/Hip Exercises: Supine   Knee Flexion Strengthening;Both;2 sets;10 reps    Knee Flexion Limitations bridge+hamstring curl with green stability ball                    PT Education - 09/29/20 1618     Education Details discussion  regarding POC details and fact of meeting all STG/LTG. Pt expresses desire to return to more plyometric based exercise classes. Demonstrated and discussed home-based fitness videos and filtering results to avoid stressful movements e.g. plyometrics, lunging in extremes of ROM, etc    Person(s) Educated Patient    Methods Explanation    Comprehension Verbalized understanding              PT Short Term Goals - 09/29/20 1615       PT SHORT TERM GOAL #2   Title Pt will demo good landing mechanics for drop-jump test to manifest improved dynamic stability for return to fitness class activities    Baseline dynamic valgus with hopping    Time 2    Period Weeks    Target Date 10/13/20               PT Long Term Goals - 09/29/20 1551       PT LONG TERM GOAL #1   Title Patient will improve FOTO score to predicted value to indicate improvement in functional outcomes    Baseline 87.9%  function    Time 6    Period Weeks    Status Achieved      PT LONG TERM GOAL #2   Title Patient will be able to walk >1 mile with good mechanics and no increased pain for return to exercise and help with functional work related tasks.    Baseline able to walk > 1 mile    Time 6    Period Weeks    Status Achieved      PT LONG TERM GOAL #3   Title Patient will be able to ascend/ descend flight of stairs with reciprocal pattern and no increased knee pain for return to PLOF with ADLs and work tasks    Baseline able to ascend/descend 12 stairs with reciprocal pattern    Time 6    Period Weeks    Status Achieved      PT LONG TERM GOAL #4   Title Patient will be able to maintain single limb stance on RLE for > 30 seconds on compliant surface to demonstrate improved knee stabilization for ambulating on uneven surfaces.    Baseline RLE SLS x 30 sec    Time 6    Period Weeks    Status Achieved                   Plan - 09/29/20 1619     Clinical Impression Statement Progressing well with POC details and demo good ability to self-correct dynamic valgus. Pt met all STG/LTG at this time and demo FOTO score 89% function compared to baseline 63%. Improved dynamic stabilization evident by level of activity. Continued sessions indicated to test/analyze more plyometric dynamic stability    Personal Factors and Comorbidities Past/Current Experience    Examination-Activity Limitations Squat;Stairs;Locomotion Level    Examination-Participation Restrictions Occupation;Yard Work;Community Activity    Stability/Clinical Decision Making Stable/Uncomplicated    Rehab Potential Good    PT Frequency 2x / week    PT Duration 6 weeks    PT Treatment/Interventions ADLs/Self Care Home Management;Aquatic Therapy;Biofeedback;Cryotherapy;Electrical Stimulation;Functional mobility training;Stair training;Therapeutic activities;Iontophoresis 4mg /ml Dexamethasone;Therapeutic exercise;Moist  Heat;Traction;Balance training;Manual lymph drainage;Manual techniques;Vasopneumatic Device;Splinting;Taping;Orthotic Fit/Training;Dry needling;Energy conservation;Joint Manipulations;Spinal Manipulations;Passive range of motion;Scar mobilization;Compression bandaging;Visual/perceptual remediation/compensation;Ultrasound;Parrafin;Fluidtherapy;Contrast Bath;DME Instruction;Gait training;Patient/family education;Neuromuscular re-education    PT Next Visit Plan assess for dynamic stabilization, e.g. drop-jump, single limb hop    PT Home Exercise Plan Eval: quad set, sidelying clam,  prone hip extension 6/23 bridge, hamstring curl, heel raise 6/30 band hip abduction, extension, single leg balance 7/7 lateral and forward step down. Supine bridge+H/S curl with ball    Consulted and Agree with Plan of Care Patient             Patient will benefit from skilled therapeutic intervention in order to improve the following deficits and impairments:  Abnormal gait, Decreased activity tolerance, Decreased balance, Difficulty walking, Impaired flexibility, Improper body mechanics, Pain, Decreased strength, Increased edema  Visit Diagnosis: Right knee pain, unspecified chronicity  Other abnormalities of gait and mobility     Problem List Patient Active Problem List   Diagnosis Date Noted   Dysosmia 08/16/2020   COVID-19 virus infection 04/06/2020   Acute bacterial rhinosinusitis 04/06/2020   Wellness examination 11/15/2019   Rupture of anterior cruciate ligament of right knee    S/P ACL repair right 12/25/17 05/01/2017   Vasomotor flushing 08/30/2014   Pain in joint, shoulder region 12/14/2013   Muscle weakness (generalized) 12/14/2013   Decreased range of motion of right shoulder 12/14/2013   4:23 PM, 09/29/20 M. Sherlyn Lees, PT, DPT Physical Therapist- [place] Office Number: [phone]   [facility] [address] [place], [place],  [address] Phone: [phone]   Fax:  [phone]  Name: Natalie Hess MRN: [id number] Date of Birth: 29-Sep-1960

## 2020-10-03 ENCOUNTER — Encounter (HOSPITAL_COMMUNITY): Payer: No Typology Code available for payment source | Admitting: Physical Therapy

## 2020-10-03 ENCOUNTER — Other Ambulatory Visit (HOSPITAL_COMMUNITY): Payer: Self-pay

## 2020-10-04 ENCOUNTER — Other Ambulatory Visit: Payer: Self-pay

## 2020-10-04 ENCOUNTER — Ambulatory Visit (HOSPITAL_COMMUNITY): Payer: No Typology Code available for payment source

## 2020-10-04 DIAGNOSIS — R2689 Other abnormalities of gait and mobility: Secondary | ICD-10-CM

## 2020-10-04 DIAGNOSIS — M25561 Pain in right knee: Secondary | ICD-10-CM

## 2020-10-04 NOTE — Therapy (Signed)
[facility] [address] [place], [place], [address] Phone: [phone]   Fax:  [phone]  Physical Therapy Treatment  Patient Details  Name: DEVYNE HAUGER MRN: [id number] Date of Birth: 1961/01/07 Referring Provider (PT): Arther Abbott MD   Encounter Date: 10/04/2020   PT End of Session - 10/04/20 1645     Visit Number 8    Number of Visits 12    Date for PT Re-Evaluation 10/17/20    Authorization Type [employer]    PT Start Time 1645    PT Stop Time 1730    PT Time Calculation (min) 45 min    Activity Tolerance Patient tolerated treatment well    Behavior During Therapy [facility] for tasks assessed/performed             Past Medical History:  Diagnosis Date   Medical history non-contributory    PONV (postoperative nausea and vomiting)    pt states the combination of zofran, decadron and scope patch worked well for her PONV history.    Past Surgical History:  Procedure Laterality Date   ABDOMINAL HYSTERECTOMY     ANTERIOR CRUCIATE LIGAMENT REPAIR Right 12/25/2017   Procedure: KNEE ARTHROSCOPY WITH ANTERIOR CRUCIATE LIGAMENT (ACL) REPAIR with Allograft Achilles,  medial and lateral meniscectomy;  Surgeon: Carole Civil, MD;  [facility];  Service: Orthopedics;  Laterality: Right;   CESAREAN SECTION     X 2   COLONOSCOPY N/A 02/17/2015   Procedure: COLONOSCOPY;  Surgeon: Rogene Houston, MD;  [facility];  Service: Endoscopy;  Laterality: N/A;  930   KNEE ARTHROSCOPY WITH MEDIAL MENISECTOMY Right 04/24/2017   Procedure: KNEE ARTHROSCOPY WITH PARTIAL MEDIAL MENISECTOMY; ACL DEBRIEDMENT;  Surgeon: Carole Civil, MD;  Location: [facility];  Service: Orthopedics;  Laterality: Right;    There were no vitals filed for this visit.   Subjective Assessment - 10/04/20 1646     Subjective No issues since returning to work, no increase in pain or swelling.    Pertinent History RT ACL reconstruction 2019     Patient Stated Goals Improve stability                               [facility] Adult PT Treatment/Exercise - 10/04/20 0001       Knee/Hip Exercises: Machines for Strengthening   Cybex Knee Flexion 5.5 plates 3x10      Knee/Hip Exercises: Standing   Knee Flexion Strengthening;Right;3 sets;10 reps    Knee Flexion Limitations green t-band    Forward Lunges 3 sets;10 reps    Forward Lunges Limitations with unilateral UE support    Wall Squat 3 sets;10 reps    Wall Squat Limitations wall with ball behind back    SLS performing ball toss, around the body, halo 2 kg    Gait Training resisted retro-forward walking x 4,5 plates x 2 min    Other Standing Knee Exercises sidestepping with green loop x 2 min                    PT Education - 10/04/20 1731     Education Details education/demonstration of HEP additions    Person(s) Educated Patient    Methods Explanation;Handout    Comprehension Verbalized understanding;Returned demonstration              PT Short Term Goals - 09/29/20 1615  PT SHORT TERM GOAL #2   Title Pt will demo good landing mechanics for drop-jump test to manifest improved dynamic stability for return to fitness class activities    Baseline dynamic valgus with hopping    Time 2    Period Weeks    Target Date 10/13/20               PT Long Term Goals - 09/29/20 1551       PT LONG TERM GOAL #1   Title Patient will improve FOTO score to predicted value to indicate improvement in functional outcomes    Baseline 87.9% function    Time 6    Period Weeks    Status Achieved      PT LONG TERM GOAL #2   Title Patient will be able to walk >1 mile with good mechanics and no increased pain for return to exercise and help with functional work related tasks.    Baseline able to walk > 1 mile    Time 6    Period Weeks    Status Achieved      PT LONG TERM GOAL #3   Title Patient will be able to ascend/ descend flight of  stairs with reciprocal pattern and no increased knee pain for return to PLOF with ADLs and work tasks    Baseline able to ascend/descend 12 stairs with reciprocal pattern    Time 6    Period Weeks    Status Achieved      PT LONG TERM GOAL #4   Title Patient will be able to maintain single limb stance on RLE for > 30 seconds on compliant surface to demonstrate improved knee stabilization for ambulating on uneven surfaces.    Baseline RLE SLS x 30 sec    Time 6    Period Weeks    Status Achieved                   Plan - 10/04/20 1729     Clinical Impression Statement Demonstrates symmetric take-off but LLE bias in landing, this was corrected with verbal cue. Demonstrating improved right knee stability with no dynamic valgus noted during loading response activities. Tolerating increase hip abductor and hamstring strengthening exercises without issue. Continued POC indicated to progress strength, plyometric capabilities, and refine HEP for maximum benefit    Personal Factors and Comorbidities Past/Current Experience    Examination-Activity Limitations Squat;Stairs;Locomotion Level    Examination-Participation Restrictions Occupation;Yard Work;Community Activity    Stability/Clinical Decision Making Stable/Uncomplicated    Rehab Potential Good    PT Frequency 2x / week    PT Duration 6 weeks    PT Treatment/Interventions ADLs/Self Care Home Management;Aquatic Therapy;Biofeedback;Cryotherapy;Electrical Stimulation;Functional mobility training;Stair training;Therapeutic activities;Iontophoresis 4mg /ml Dexamethasone;Therapeutic exercise;Moist Heat;Traction;Balance training;Manual lymph drainage;Manual techniques;Vasopneumatic Device;Splinting;Taping;Orthotic Fit/Training;Dry needling;Energy conservation;Joint Manipulations;Spinal Manipulations;Passive range of motion;Scar mobilization;Compression bandaging;Visual/perceptual remediation/compensation;Ultrasound;Parrafin;Fluidtherapy;Contrast  Bath;DME Instruction;Gait training;Patient/family education;Neuromuscular re-education    PT Next Visit Plan assess for dynamic stabilization, e.g. drop-jump, single limb hop    PT Home Exercise Plan Eval: quad set, sidelying clam, prone hip extension 6/23 bridge, hamstring curl, heel raise 6/30 band hip abduction, extension, single leg balance 7/7 lateral and forward step down. Supine bridge+H/S curl with ball. Standing hip abduction holding ball against wall    Consulted and Agree with Plan of Care Patient             Patient will benefit from skilled therapeutic intervention in order to improve the following deficits and impairments:  Abnormal gait, Decreased activity  tolerance, Decreased balance, Difficulty walking, Impaired flexibility, Improper body mechanics, Pain, Decreased strength, Increased edema  Visit Diagnosis: Right knee pain, unspecified chronicity  Other abnormalities of gait and mobility     Problem List Patient Active Problem List   Diagnosis Date Noted   Dysosmia 08/16/2020   COVID-19 virus infection 04/06/2020   Acute bacterial rhinosinusitis 04/06/2020   Wellness examination 11/15/2019   Rupture of anterior cruciate ligament of right knee    S/P ACL repair right 12/25/17 05/01/2017   Vasomotor flushing 08/30/2014   Pain in joint, shoulder region 12/14/2013   Muscle weakness (generalized) 12/14/2013   Decreased range of motion of right shoulder 12/14/2013   5:33 PM, 10/04/20 M. Sherlyn Lees, PT, DPT Physical Therapist- [place] Office Number: [phone]   [facility] [address] [place], [place], [address] Phone: [phone]   Fax:  [phone]  Name: NINETTE COTTA MRN: [id number] Date of Birth: 1960-08-15

## 2020-10-04 NOTE — Patient Instructions (Signed)
Access Code: [email or web address] URL: [email or web address] Date: 10/04/2020 Prepared by: [place] with Swiss Ball - 1 x daily - 7 x weekly - 3 sets - 10 reps Standing Isometric Hip Abduction with Ball on Wall - 1 x daily - 7 x weekly - 3 sets - 10 reps Side Stepping with Resistance at Ankles - 1 x daily - 7 x weekly - 3 sets - 10 reps Prone Hamstring Curl with Anchored Resistance - 1 x daily - 7 x weekly - 3 sets - 10 reps

## 2020-10-05 ENCOUNTER — Encounter (HOSPITAL_COMMUNITY): Payer: No Typology Code available for payment source | Admitting: Physical Therapy

## 2020-10-06 ENCOUNTER — Ambulatory Visit (HOSPITAL_COMMUNITY): Payer: No Typology Code available for payment source

## 2020-10-06 ENCOUNTER — Other Ambulatory Visit: Payer: Self-pay

## 2020-10-06 DIAGNOSIS — M25561 Pain in right knee: Secondary | ICD-10-CM | POA: Diagnosis not present

## 2020-10-06 DIAGNOSIS — R2689 Other abnormalities of gait and mobility: Secondary | ICD-10-CM

## 2020-10-06 NOTE — Patient Instructions (Signed)
Access Code: [email or web address] URL: [email or web address] Date: 10/06/2020 Prepared by: Sherlyn Lees  Exercises Sumo Squat with Dumbbell - 1 x daily - 7 x weekly - 3 sets - 10 reps Standing Hip Hinge with Dowel - 1 x daily - 7 x weekly - 3 sets - 10 reps Half Deadlift with Kettlebell - 1 x daily - 7 x weekly - 3 sets - 10 reps

## 2020-10-06 NOTE — Therapy (Signed)
[facility] [address] [place], [place], [address] Phone: [phone]   Fax:  [phone]  Physical Therapy Treatment  Patient Details  Name: Natalie Hess MRN: [id number] Date of Birth: 1961-02-21 Referring Provider (PT): Arther Abbott MD   Encounter Date: 10/06/2020   PT End of Session - 10/06/20 1458     Visit Number 9    Number of Visits 12    Date for PT Re-Evaluation 10/17/20    Authorization Type [employer]    PT Start Time 1455    PT Stop Time 1540    PT Time Calculation (min) 45 min    Activity Tolerance Patient tolerated treatment well    Behavior During Therapy [facility] for tasks assessed/performed             Past Medical History:  Diagnosis Date   Medical history non-contributory    PONV (postoperative nausea and vomiting)    pt states the combination of zofran, decadron and scope patch worked well for her PONV history.    Past Surgical History:  Procedure Laterality Date   ABDOMINAL HYSTERECTOMY     ANTERIOR CRUCIATE LIGAMENT REPAIR Right 12/25/2017   Procedure: KNEE ARTHROSCOPY WITH ANTERIOR CRUCIATE LIGAMENT (ACL) REPAIR with Allograft Achilles,  medial and lateral meniscectomy;  Surgeon: Carole Civil, MD;  [facility];  Service: Orthopedics;  Laterality: Right;   CESAREAN SECTION     X 2   COLONOSCOPY N/A 02/17/2015   Procedure: COLONOSCOPY;  Surgeon: Rogene Houston, MD;  [facility];  Service: Endoscopy;  Laterality: N/A;  930   KNEE ARTHROSCOPY WITH MEDIAL MENISECTOMY Right 04/24/2017   Procedure: KNEE ARTHROSCOPY WITH PARTIAL MEDIAL MENISECTOMY; ACL DEBRIEDMENT;  Surgeon: Carole Civil, MD;  Location: [facility];  Service: Orthopedics;  Laterality: Right;    There were no vitals filed for this visit.   Subjective Assessment - 10/06/20 1457     Subjective No issues    Pertinent History RT ACL reconstruction 2019    Patient Stated Goals Improve stability    Currently in  Pain? No/denies                [facility] PT Assessment - 10/06/20 0001       Assessment   Medical Diagnosis RT knee ACL tear    Referring Provider (PT) Arther Abbott MD    Next MD Visit 10/16/20    Prior Therapy Yes                           [facility] Adult PT Treatment/Exercise - 10/06/20 0001       Ambulation/Gait   Gait Comments retro-walking treadmill 0.5 mph x 4 min      Knee/Hip Exercises: Machines for Strengthening   Cybex Knee Flexion 6.5 plates 3x10      Knee/Hip Exercises: Standing   Functional Squat 3 sets;10 reps    Functional Squat Limitations 10# front squat    Gait Training resisted retro-forward 4 plates x 2 min    Other Standing Knee Exercises sumo deadlift 2x10 10# dumbell    Other Standing Knee Exercises unilateral carry 10# performing march 3x10. Hip hinge with PVC 2x10. [place] deadlift 10# dumbell                    PT Education - 10/06/20 1540     Education Details education/discussion on benefits of closed chain to open chain  activities with emphasis on hamstring and glute recruitment to improve proximal stability to stabilize ACL-deficient knee    Person(s) Educated Patient    Methods Explanation;Handout    Comprehension Verbalized understanding              PT Short Term Goals - 09/29/20 1615       PT SHORT TERM GOAL #2   Title Pt will demo good landing mechanics for drop-jump test to manifest improved dynamic stability for return to fitness class activities    Baseline dynamic valgus with hopping    Time 2    Period Weeks    Target Date 10/13/20               PT Long Term Goals - 09/29/20 1551       PT LONG TERM GOAL #1   Title Patient will improve FOTO score to predicted value to indicate improvement in functional outcomes    Baseline 87.9% function    Time 6    Period Weeks    Status Achieved      PT LONG TERM GOAL #2   Title Patient will be able to walk >1 mile with good mechanics and no  increased pain for return to exercise and help with functional work related tasks.    Baseline able to walk > 1 mile    Time 6    Period Weeks    Status Achieved      PT LONG TERM GOAL #3   Title Patient will be able to ascend/ descend flight of stairs with reciprocal pattern and no increased knee pain for return to PLOF with ADLs and work tasks    Baseline able to ascend/descend 12 stairs with reciprocal pattern    Time 6    Period Weeks    Status Achieved      PT LONG TERM GOAL #4   Title Patient will be able to maintain single limb stance on RLE for > 30 seconds on compliant surface to demonstrate improved knee stabilization for ambulating on uneven surfaces.    Baseline RLE SLS x 30 sec    Time 6    Period Weeks    Status Achieved                   Plan - 10/06/20 1539     Clinical Impression Statement Demo good form for deadlift and squat maneuevers but with increase in compensatory strategies with increased weight. Use of mirror for visual feedback improves symmetry. Some rotatory instability noted with single leg biased movements on right side improved with verbal tactile cues. Continued POC indicated to progress closed chain/open chain strength/stability    Personal Factors and Comorbidities Past/Current Experience    Examination-Activity Limitations Squat;Stairs;Locomotion Level    Examination-Participation Restrictions Occupation;Yard Work;Community Activity    Stability/Clinical Decision Making Stable/Uncomplicated    Rehab Potential Good    PT Frequency 2x / week    PT Duration 6 weeks    PT Treatment/Interventions ADLs/Self Care Home Management;Aquatic Therapy;Biofeedback;Cryotherapy;Electrical Stimulation;Functional mobility training;Stair training;Therapeutic activities;Iontophoresis '4mg'$ /ml Dexamethasone;Therapeutic exercise;Moist Heat;Traction;Balance training;Manual lymph drainage;Manual techniques;Vasopneumatic Device;Splinting;Taping;Orthotic  Fit/Training;Dry needling;Energy conservation;Joint Manipulations;Spinal Manipulations;Passive range of motion;Scar mobilization;Compression bandaging;Visual/perceptual remediation/compensation;Ultrasound;Parrafin;Fluidtherapy;Contrast Bath;DME Instruction;Gait training;Patient/family education;Neuromuscular re-education    PT Next Visit Plan assess for dynamic stabilization, e.g. drop-jump, single limb hop    PT Home Exercise Plan Eval: quad set, sidelying clam, prone hip extension 6/23 bridge, hamstring curl, heel raise 6/30 band hip abduction, extension, single leg balance 7/7 lateral and forward  step down. Supine bridge+H/S curl with ball. Standing hip abduction holding ball against wall    Consulted and Agree with Plan of Care Patient             Patient will benefit from skilled therapeutic intervention in order to improve the following deficits and impairments:  Abnormal gait, Decreased activity tolerance, Decreased balance, Difficulty walking, Impaired flexibility, Improper body mechanics, Pain, Decreased strength, Increased edema  Visit Diagnosis: Right knee pain, unspecified chronicity  Other abnormalities of gait and mobility     Problem List Patient Active Problem List   Diagnosis Date Noted   Dysosmia 08/16/2020   COVID-19 virus infection 04/06/2020   Acute bacterial rhinosinusitis 04/06/2020   Wellness examination 11/15/2019   Rupture of anterior cruciate ligament of right knee    S/P ACL repair right 12/25/17 05/01/2017   Vasomotor flushing 08/30/2014   Pain in joint, shoulder region 12/14/2013   Muscle weakness (generalized) 12/14/2013   Decreased range of motion of right shoulder 12/14/2013   3:42 PM, 10/06/20 M. Sherlyn Lees, PT, DPT Physical Therapist- [place] Office Number: [phone]   [facility] [address] [place], [place], [address] Phone: [phone]   Fax:  [phone]  Name: Natalie Hess MRN:  [id number] Date of Birth: 02/10/1961

## 2020-10-10 ENCOUNTER — Encounter (HOSPITAL_COMMUNITY): Payer: No Typology Code available for payment source | Admitting: Physical Therapy

## 2020-10-12 ENCOUNTER — Encounter (HOSPITAL_COMMUNITY): Payer: No Typology Code available for payment source | Admitting: Physical Therapy

## 2020-10-12 ENCOUNTER — Ambulatory Visit: Payer: No Typology Code available for payment source | Admitting: Orthopedic Surgery

## 2020-10-16 ENCOUNTER — Other Ambulatory Visit: Payer: Self-pay | Admitting: Orthopedic Surgery

## 2020-10-16 ENCOUNTER — Ambulatory Visit (INDEPENDENT_AMBULATORY_CARE_PROVIDER_SITE_OTHER): Payer: No Typology Code available for payment source | Admitting: Orthopedic Surgery

## 2020-10-16 ENCOUNTER — Encounter: Payer: Self-pay | Admitting: Orthopedic Surgery

## 2020-10-16 ENCOUNTER — Other Ambulatory Visit: Payer: Self-pay

## 2020-10-16 VITALS — BP 139/82 | HR 77 | Ht 66.0 in | Wt 146.1 lb

## 2020-10-16 DIAGNOSIS — T8489XD Other specified complication of internal orthopedic prosthetic devices, implants and grafts, subsequent encounter: Secondary | ICD-10-CM | POA: Diagnosis not present

## 2020-10-16 NOTE — Progress Notes (Signed)
FOLLOW UP   Encounter Diagnosis  Name Primary?   Tear of anterior cruciate ligament graft, subsequent encounter Yes     Chief Complaint  Patient presents with   Knee Pain    R/ DOI 08/17/20/ not really hurting right now, its been hurting some.     Natalie Hess is 60 years old she had an ACL reconstruction with an allograft and then reinjured her knee in June she has undergone physical therapy and has met all physical therapy goals and is currently in a hinged knee brace full range of motion  She has had no major giving out episodes  She still has some pain at the proximal medial tibia  Exam shows normal to trace positive anterior drawer and Lachman sign no swelling.  Recommend functional ACL brace with measurements of 17-1/2 thigh circumference 10 cm above the patella and mid calf of about 14  Follow-up as needed brace for activities

## 2020-11-01 ENCOUNTER — Telehealth: Payer: Self-pay | Admitting: Family Medicine

## 2020-11-01 ENCOUNTER — Other Ambulatory Visit (HOSPITAL_COMMUNITY): Payer: Self-pay

## 2020-11-01 DIAGNOSIS — L509 Urticaria, unspecified: Secondary | ICD-10-CM

## 2020-11-01 MED ORDER — TRIAMCINOLONE ACETONIDE 0.1 % EX CREA
1.0000 "application " | TOPICAL_CREAM | Freq: Two times a day (BID) | CUTANEOUS | 1 refills | Status: DC
  Filled 2020-11-01: qty 60, 30d supply, fill #0

## 2020-11-01 NOTE — Telephone Encounter (Signed)
Please advise. Thank you

## 2020-11-01 NOTE — Telephone Encounter (Signed)
Patient is needing refill on triamcinolone cream called into [facility]

## 2020-11-01 NOTE — Telephone Encounter (Signed)
Refill sent to pharmacy and pt is aware. 

## 2020-11-09 ENCOUNTER — Telehealth: Payer: Self-pay | Admitting: Orthopedic Surgery

## 2020-11-09 NOTE — Telephone Encounter (Signed)
Patient called back to let us know that she contacted [employer] back, and was given the following 2 other companies for braces: [facility] ph#[phone], or [facility] [phone]. Please advise.

## 2020-11-09 NOTE — Telephone Encounter (Signed)
I called [facility] to see if they do custom ACL braces. They do carry off the shelf braces, but not what she needs.   I called [facility] and they can get it, but they will go through [employer]. I called her to advise.   Faxed order to [phone]

## 2020-11-09 NOTE — Telephone Encounter (Signed)
Patient  called to relay the information regarding brace recently ordered through [facility]. States, as Amy advised, she contacted [employer] plan insurance, and was given alternate brace providers, as [facility] is not one of the [employer] for [employer].  Asking if we can order through (1) [employer], ph# [phone], and if not, 3 other providers were mentioned, [facility], [facility], and [facility] Please call patient at either work#[phone] or [phone].

## 2020-11-09 NOTE — Telephone Encounter (Signed)
I called [facility], they do not do Braces there  I have orders for patient.   She may have to pick up and take to one of the other places  Called [facility] to see if they do these braces. They do not.   The places they gave her are for DME such as wheelchairs off the shelf braces.  but do not do specialty braces which is what she needs.   I called her told her to call [facility] back, told her I will call them too tomorrow.

## 2020-11-10 ENCOUNTER — Telehealth: Payer: Self-pay | Admitting: Orthopedic Surgery

## 2020-11-10 NOTE — Telephone Encounter (Signed)
Call received from patient regarding brace - states that she received a message from [employer] representative, [place], with out of pocket cost of $375.00.  Patient is asking if you have any information yet from the last 2 brace providers she had relayed? Ph#(work) [phone]

## 2020-11-10 NOTE — Telephone Encounter (Signed)
We discussed yesterday order was sent, she was going to call.

## 2020-11-15 ENCOUNTER — Telehealth: Payer: Self-pay | Admitting: Family Medicine

## 2020-11-15 DIAGNOSIS — D229 Melanocytic nevi, unspecified: Secondary | ICD-10-CM

## 2020-11-15 NOTE — Telephone Encounter (Signed)
Since i'm only going to be here 2 wks and the schedule is full.  If this is non-emergent then would recommend seeing new provider or going to urgent care if it's bleeding, irritated or infected.   Dr. Lovena Le

## 2020-11-15 NOTE — Telephone Encounter (Signed)
Left message to return call 

## 2020-11-15 NOTE — Telephone Encounter (Signed)
Patient would like a mole taken off her back is this something that Dr. Lovena Le does?  CB# [phone]

## 2020-11-15 NOTE — Telephone Encounter (Signed)
Pls give referral to derm for skin lesion, on back.   Thx.   Dr. Lovena Le

## 2020-11-15 NOTE — Telephone Encounter (Signed)
Referral ordered in Epic. Patient aware.

## 2020-11-15 NOTE — Telephone Encounter (Signed)
Patient stated she made her own appt with Dermatology 9//20/22 at 1:20pm with [employer] NP. Patient has [employer] thru [facility] and requires a referral on file for the visit to be paid- they do not need notes it just has to be on file and she would really like to be able to keep this appt.

## 2020-11-22 ENCOUNTER — Telehealth: Payer: Self-pay | Admitting: Orthopedic Surgery

## 2020-11-22 NOTE — Telephone Encounter (Signed)
Natalie Hess called and stated that we need to call and give the authorization # for the brace.  The authorization # is [id number]  If any questions or problems, call her cell or call her at work is fine.  Thanks

## 2020-11-22 NOTE — Telephone Encounter (Signed)
[facility] [phone] I called to give the auth number patient provided.

## 2020-12-11 ENCOUNTER — Other Ambulatory Visit (HOSPITAL_COMMUNITY): Payer: Self-pay

## 2021-02-13 ENCOUNTER — Other Ambulatory Visit: Payer: Self-pay | Admitting: Nurse Practitioner

## 2021-02-13 ENCOUNTER — Other Ambulatory Visit (HOSPITAL_COMMUNITY): Payer: Self-pay

## 2021-02-13 MED ORDER — ESTRADIOL 0.025 MG/24HR TD PTTW
MEDICATED_PATCH | TRANSDERMAL | 2 refills | Status: DC
  Filled 2021-02-13: qty 24, 84d supply, fill #0

## 2021-03-13 ENCOUNTER — Encounter: Payer: Self-pay | Admitting: Orthopedic Surgery

## 2021-03-13 ENCOUNTER — Other Ambulatory Visit: Payer: Self-pay | Admitting: Orthopedic Surgery

## 2021-03-13 ENCOUNTER — Other Ambulatory Visit (HOSPITAL_COMMUNITY): Payer: Self-pay

## 2021-03-13 DIAGNOSIS — J111 Influenza due to unidentified influenza virus with other respiratory manifestations: Secondary | ICD-10-CM

## 2021-03-13 MED ORDER — OSELTAMIVIR PHOSPHATE 75 MG PO CAPS: 75.0000 mg | ORAL_CAPSULE | Freq: Two times a day (BID) | ORAL | 0 refills | Status: AC

## 2021-03-13 MED ORDER — OSELTAMIVIR PHOSPHATE 75 MG PO CAPS
75.0000 mg | ORAL_CAPSULE | Freq: Two times a day (BID) | ORAL | 0 refills | Status: DC
  Filled 2021-03-13: qty 10, 5d supply, fill #0

## 2021-03-13 NOTE — Progress Notes (Signed)
Meds ordered this encounter  Medications   oseltamivir (TAMIFLU) 75 MG capsule    Sig: Take 1 capsule (75 mg total) by mouth 2 (two) times daily for 5 days.    Dispense:  10 capsule    Refill:  0

## 2021-03-27 ENCOUNTER — Other Ambulatory Visit: Payer: Self-pay

## 2021-03-27 ENCOUNTER — Ambulatory Visit
Admission: EM | Admit: 2021-03-27 | Discharge: 2021-03-27 | Disposition: A | Discharge status: Home / Self Care | Payer: 59 | Attending: Family Medicine | Admitting: Family Medicine

## 2021-03-27 ENCOUNTER — Encounter: Payer: Self-pay | Admitting: Emergency Medicine

## 2021-03-27 DIAGNOSIS — R52 Pain, unspecified: Secondary | ICD-10-CM

## 2021-03-27 DIAGNOSIS — U071 COVID-19: Secondary | ICD-10-CM | POA: Diagnosis not present

## 2021-03-27 MED ORDER — MOLNUPIRAVIR EUA 200MG CAPSULE: 4.0000 | ORAL_CAPSULE | Freq: Two times a day (BID) | ORAL | 0 refills | Status: AC

## 2021-03-27 MED ORDER — PROMETHAZINE-DM 6.25-15 MG/5ML PO SYRP: 5.0000 mL | ORAL_SOLUTION | Freq: Four times a day (QID) | ORAL | 0 refills | Status: DC | PRN

## 2021-03-27 NOTE — ED Provider Notes (Signed)
[facility]    CSN: [id number] Arrival date & time: 03/27/21  1210      History   Chief Complaint Chief Complaint  Patient presents with   Cough    HPI Natalie Hess is a 61 y.o. female.   Presenting today with 1 day history of cough, chest congestion, headache, sore throat, body aches, fatigue.  Denies chest pain, shortness of breath, abdominal pain, known fever, nausea vomiting or diarrhea.  States her husband started with symptoms 1 day before her and tested positive for COVID yesterday.  She denies known chronic pulmonary disease.  Taking Alka-Seltzer cold and sinus with mild temporary relief of symptoms.   Past Medical History:  Diagnosis Date   Medical history non-contributory    PONV (postoperative nausea and vomiting)    pt states the combination of zofran, decadron and scope patch worked well for her PONV history.    Patient Active Problem List   Diagnosis Date Noted   Dysosmia 08/16/2020   COVID-19 virus infection 04/06/2020   Acute bacterial rhinosinusitis 04/06/2020   Wellness examination 11/15/2019   Rupture of anterior cruciate ligament of right knee    S/P ACL repair right 12/25/17 05/01/2017   Vasomotor flushing 08/30/2014   Pain in joint, shoulder region 12/14/2013   Muscle weakness (generalized) 12/14/2013   Decreased range of motion of right shoulder 12/14/2013   Past Surgical History:  Procedure Laterality Date   ABDOMINAL HYSTERECTOMY     ANTERIOR CRUCIATE LIGAMENT REPAIR Right 12/25/2017   Procedure: KNEE ARTHROSCOPY WITH ANTERIOR CRUCIATE LIGAMENT (ACL) REPAIR with Allograft Achilles,  medial and lateral meniscectomy;  Surgeon: Carole Civil, MD;  Location: [facility];  Service: Orthopedics;  Laterality: Right;   CESAREAN SECTION     X 2   COLONOSCOPY N/A 02/17/2015   Procedure: COLONOSCOPY;  Surgeon: Rogene Houston, MD;  Location: [facility];  Service: Endoscopy;  Laterality: N/A;  930   KNEE ARTHROSCOPY WITH MEDIAL  MENISECTOMY Right 04/24/2017   Procedure: KNEE ARTHROSCOPY WITH PARTIAL MEDIAL MENISECTOMY; ACL DEBRIEDMENT;  Surgeon: Carole Civil, MD;  Location: [facility];  Service: Orthopedics;  Laterality: Right;    OB History     Gravida      Para      Term      Preterm      AB      Living  2      SAB      IAB      Ectopic      Multiple      Live Births               Home Medications    Prior to Admission medications   Medication Sig Start Date End Date Taking? Authorizing Provider  aspirin-acetaminophen-caffeine (EXCEDRIN MIGRAINE) [phone] MG tablet Take 2 tablets by mouth every 6 (six) hours as needed (for pain.).   Yes [provider]  estradiol (VIVELLE-DOT) 0.025 MG/24HR PLACE 1 PATCH ONTO THE SKIN TWICE A WEEK. DO NOT APPLY TO THE BREAST AREA. 02/13/21 02/13/22 Yes Cook, Jayce G, DO  ibuprofen (ADVIL,MOTRIN) 800 MG tablet Take 1 tablet (800 mg total) by mouth every 8 (eight) hours as needed. 04/24/17  Yes Carole Civil, MD  molnupiravir EUA (LAGEVRIO) 200 mg CAPS capsule Take 4 capsules (800 mg total) by mouth 2 (two) times daily for 5 days. 03/27/21 04/01/21 Yes Volney American, PA-C  promethazine-dextromethorphan (PROMETHAZINE-DM) 6.25-15 MG/5ML syrup Take 5 mLs by mouth 4 (four)  times daily as needed. 03/27/21  Yes Volney American, PA-C  triamcinolone cream (KENALOG) 0.1 % Apply 1 application topically 2 (two) times daily. 11/01/20  Yes Lovena Le, Malena M, DO  fluticasone (FLONASE) 50 MCG/ACT nasal spray Place 2 sprays into both nostrils daily. 04/06/20   Chalmers Guest, NP    Family History Family History  Problem Relation Age of Onset   Melanoma Mother        died from metastatic disease    Social History Social History   Tobacco Use   Smoking status: Former    Types: Cigarettes   Smokeless tobacco: Never  Vaping Use   Vaping Use: Never used  Substance Use Topics   Alcohol use: Yes    Alcohol/week: 0.0 standard drinks     Comment: socially   Drug use: No     Allergies   Patient has no known allergies.   Review of Systems Review of Systems PER HPI  Physical Exam Triage Vital Signs ED Triage Vitals  Enc Vitals Group     BP 03/27/21 1429 (!) 157/97     Pulse Rate 03/27/21 1429 75     Resp 03/27/21 1429 18     Temp 03/27/21 1429 98.4 F (36.9 C)     Temp Source 03/27/21 1429 Oral     SpO2 03/27/21 1429 96 %     Weight 03/27/21 1430 138 lb (62.6 kg)     Height 03/27/21 1430 5\' 6"  (1.676 m)     Head Circumference --      Peak Flow --      Pain Score 03/27/21 1430 1     Pain Loc --      Pain Edu? --      Excl. in [place]? --    No data found.  Updated Vital Signs BP (!) 157/97 (BP Location: Right Arm)    Pulse 75    Temp 98.4 F (36.9 C) (Oral)    Resp 18    Ht 5\' 6"  (1.676 m)    Wt 138 lb (62.6 kg)    SpO2 96%    BMI 22.27 kg/m   Visual Acuity Right Eye Distance:   Left Eye Distance:   Bilateral Distance:    Right Eye Near:   Left Eye Near:    Bilateral Near:     Physical Exam Vitals and nursing note reviewed.  Constitutional:      Appearance: Normal appearance.  HENT:     Head: Atraumatic.     Right Ear: Tympanic membrane and external ear normal.     Left Ear: Tympanic membrane and external ear normal.     Nose: Rhinorrhea present.     Mouth/Throat:     Mouth: Mucous membranes are moist.     Pharynx: Posterior oropharyngeal erythema present.  Eyes:     Extraocular Movements: Extraocular movements intact.     Conjunctiva/sclera: Conjunctivae normal.  Cardiovascular:     Rate and Rhythm: Normal rate and regular rhythm.     Heart sounds: Normal heart sounds.  Pulmonary:     Effort: Pulmonary effort is normal.     Breath sounds: Normal breath sounds. No wheezing or rales.  Musculoskeletal:        General: Normal range of motion.     Cervical back: Normal range of motion and neck supple.  Skin:    General: Skin is warm and dry.  Neurological:     Mental Status: She is alert  and oriented to  person, place, and time.     Motor: No weakness.     Gait: Gait normal.  Psychiatric:        Mood and Affect: Mood normal.        Thought Content: Thought content normal.   UC Treatments / Results  Labs (all labs ordered are listed, but only abnormal results are displayed) Labs Reviewed  COVID-19, FLU A+B NAA   EKG  Radiology No results found.  Procedures Procedures (including critical care time)  Medications Ordered in UC Medications - No data to display  Initial Impression / Assessment and Plan / UC Course  I have reviewed the triage vital signs and the nursing notes.  Pertinent labs & imaging results that were available during my care of the patient were reviewed by me and considered in my medical decision making (see chart for details).     Given home exposure to COVID and symptoms, will start antiviral therapy and Phenergan DM while awaiting confirmation via COVID PCR test.  Discussed supportive over-the-counter medications and home care.  Return for acutely worsening symptoms.  Final Clinical Impressions(s) / UC Diagnoses   Final diagnoses:  Generalized body aches  COVID-19   Discharge Instructions   None    ED Prescriptions     Medication Sig Dispense Auth. Provider   molnupiravir EUA (LAGEVRIO) 200 mg CAPS capsule Take 4 capsules (800 mg total) by mouth 2 (two) times daily for 5 days. 40 capsule Volney American, [place]   promethazine-dextromethorphan (PROMETHAZINE-DM) 6.25-15 MG/5ML syrup Take 5 mLs by mouth 4 (four) times daily as needed. 100 mL Volney American, [place]      PDMP not reviewed this encounter.   Volney American, [place] 03/27/21 1512

## 2021-03-27 NOTE — ED Triage Notes (Signed)
Pt reports cough, chest congestion, headache, sore throat since last night. Pt reports spouse seen for same yesterday. spouse tested positive with home test yesterday.

## 2021-03-28 LAB — COVID-19, FLU A+B NAA
Influenza A, NAA: NOT DETECTED
Influenza B, NAA: NOT DETECTED
SARS-CoV-2, NAA: NOT DETECTED

## 2021-04-05 DIAGNOSIS — S134XXA Sprain of ligaments of cervical spine, initial encounter: Secondary | ICD-10-CM | POA: Diagnosis not present

## 2021-04-05 DIAGNOSIS — S233XXA Sprain of ligaments of thoracic spine, initial encounter: Secondary | ICD-10-CM | POA: Diagnosis not present

## 2021-04-09 DIAGNOSIS — S233XXA Sprain of ligaments of thoracic spine, initial encounter: Secondary | ICD-10-CM | POA: Diagnosis not present

## 2021-04-09 DIAGNOSIS — S134XXA Sprain of ligaments of cervical spine, initial encounter: Secondary | ICD-10-CM | POA: Diagnosis not present

## 2021-04-27 ENCOUNTER — Ambulatory Visit: Payer: 59 | Admitting: Orthopedic Surgery

## 2021-04-27 ENCOUNTER — Encounter: Payer: Self-pay | Admitting: Orthopedic Surgery

## 2021-04-27 ENCOUNTER — Ambulatory Visit: Payer: 59

## 2021-04-27 ENCOUNTER — Other Ambulatory Visit: Payer: Self-pay

## 2021-04-27 DIAGNOSIS — M79644 Pain in right finger(s): Secondary | ICD-10-CM

## 2021-04-27 DIAGNOSIS — M1811 Unilateral primary osteoarthritis of first carpometacarpal joint, right hand: Secondary | ICD-10-CM

## 2021-04-27 NOTE — Patient Instructions (Signed)

## 2021-04-27 NOTE — Progress Notes (Signed)
New Patient Visit  Assessment: Natalie Hess is a 61 y.o. left hand dominant female with the following: Arthritis of carpometacarpal [facility]) joint of right thumb  Plan: Radiographs reviewed.  She has advanced degenerative changes within the right [facility] joint.  She can continue with medications as needed.  She is interested in an injection in clinic today.  She currently has a removable thumb spica splint, which I recommended she wear while not at work until the pain improves.  In the future, we can consider a referral to hand therapy for fabrication of a hand-based thumb spica splint.  She is in agreement with this plan.  Follow-up as needed.  Procedure note injection - Right Thumb CMC joint  Verbal consent was obtained to inject the Right Thumb CMC joint Timeout was completed to confirm the site of injection.  The skin was prepped with alcohol and ethyl chloride was sprayed at the injection site.  A 21-gauge needle was used to inject 40 mg of Depo-Medrol and 1% lidocaine (1 cc) into the Right Thumb CMC joint using a direct anterior approach.  There were no complications. Patient tolerated the procedure well. A sterile bandage was applied  Follow-up: Return if symptoms worsen or fail to improve.  Subjective:  Chief Complaint  Patient presents with   thumb pain    RT/ painful x 1 wk worsening/ no known injury    History of Present Illness: Natalie Hess is a 61 y.o. female who presents for evaluation of right thumb pain.  She does have a history of right thumb CMC arthritis.  She received an injection several years ago.  Over the past week, she has noted progressively worsening pain in this area.  She denies a specific injury.  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  She is only taking medications as needed at this time.  She does not use a brace, but does have a thumb spica brace available   Review of Systems: No fevers or chills No numbness or tingling No  chest pain No shortness of breath No bowel or bladder dysfunction No GI distress No headaches   Medical History:  Past Medical History:  Diagnosis Date   Medical history non-contributory    PONV (postoperative nausea and vomiting)    pt states the combination of zofran, decadron and scope patch worked well for her PONV history.    Past Surgical History:  Procedure Laterality Date   ABDOMINAL HYSTERECTOMY     ANTERIOR CRUCIATE LIGAMENT REPAIR Right 12/25/2017   Procedure: KNEE ARTHROSCOPY WITH ANTERIOR CRUCIATE LIGAMENT (ACL) REPAIR with Allograft Achilles,  medial and lateral meniscectomy;  Surgeon: Carole Civil, MD;  Location: [facility];  Service: Orthopedics;  Laterality: Right;   CESAREAN SECTION     X 2   COLONOSCOPY N/A 02/17/2015   Procedure: COLONOSCOPY;  Surgeon: Rogene Houston, MD;  [facility];  Service: Endoscopy;  Laterality: N/A;  930   KNEE ARTHROSCOPY WITH MEDIAL MENISECTOMY Right 04/24/2017   Procedure: KNEE ARTHROSCOPY WITH PARTIAL MEDIAL MENISECTOMY; ACL DEBRIEDMENT;  Surgeon: Carole Civil, MD;  Location: [facility];  Service: Orthopedics;  Laterality: Right;    Family History  Problem Relation Age of Onset   Melanoma Mother        died from metastatic disease   Social History   Tobacco Use   Smoking status: Former    Types: Cigarettes   Smokeless tobacco: Never  Vaping Use  Vaping Use: Never used  Substance Use Topics   Alcohol use: Yes    Alcohol/week: 0.0 standard drinks    Comment: socially   Drug use: No    No Known Allergies  Current Meds  Medication Sig   aspirin-acetaminophen-caffeine (EXCEDRIN MIGRAINE) 250-250-65 MG tablet Take 2 tablets by mouth every 6 (six) hours as needed (for pain.).   estradiol (VIVELLE-DOT) 0.025 MG/24HR PLACE 1 PATCH ONTO THE SKIN TWICE A WEEK. DO NOT APPLY TO THE BREAST AREA.   fluticasone (FLONASE) 50 MCG/ACT nasal spray Place 2 sprays into both nostrils daily.   ibuprofen (ADVIL,MOTRIN)  800 MG tablet Take 1 tablet (800 mg total) by mouth every 8 (eight) hours as needed.   triamcinolone cream (KENALOG) 0.1 % Apply 1 application topically 2 (two) times daily.    Objective: There were no vitals taken for this visit.  Physical Exam:  General: Alert and oriented. and No acute distress. Gait: Normal gait.  Evaluation of the right thumb demonstrates deformity consistent with advanced CMC arthritis.  The thumb CMC joint is enlarged.  Positive grind test.  Tenderness to deep palpation within the joint.  Hyperextension at the IP joint.  Fingers are warm and well-perfused.  Sensation is intact throughout the right hand  IMAGING: I personally ordered and reviewed the following images  X-rays of the right thumb were obtained in clinic today.  She has advanced degenerative changes at the [facility] joint.  There are cysts within the base of the first metacarpal.  Osteophytes are apparent.  Impression: Advanced right thumb CMC arthritis   New Medications:  No orders of the defined types were placed in this encounter.     Mordecai Rasmussen, MD  04/27/2021 11:27 AM

## 2021-05-27 IMAGING — MG DIGITAL SCREENING BILAT W/ TOMO W/ CAD
8 series · 9 of 24 positions shown · non-contrast
Comparison: None.

CLINICAL DATA: Screening.

EXAM:
DIGITAL SCREENING BILATERAL MAMMOGRAM WITH TOMO AND CAD

[R MLO synth-2D]
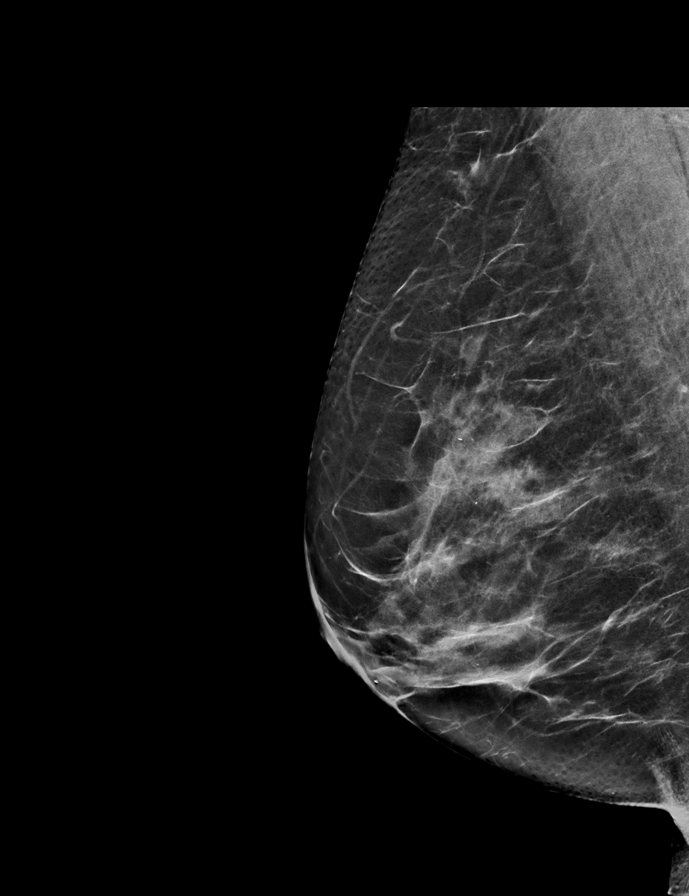

[R CC synth-2D]
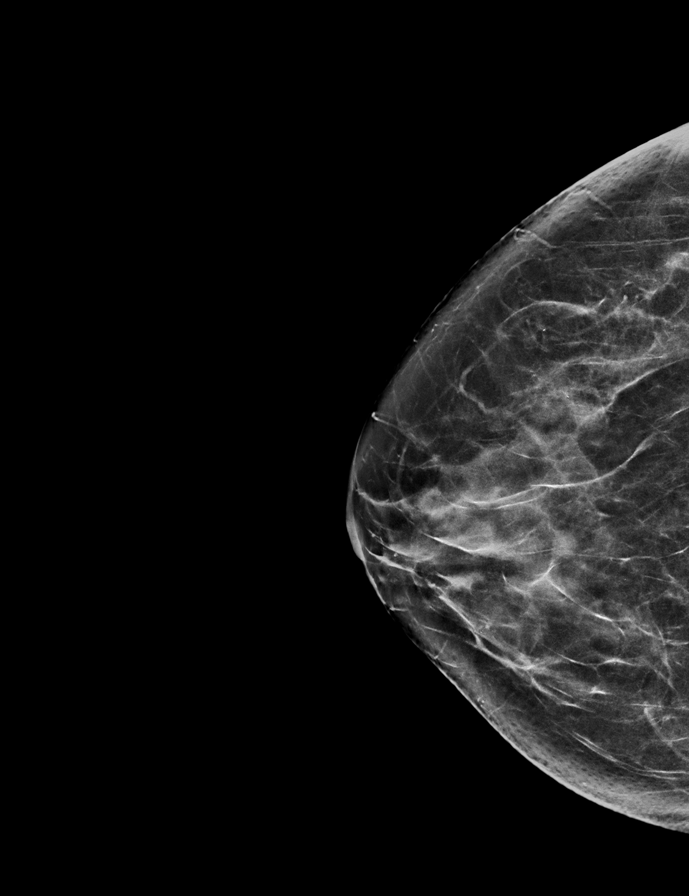

[L CC synth-2D]
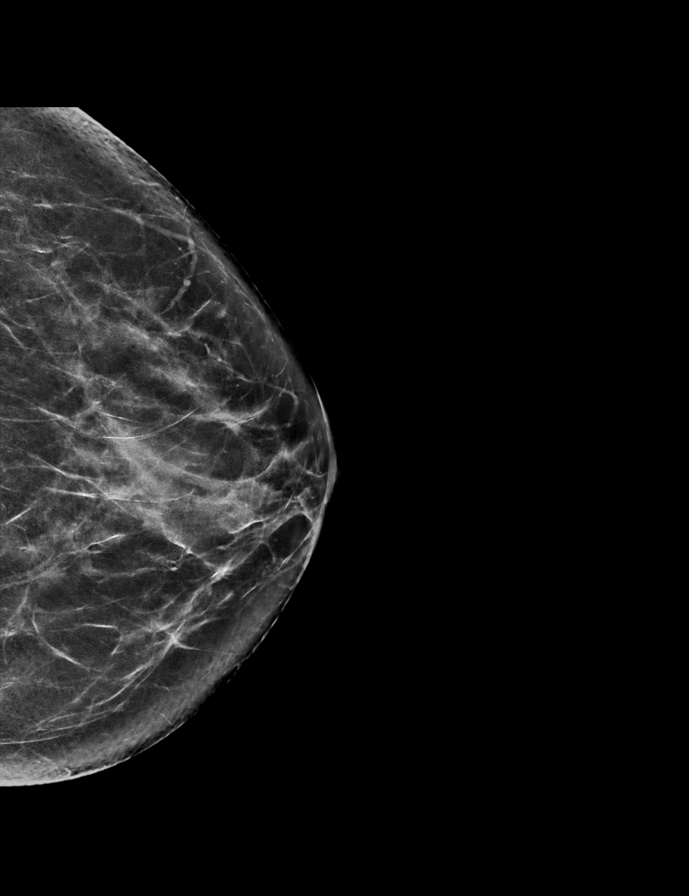

[L MLO synth-2D]
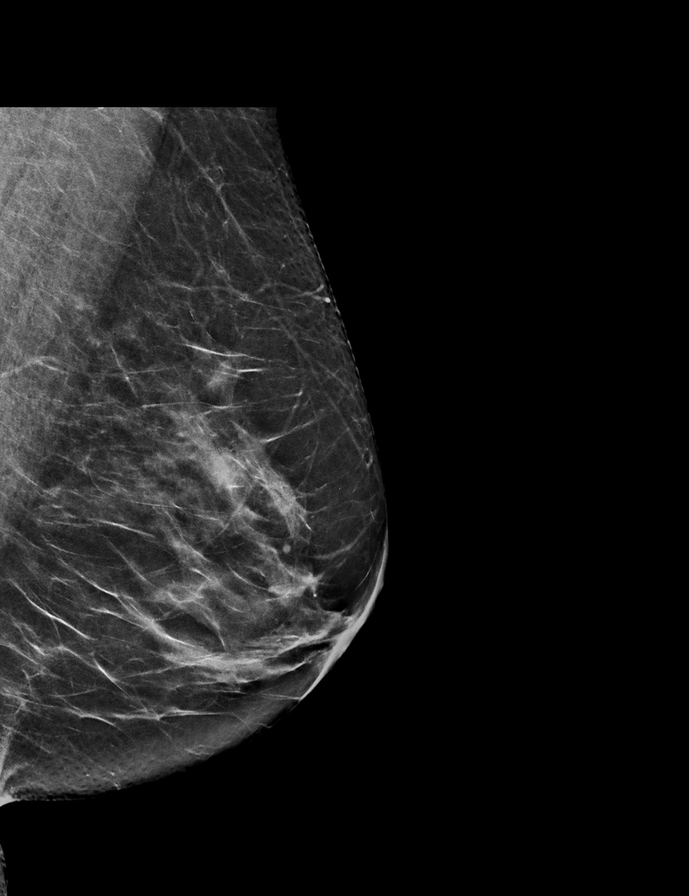

[R CC tomo · 2 of 65 frames shown]
[frame 21/65]
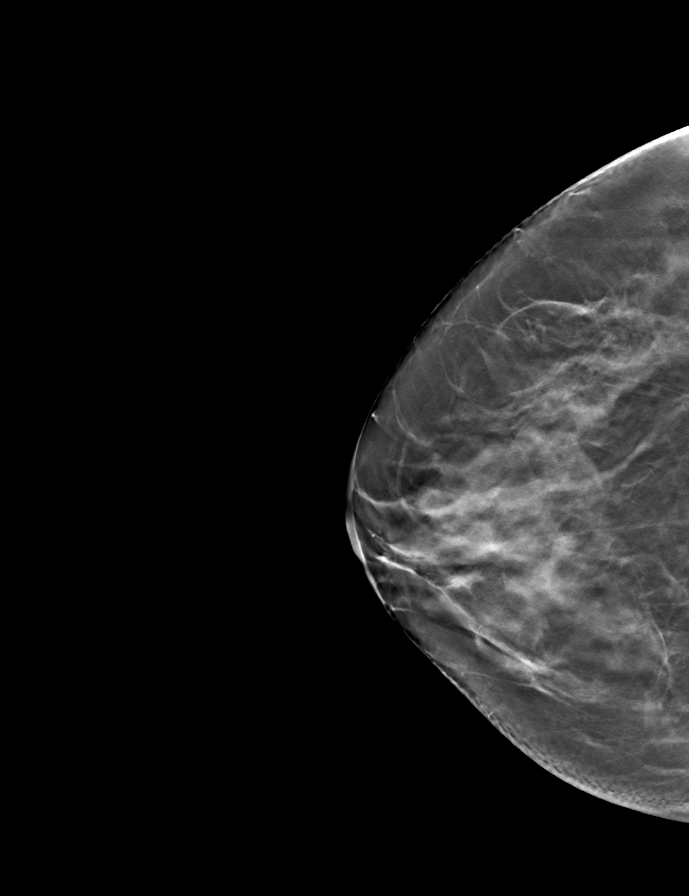
[frame 33/65]
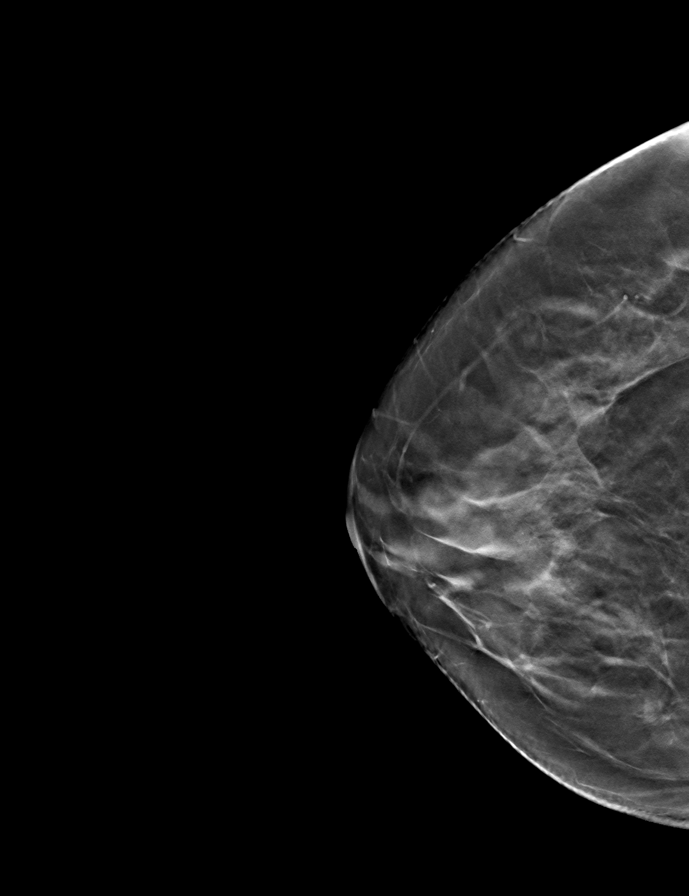

[R MLO tomo · tomo slice 31/62.0]
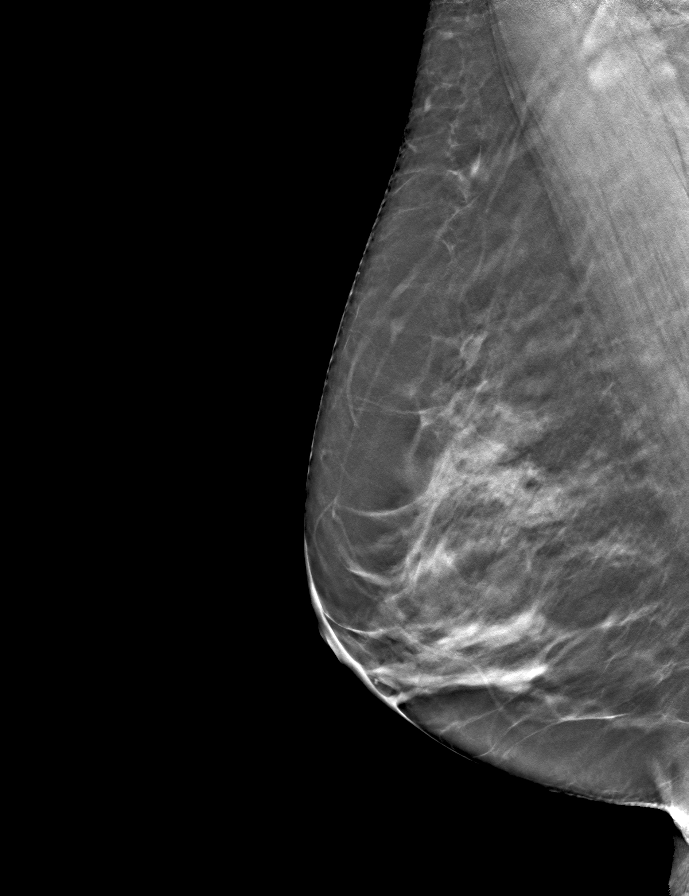

[L CC tomo · tomo slice 33/64.0]
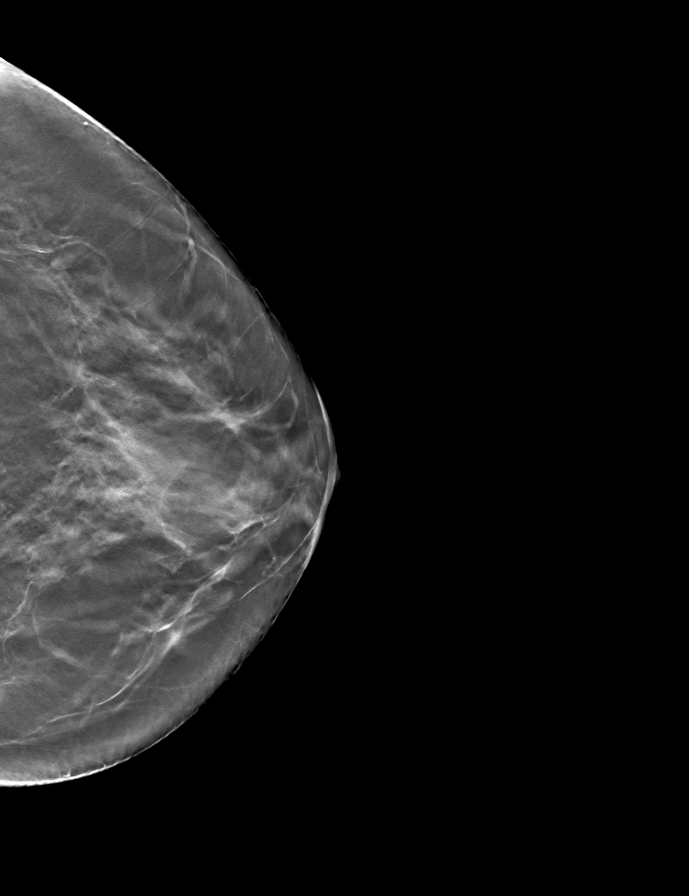

[L MLO tomo · tomo slice 33/66.0]
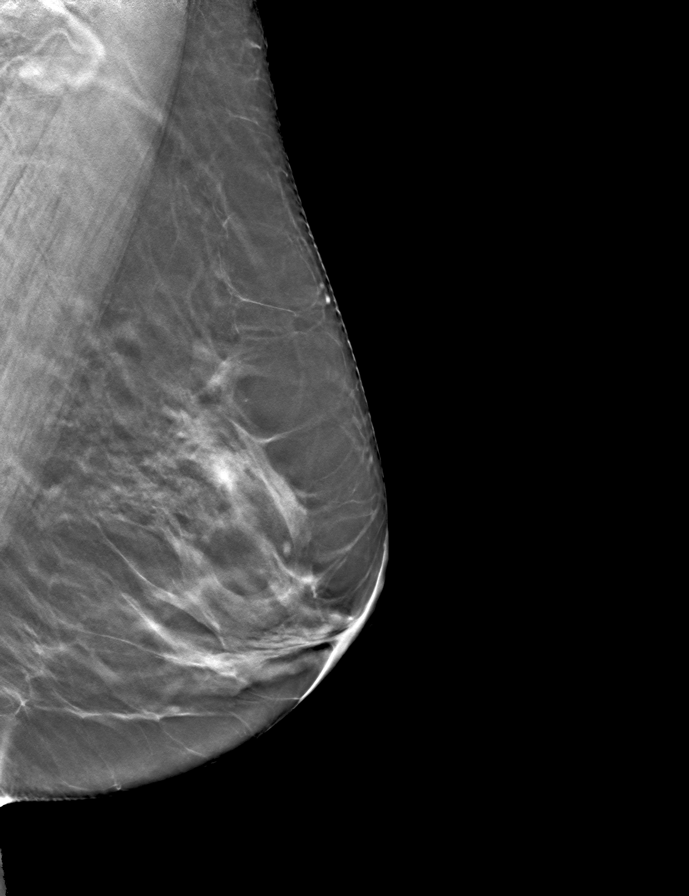

[9 of 24 positions shown; findings below may reference images not displayed]

ACR Breast Density Category c: The breast tissue is heterogeneously
dense, which may obscure small masses
FINDINGS: There are no findings suspicious for malignancy. Images were
processed with CAD.
IMPRESSION: No mammographic evidence of malignancy. A result letter of this
screening mammogram will be mailed directly to the patient.

RECOMMENDATION:
Screening mammogram in one year. (Code:EM-2-IHY)

BI-RADS CATEGORY  1: Negative.

## 2021-06-20 ENCOUNTER — Other Ambulatory Visit (HOSPITAL_COMMUNITY): Payer: Self-pay | Admitting: Family Medicine

## 2021-06-20 ENCOUNTER — Ambulatory Visit (HOSPITAL_COMMUNITY)
Admission: RE | Admit: 2021-06-20 | Discharge: 2021-06-20 | Disposition: A | Discharge status: Home / Self Care | Payer: 59 | Source: Ambulatory Visit | Attending: Family Medicine | Admitting: Family Medicine

## 2021-06-20 DIAGNOSIS — Z1231 Encounter for screening mammogram for malignant neoplasm of breast: Secondary | ICD-10-CM | POA: Diagnosis not present

## 2021-07-15 ENCOUNTER — Other Ambulatory Visit: Payer: Self-pay | Admitting: Family Medicine

## 2021-07-16 NOTE — Telephone Encounter (Signed)
Sent my chart message 51/2023 to schedule appointment ?

## 2021-07-19 ENCOUNTER — Other Ambulatory Visit (HOSPITAL_COMMUNITY): Payer: Self-pay

## 2021-07-28 ENCOUNTER — Other Ambulatory Visit: Payer: Self-pay | Admitting: Family Medicine

## 2021-07-28 ENCOUNTER — Other Ambulatory Visit (HOSPITAL_COMMUNITY): Payer: Self-pay

## 2021-07-30 ENCOUNTER — Other Ambulatory Visit (HOSPITAL_COMMUNITY): Payer: Self-pay

## 2021-08-07 ENCOUNTER — Other Ambulatory Visit (HOSPITAL_COMMUNITY): Payer: Self-pay

## 2021-08-07 ENCOUNTER — Other Ambulatory Visit: Payer: Self-pay | Admitting: Family Medicine

## 2021-08-07 MED ORDER — ESTRADIOL 0.025 MG/24HR TD PTTW
MEDICATED_PATCH | TRANSDERMAL | 2 refills | Status: DC
  Filled 2021-08-07: qty 8, 28d supply, fill #0
  Filled 2021-09-24: qty 8, 28d supply, fill #1
  Filled 2021-10-30: qty 8, 28d supply, fill #2

## 2021-08-09 ENCOUNTER — Ambulatory Visit (INDEPENDENT_AMBULATORY_CARE_PROVIDER_SITE_OTHER): Payer: 59 | Admitting: Family Medicine

## 2021-08-09 VITALS — BP 130/80 | HR 68 | Temp 97.8°F | Ht 66.0 in | Wt 140.4 lb

## 2021-08-09 DIAGNOSIS — Z0000 Encounter for general adult medical examination without abnormal findings: Secondary | ICD-10-CM | POA: Diagnosis not present

## 2021-08-09 DIAGNOSIS — Z13220 Encounter for screening for lipoid disorders: Secondary | ICD-10-CM | POA: Diagnosis not present

## 2021-08-09 DIAGNOSIS — R232 Flushing: Secondary | ICD-10-CM | POA: Diagnosis not present

## 2021-08-09 DIAGNOSIS — Z130 Encounter for screening for diseases of the blood and blood-forming organs and certain disorders involving the immune mechanism: Secondary | ICD-10-CM

## 2021-08-09 NOTE — Patient Instructions (Signed)
Labs when you can.  Follow up annually.  Take care  Dr. Lacinda Axon

## 2021-08-10 LAB — LIPID PANEL
Chol/HDL Ratio: 1.9 ratio (ref 0.0–4.4)
Cholesterol, Total: 199 mg/dL (ref 100–199)
HDL: 106 mg/dL (ref >39)
LDL Chol Calc (NIH): 83 mg/dL (ref 0–99)
Triglycerides: 51 mg/dL (ref 0–149)
VLDL Cholesterol Cal: 10 mg/dL (ref 5–40)

## 2021-08-10 LAB — CBC
Hematocrit: 39.5 % (ref 34.0–46.6)
Hemoglobin: 13.3 g/dL (ref 11.1–15.9)
MCH: 30 pg (ref 26.6–33.0)
MCHC: 33.7 g/dL (ref 31.5–35.7)
MCV: 89 fL (ref 79–97)
Platelets: 226 10*3/uL (ref 150–450)
RBC: 4.44 x10E6/uL (ref 3.77–5.28)
RDW: 11.7 % (ref 11.7–15.4)
WBC: 4.3 10*3/uL (ref 3.4–10.8)

## 2021-08-10 LAB — CMP14+EGFR
ALT: 14 IU/L (ref 0–32)
AST: 21 IU/L (ref 0–40)
Albumin/Globulin Ratio: 2 (ref 1.2–2.2)
Albumin: 4.6 g/dL (ref 3.8–4.8)
Alkaline Phosphatase: 56 IU/L (ref 44–121)
BUN/Creatinine Ratio: 20 (ref 12–28)
BUN: 15 mg/dL (ref 8–27)
Bilirubin Total: 0.5 mg/dL (ref 0.0–1.2)
CO2: 26 mmol/L (ref 20–29)
Calcium: 9.2 mg/dL (ref 8.7–10.3)
Chloride: 99 mmol/L (ref 96–106)
Creatinine, Ser: 0.75 mg/dL (ref 0.57–1.00)
Globulin, Total: 2.3 g/dL (ref 1.5–4.5)
Glucose: 82 mg/dL (ref 70–99)
Potassium: 4.4 mmol/L (ref 3.5–5.2)
Sodium: 138 mmol/L (ref 134–144)
Total Protein: 6.9 g/dL (ref 6.0–8.5)
eGFR: 91 mL/min/{1.73_m2} (ref >59)

## 2021-08-11 DIAGNOSIS — Z0000 Encounter for general adult medical examination without abnormal findings: Secondary | ICD-10-CM | POA: Insufficient documentation

## 2021-08-11 NOTE — Progress Notes (Signed)
Subjective:  Patient ID: Natalie Hess, female    DOB: May 23, 1960  Age: 61 y.o. MRN: [id number]  CC: Chief Complaint  Patient presents with   New Patient (Initial Visit)   Medication Refill    HPI:  61 year old female presents to establish care.  Patient reports that she is doing well. Suffers from hot flashes but is stable on Estradiol patch.  Preventative health care items are up to date.   Denies chest pain, SOB, GI symptoms.   She is in need of labs.  No issues or complaints today.   Patient Active Problem List   Diagnosis Date Noted   Physical exam, routine 08/11/2021   S/P ACL repair right 12/25/17 05/01/2017   Vasomotor flushing 08/30/2014    Social Hx   Social History   Socioeconomic History   Marital status: Married    Spouse name: Not on file   Number of children: Not on file   Years of education: Not on file   Highest education level: Not on file  Occupational History   Not on file  Tobacco Use   Smoking status: Former    Types: Cigarettes   Smokeless tobacco: Never  Vaping Use   Vaping Use: Never used  Substance and Sexual Activity   Alcohol use: Yes    Alcohol/week: 0.0 standard drinks    Comment: socially   Drug use: No   Sexual activity: Yes    Birth control/protection: Surgical  Other Topics Concern   Not on file  Social History Narrative   Not on file   Social Determinants of Health   Financial Resource Strain: Not on file  Food Insecurity: Not on file  Transportation Needs: Not on file  Physical Activity: Not on file  Stress: Not on file  Social Connections: Not on file    Review of Systems Per HPI  Objective:  BP 130/80   Pulse 68   Temp 97.8 F (36.6 C) (Oral)   Ht 5' 6" (1.676 m)   Wt 140 lb 6.4 oz (63.7 kg)   SpO2 97%   BMI 22.66 kg/m      08/09/2021    3:41 PM 08/09/2021    3:17 PM 03/27/2021    2:30 PM  BP/Weight  Systolic BP 950 932   Diastolic BP 80 91   Wt. (Lbs)  140.4 138  BMI  22.66 kg/m2 22.27  kg/m2    Physical Exam Vitals and nursing note reviewed.  Constitutional:      General: She is not in acute distress.    Appearance: Normal appearance. She is not ill-appearing.  HENT:     Head: Normocephalic and atraumatic.  Eyes:     General:        Right eye: No discharge.        Left eye: No discharge.     Conjunctiva/sclera: Conjunctivae normal.  Cardiovascular:     Rate and Rhythm: Normal rate and regular rhythm.     Heart sounds: No murmur heard. Pulmonary:     Effort: Pulmonary effort is normal.     Breath sounds: Normal breath sounds. No wheezing or rales.  Abdominal:     General: There is no distension.     Palpations: Abdomen is soft.     Tenderness: There is no abdominal tenderness.  Musculoskeletal:     Cervical back: Neck supple. No tenderness.  Lymphadenopathy:     Cervical: No cervical adenopathy.  Neurological:     Mental Status:  She is alert.    Lab Results  Component Value Date   WBC 4.3 08/09/2021   HGB 13.3 08/09/2021   HCT 39.5 08/09/2021   PLT 226 08/09/2021   GLUCOSE 82 08/09/2021   CHOL 199 08/09/2021   TRIG 51 08/09/2021   HDL 106 08/09/2021   LDLCALC 83 08/09/2021   ALT 14 08/09/2021   AST 21 08/09/2021   NA 138 08/09/2021   K 4.4 08/09/2021   CL 99 08/09/2021   CREATININE 0.75 08/09/2021   BUN 15 08/09/2021   CO2 26 08/09/2021   TSH 0.948 08/15/2017     Assessment & Plan:   Problem List Items Addressed This Visit       Cardiovascular and Mediastinum   Vasomotor flushing    Patient reluctant to discontinue HRT. Will continue. Labs today.        Relevant Orders   CMP14+EGFR (Completed)     Other   Physical exam, routine - Primary    Doing well. Labs today.  Preventative health care is up to date.        Other Visit Diagnoses     Screening for deficiency anemia       Relevant Orders   CBC (Completed)   Screening, lipid       Relevant Orders   Lipid panel (Completed)      Follow-up:  Annually  Thersa Salt DO [place]

## 2021-08-11 NOTE — Assessment & Plan Note (Signed)
Doing well. Labs today.  Preventative health care is up to date.

## 2021-08-11 NOTE — Assessment & Plan Note (Signed)
Patient reluctant to discontinue HRT. Will continue. Labs today.

## 2021-09-19 ENCOUNTER — Other Ambulatory Visit (INDEPENDENT_AMBULATORY_CARE_PROVIDER_SITE_OTHER): Payer: 59 | Admitting: General Surgery

## 2021-09-19 DIAGNOSIS — Z09 Encounter for follow-up examination after completed treatment for conditions other than malignant neoplasm: Secondary | ICD-10-CM

## 2021-09-19 MED ORDER — OSELTAMIVIR PHOSPHATE 75 MG PO CAPS: 75.0000 mg | ORAL_CAPSULE | Freq: Two times a day (BID) | ORAL | 0 refills | Status: DC

## 2021-09-19 NOTE — Progress Notes (Signed)
Tamiflu for the flu.  Five day course.

## 2021-09-24 ENCOUNTER — Other Ambulatory Visit (HOSPITAL_COMMUNITY): Payer: Self-pay

## 2021-10-31 ENCOUNTER — Other Ambulatory Visit (HOSPITAL_COMMUNITY): Payer: Self-pay

## 2021-12-10 ENCOUNTER — Telehealth: Payer: Self-pay

## 2021-12-10 ENCOUNTER — Telehealth: Payer: Self-pay | Admitting: *Deleted

## 2021-12-10 NOTE — Telephone Encounter (Signed)
See other message

## 2021-12-10 NOTE — Telephone Encounter (Signed)
Patietn is interested in trying trazodone for hot flashes instead of current treatment  Please advise

## 2021-12-10 NOTE — Telephone Encounter (Signed)
Caller name:[facility]   On DPR? 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

## 2021-12-11 ENCOUNTER — Other Ambulatory Visit: Payer: Self-pay | Admitting: Family Medicine

## 2021-12-11 MED ORDER — TRAZODONE HCL 50 MG PO TABS
50.0000 mg | ORAL_TABLET | Freq: Every evening | ORAL | 0 refills | Status: DC | PRN
  Filled 2021-12-11: qty 90, 45d supply, fill #0

## 2021-12-11 NOTE — Telephone Encounter (Signed)
Thersa Salt G, DO     I have never used trazodone for this. Trazodone is sedating and used for sleep. I would be okay using Trazodone if hot flashes are interfering with sleep. Otherwise I fear that this would contribute to drowsiness.

## 2021-12-11 NOTE — Telephone Encounter (Signed)
Pt contacted and states that she wakes up sweating. Pt states she cant remember the last time she got a good night sleep. Please advise. Thank you.

## 2021-12-12 ENCOUNTER — Other Ambulatory Visit (HOSPITAL_COMMUNITY): Payer: Self-pay

## 2021-12-12 NOTE — Telephone Encounter (Signed)
See other message (duplicate)

## 2021-12-12 NOTE — Telephone Encounter (Signed)
Pt returned call; left voicemail on nurse line. Pt contacted and verbalized understanding.

## 2021-12-12 NOTE — Telephone Encounter (Signed)
Left message to return call 

## 2022-01-12 ENCOUNTER — Other Ambulatory Visit: Payer: Self-pay | Admitting: Family Medicine

## 2022-01-15 ENCOUNTER — Other Ambulatory Visit (HOSPITAL_COMMUNITY): Payer: Self-pay

## 2022-01-15 MED ORDER — ESTRADIOL 0.025 MG/24HR TD PTTW
1.0000 | MEDICATED_PATCH | TRANSDERMAL | 0 refills | Status: DC
  Filled 2022-01-15: qty 8, 28d supply, fill #0

## 2022-01-16 ENCOUNTER — Ambulatory Visit (INDEPENDENT_AMBULATORY_CARE_PROVIDER_SITE_OTHER): Payer: 59 | Admitting: Family Medicine

## 2022-01-16 ENCOUNTER — Encounter: Payer: Self-pay | Admitting: Family Medicine

## 2022-01-16 VITALS — BP 128/78 | HR 68 | Temp 98.0°F | Wt 142.2 lb

## 2022-01-16 DIAGNOSIS — B9789 Other viral agents as the cause of diseases classified elsewhere: Secondary | ICD-10-CM | POA: Insufficient documentation

## 2022-01-16 DIAGNOSIS — R0989 Other specified symptoms and signs involving the circulatory and respiratory systems: Secondary | ICD-10-CM | POA: Diagnosis not present

## 2022-01-16 DIAGNOSIS — J988 Other specified respiratory disorders: Secondary | ICD-10-CM | POA: Diagnosis not present

## 2022-01-16 MED ORDER — IPRATROPIUM BROMIDE 0.06 % NA SOLN: 2.0000 | Freq: Four times a day (QID) | NASAL | 0 refills | Status: DC | PRN

## 2022-01-16 MED ORDER — PROMETHAZINE-DM 6.25-15 MG/5ML PO SYRP: 5.0000 mL | ORAL_SOLUTION | Freq: Four times a day (QID) | ORAL | 0 refills | Status: DC | PRN

## 2022-01-16 NOTE — Assessment & Plan Note (Signed)
No evidence of bacterial infection on exam. Well appearing.  Awaiting COVID/Flu/RSV test results. No work until testing returns. Rest, fluids. Symptomatic treatment with Atrovent and Promethazine DM.

## 2022-01-16 NOTE — Progress Notes (Signed)
Subjective:  Patient ID: Natalie Hess, female    DOB: [birth year]  Age: 61 y.o. MRN: [id number]  CC: Chief Complaint  Patient presents with   Headache    Pt arrives with hoarseness, headache, no energy, chest congestion, weakness. Symptoms began on Monday.     HPI:  61 year old female presents for evaluation of the above.   Sick since Monday. Has been working despite being sick. Reports voice change, headache, sore throat, congestion, fatigue, cough. Has had recent sick contacts (grandchildren). Has also has nausea and diarrhea. No fever. No relieving factors. Has not tested for COVID. No other complaints at this time.  Patient Active Problem List   Diagnosis Date Noted   Viral respiratory infection 01/16/2022   Physical exam, routine 08/11/2021   S/P ACL repair right 12/25/17 05/01/2017   Vasomotor flushing 08/30/2014    Social Hx   Social History   Socioeconomic History   Marital status: Married    Spouse name: Not on file   Number of children: Not on file   Years of education: Not on file   Highest education level: Not on file  Occupational History   Not on file  Tobacco Use   Smoking status: Former    Types: Cigarettes   Smokeless tobacco: Never  Vaping Use   Vaping Use: Never used  Substance and Sexual Activity   Alcohol use: Yes    Alcohol/week: 0.0 standard drinks of alcohol    Comment: socially   Drug use: No   Sexual activity: Yes    Birth control/protection: Surgical  Other Topics Concern   Not on file  Social History Narrative   Not on file   Social Determinants of Health   Financial Resource Strain: Not on file  Food Insecurity: Not on file  Transportation Needs: Not on file  Physical Activity: Not on file  Stress: Not on file  Social Connections: Not on file    Review of Systems Per HPI  Objective:  BP 128/78   Pulse 68   Temp 98 F (36.7 C)   Wt 142 lb 3.2 oz (64.5 kg)   SpO2 99%   BMI 22.95 kg/m      01/16/2022   11:44 AM  08/09/2021    3:41 PM 08/09/2021    3:17 PM  BP/Weight  Systolic BP 284 132 440  Diastolic BP 78 80 91  Wt. (Lbs) 142.2  140.4  BMI 22.95 kg/m2  22.66 kg/m2    Physical Exam Vitals and nursing note reviewed.  Constitutional:      Appearance: She is well-developed.  HENT:     Head: Normocephalic and atraumatic.     Right Ear: Tympanic membrane normal.     Left Ear: Tympanic membrane normal.     Mouth/Throat:     Pharynx: Oropharynx is clear.  Eyes:     General:        Right eye: No discharge.        Left eye: No discharge.     Conjunctiva/sclera: Conjunctivae normal.  Cardiovascular:     Rate and Rhythm: Normal rate and regular rhythm.  Pulmonary:     Effort: Pulmonary effort is normal.     Breath sounds: Normal breath sounds. No wheezing or rales.  Neurological:     Mental Status: She is alert.     Lab Results  Component Value Date   WBC 4.3 08/09/2021   HGB 13.3 08/09/2021   HCT 39.5 08/09/2021   PLT  226 08/09/2021   GLUCOSE 82 08/09/2021   CHOL 199 08/09/2021   TRIG 51 08/09/2021   HDL 106 08/09/2021   LDLCALC 83 08/09/2021   ALT 14 08/09/2021   AST 21 08/09/2021   NA 138 08/09/2021   K 4.4 08/09/2021   CL 99 08/09/2021   CREATININE 0.75 08/09/2021   BUN 15 08/09/2021   CO2 26 08/09/2021   TSH 0.948 08/15/2017     Assessment & Plan:   Problem List Items Addressed This Visit       Respiratory   Viral respiratory infection - Primary    No evidence of bacterial infection on exam. Well appearing.  Awaiting COVID/Flu/RSV test results. No work until testing returns. Rest, fluids. Symptomatic treatment with Atrovent and Promethazine DM.      Relevant Orders   COVID-19, Flu A+B and RSV    Meds ordered this encounter  Medications   ipratropium (ATROVENT) 0.06 % nasal spray    Sig: Place 2 sprays into both nostrils 4 (four) times daily as needed for rhinitis.    Dispense:  15 mL    Refill:  0   promethazine-dextromethorphan (PROMETHAZINE-DM)  6.25-15 MG/5ML syrup    Sig: Take 5 mLs by mouth 4 (four) times daily as needed for cough.    Dispense:  118 mL    Refill:  0    Follow-up:  Return if symptoms worsen or fail to improve.  Clifford

## 2022-01-16 NOTE — Patient Instructions (Addendum)
Rest. Lots of fluids.  Medications as prescribed.  Call [facility]. Stay at home.  We will call with the results.  Take care  Dr. Lacinda Axon

## 2022-01-17 ENCOUNTER — Other Ambulatory Visit (HOSPITAL_COMMUNITY): Payer: Self-pay

## 2022-01-17 ENCOUNTER — Encounter (HOSPITAL_COMMUNITY): Payer: Self-pay

## 2022-01-18 LAB — SPECIMEN STATUS REPORT

## 2022-01-18 LAB — COVID-19, FLU A+B AND RSV
Influenza A, NAA: NOT DETECTED
Influenza B, NAA: NOT DETECTED
RSV, NAA: NOT DETECTED
SARS-CoV-2, NAA: NOT DETECTED

## 2022-02-14 ENCOUNTER — Telehealth: Payer: Self-pay | Admitting: Family Medicine

## 2022-02-14 DIAGNOSIS — J370 Chronic laryngitis: Secondary | ICD-10-CM

## 2022-02-14 NOTE — Addendum Note (Signed)
Addended by: Vicente Males on: 02/14/2022 02:33 PM   Modules accepted: Orders

## 2022-02-14 NOTE — Telephone Encounter (Signed)
Referral placed and pt is aware. Pt verbalized understanding

## 2022-02-14 NOTE — Telephone Encounter (Signed)
Pt contacted office and states that she is still not getting better. Pt still having laryngitis. Pt works in the [place] [employer] and the doctors there believe she needs to see an ENT for a laryngoscopy. Pt states she is still having sinus issues and wants to know if provider has any other suggestions. Please advise. Thank you.

## 2022-03-04 DIAGNOSIS — R490 Dysphonia: Secondary | ICD-10-CM | POA: Diagnosis not present

## 2022-03-04 DIAGNOSIS — R498 Other voice and resonance disorders: Secondary | ICD-10-CM | POA: Diagnosis not present

## 2022-03-16 ENCOUNTER — Other Ambulatory Visit: Payer: Self-pay | Admitting: Family Medicine

## 2022-03-19 ENCOUNTER — Other Ambulatory Visit: Payer: Self-pay

## 2022-03-19 ENCOUNTER — Other Ambulatory Visit (HOSPITAL_COMMUNITY): Payer: Self-pay

## 2022-03-19 MED ORDER — ESTRADIOL 0.025 MG/24HR TD PTTW
1.0000 | MEDICATED_PATCH | TRANSDERMAL | 1 refills | Status: DC
  Filled 2022-03-19: qty 8, 28d supply, fill #0
  Filled 2022-05-22: qty 8, 28d supply, fill #1

## 2022-04-15 ENCOUNTER — Telehealth: Payer: Self-pay | Admitting: *Deleted

## 2022-04-15 NOTE — Telephone Encounter (Signed)
Left message to return call 

## 2022-04-15 NOTE — Telephone Encounter (Signed)
Natalie Hess G, DO     Needs to be seen. She was seen in November and by ENT in December.

## 2022-04-15 NOTE — Telephone Encounter (Signed)
Patient called and stated she was seen a few weeks ago with sickness and still has hoarseness, cough and congestion- thick and green and is requesting an antibiotic be sent to [facility] in [place] to knock it out

## 2022-04-16 NOTE — Telephone Encounter (Signed)
Pt called in to see if anything had been sent in. PCP recommends pt to be see. Appt scheduled for tomorrow at 60 with PCP.

## 2022-04-17 ENCOUNTER — Other Ambulatory Visit: Payer: Self-pay

## 2022-04-17 ENCOUNTER — Other Ambulatory Visit (HOSPITAL_COMMUNITY): Payer: Self-pay

## 2022-04-17 ENCOUNTER — Ambulatory Visit (INDEPENDENT_AMBULATORY_CARE_PROVIDER_SITE_OTHER): Payer: 59 | Admitting: Family Medicine

## 2022-04-17 ENCOUNTER — Encounter: Payer: Self-pay | Admitting: Family Medicine

## 2022-04-17 VITALS — BP 146/82 | HR 82 | Temp 98.5°F | Ht 66.0 in | Wt 140.0 lb

## 2022-04-17 DIAGNOSIS — J988 Other specified respiratory disorders: Secondary | ICD-10-CM | POA: Insufficient documentation

## 2022-04-17 MED ORDER — AMOXICILLIN-POT CLAVULANATE 875-125 MG PO TABS: 1.0000 | ORAL_TABLET | Freq: Two times a day (BID) | ORAL | 0 refills | Status: DC

## 2022-04-17 MED ORDER — AMOXICILLIN-POT CLAVULANATE 875-125 MG PO TABS
1.0000 | ORAL_TABLET | Freq: Two times a day (BID) | ORAL | 0 refills | Status: DC
  Filled 2022-04-17: qty 20, 10d supply, fill #0

## 2022-04-17 NOTE — Assessment & Plan Note (Signed)
Given persistent symptoms and lack of improvement, treating with Augmentin.  Labs as well.

## 2022-04-17 NOTE — Progress Notes (Signed)
Subjective:  Patient ID: Natalie Hess, female    DOB: Aug 27, 1960  Age: 62 y.o. MRN: [id number]  CC: Chief Complaint  Patient presents with   chest congestion    Laryngitis 2 weeks , low grade one day initial week , slight sore throat last couple days,had productive cough and drained nasal passages same occurred in November 2023 had endoscopy     HPI:  62 year old female presents for evaluation of the above.  Patient reports ongoing symptoms for the past 2 weeks.  She has had ongoing issues with laryngitis.  Has seen ear nose and throat for.  Ear nose and throat did laryngoscopy which was normal.  Recommended speech referral at [facility].  Patient has not proceeded with this.  She reports ongoing laryngitis, sore throat, productive cough, congestion.  She states that she initially had low-grade fever.  No relieving factors.  Patient Active Problem List   Diagnosis Date Noted   Respiratory infection 04/17/2022   Physical exam, routine 08/11/2021   S/P ACL repair right 12/25/17 05/01/2017    Social Hx   Social History   Socioeconomic History   Marital status: Married    Spouse name: Not on file   Number of children: Not on file   Years of education: Not on file   Highest education level: Not on file  Occupational History   Not on file  Tobacco Use   Smoking status: Former    Types: Cigarettes   Smokeless tobacco: Never  Vaping Use   Vaping Use: Never used  Substance and Sexual Activity   Alcohol use: Yes    Alcohol/week: 0.0 standard drinks of alcohol    Comment: socially   Drug use: No   Sexual activity: Yes    Birth control/protection: Surgical  Other Topics Concern   Not on file  Social History Narrative   Not on file   Social Determinants of Health   Financial Resource Strain: Not on file  Food Insecurity: Not on file  Transportation Needs: Not on file  Physical Activity: Not on file  Stress: Not on file  Social Connections: Not on file    Review of  Systems Per HPI  Objective:  BP (!) 146/82   Pulse 82   Temp 98.5 F (36.9 C)   Ht '5\' 6"'$  (1.676 m)   Wt 140 lb (63.5 kg)   SpO2 98%   BMI 22.60 kg/m      04/17/2022   10:52 AM 01/16/2022   11:44 AM 08/09/2021    3:41 PM  BP/Weight  Systolic BP 269 485 462  Diastolic BP 82 78 80  Wt. (Lbs) 140 142.2   BMI 22.6 kg/m2 22.95 kg/m2     Physical Exam Vitals and nursing note reviewed.  Constitutional:      General: She is not in acute distress.    Appearance: Normal appearance.  HENT:     Head: Normocephalic and atraumatic.     Right Ear: Tympanic membrane normal.     Left Ear: Tympanic membrane normal.     Mouth/Throat:     Pharynx: Oropharynx is clear.  Cardiovascular:     Rate and Rhythm: Normal rate and regular rhythm.  Pulmonary:     Effort: Pulmonary effort is normal.     Breath sounds: Normal breath sounds. No wheezing or rales.  Neurological:     Mental Status: She is alert.     Lab Results  Component Value Date   WBC 4.3 08/09/2021  HGB 13.3 08/09/2021   HCT 39.5 08/09/2021   PLT 226 08/09/2021   GLUCOSE 82 08/09/2021   CHOL 199 08/09/2021   TRIG 51 08/09/2021   HDL 106 08/09/2021   LDLCALC 83 08/09/2021   ALT 14 08/09/2021   AST 21 08/09/2021   NA 138 08/09/2021   K 4.4 08/09/2021   CL 99 08/09/2021   CREATININE 0.75 08/09/2021   BUN 15 08/09/2021   CO2 26 08/09/2021   TSH 0.948 08/15/2017     Assessment & Plan:   Problem List Items Addressed This Visit       Respiratory   Respiratory infection - Primary    Given persistent symptoms and lack of improvement, treating with Augmentin.  Labs as well.      Relevant Orders   CMP14+EGFR   CBC with Differential    Meds ordered this encounter  Medications   amoxicillin-clavulanate (AUGMENTIN) 875-125 MG tablet    Sig: Take 1 tablet by mouth 2 (two) times daily.    Dispense:  20 tablet    Refill:  [place]

## 2022-04-17 NOTE — Patient Instructions (Signed)
Antibiotic as prescribed.  Labs when you can.  If symptoms continue to persist, please let us know.  Take care  Dr. Lacinda Axon

## 2022-05-16 ENCOUNTER — Encounter: Payer: Self-pay | Admitting: Radiology

## 2022-05-22 ENCOUNTER — Other Ambulatory Visit (HOSPITAL_COMMUNITY): Payer: Self-pay

## 2022-05-23 DIAGNOSIS — M13841 Other specified arthritis, right hand: Secondary | ICD-10-CM | POA: Diagnosis not present

## 2022-05-23 DIAGNOSIS — M13842 Other specified arthritis, left hand: Secondary | ICD-10-CM | POA: Diagnosis not present

## 2022-06-10 ENCOUNTER — Encounter (HOSPITAL_COMMUNITY): Payer: Self-pay | Admitting: Orthopedic Surgery

## 2022-06-10 ENCOUNTER — Other Ambulatory Visit: Payer: Self-pay

## 2022-06-10 NOTE — Progress Notes (Signed)
PCP -Coral Spikes, DO  Anesthesia review: N  Patient verbally denies any shortness of breath, fever, cough and chest pain during phone call   -------------  SDW INSTRUCTIONS given:  Your procedure is scheduled on 06/11/22.  Report to [facility] Main Entrance "A" at 1220 P.M., and check in at the Admitting office.  Call this number if you have problems the morning of surgery:  [phone]   Remember:  Do not eat after midnight the night before your surgery  You may drink clear liquids until 1145 the afternoon of your surgery.   Clear liquids allowed are: Water, Non-Citrus Juices (without pulp), Carbonated Beverages, Clear Tea, Black Coffee Only, and Gatorade    Take these medicines the morning of surgery with A SIP OF WATER  N/A  As of today, STOP taking any Aspirin (unless otherwise instructed by your surgeon) Aleve, Naproxen, Ibuprofen, Motrin, Advil, Goody's, BC's, all herbal medications, fish oil, and all vitamins.                      Do not wear jewelry, make up, or nail polish            Do not wear lotions, powders, perfumes/colognes, or deodorant.            Do not shave 48 hours prior to surgery.  Men may shave face and neck.            Do not bring valuables to the hospital.            [facility] is not responsible for any belongings or valuables.  Do NOT Smoke (Tobacco/Vaping) 24 hours prior to your procedure If you use a CPAP at night, you may bring all equipment for your overnight stay.   Contacts, glasses, dentures or bridgework may not be worn into surgery.      For patients admitted to the hospital, discharge time will be determined by your treatment team.   Patients discharged the day of surgery will not be allowed to drive home, and someone needs to stay with them for 24 hours.    Special instructions:   [facility]- Preparing For Surgery  Before surgery, you can play an important role. Because skin is not sterile, your skin needs to be as free of  germs as possible. You can reduce the number of germs on your skin by washing with CHG (chlorahexidine gluconate) Soap before surgery.  CHG is an antiseptic cleaner which kills germs and bonds with the skin to continue killing germs even after washing.    Oral Hygiene is also important to reduce your risk of infection.  Remember - BRUSH YOUR TEETH THE MORNING OF SURGERY WITH YOUR REGULAR TOOTHPASTE  Please do not use if you have an allergy to CHG or antibacterial soaps. If your skin becomes reddened/irritated stop using the CHG.  Do not shave (including legs and underarms) for at least 48 hours prior to first CHG shower. It is OK to shave your face.  Please follow these instructions carefully.   Shower the NIGHT BEFORE SURGERY and the MORNING OF SURGERY with DIAL Soap.   Pat yourself dry with a CLEAN TOWEL.  Wear CLEAN PAJAMAS to bed the night before surgery  Place CLEAN SHEETS on your bed the night of your first shower and DO NOT SLEEP WITH PETS.   Day of Surgery: Please shower morning of surgery  Wear Clean/Comfortable clothing the morning of surgery Do not apply any deodorants/lotions.  Remember to brush your teeth WITH YOUR REGULAR TOOTHPASTE.   Questions were answered. Patient verbalized understanding of instructions.

## 2022-06-11 ENCOUNTER — Ambulatory Visit (HOSPITAL_COMMUNITY): Payer: 59 | Admitting: Certified Registered Nurse Anesthetist

## 2022-06-11 ENCOUNTER — Other Ambulatory Visit: Payer: Self-pay

## 2022-06-11 ENCOUNTER — Other Ambulatory Visit (HOSPITAL_COMMUNITY): Payer: Self-pay

## 2022-06-11 ENCOUNTER — Encounter (HOSPITAL_COMMUNITY)
Admission: RE | Disposition: A | Discharge status: Home / Self Care | Payer: Self-pay | Source: Ambulatory Visit | Attending: Orthopedic Surgery

## 2022-06-11 ENCOUNTER — Ambulatory Visit (HOSPITAL_COMMUNITY): Payer: 59

## 2022-06-11 ENCOUNTER — Encounter (HOSPITAL_COMMUNITY): Payer: Self-pay | Admitting: Orthopedic Surgery

## 2022-06-11 ENCOUNTER — Ambulatory Visit (HOSPITAL_COMMUNITY)
Admission: RE | Admit: 2022-06-11 | Discharge: 2022-06-11 | Disposition: A | Discharge status: Home / Self Care | Payer: 59 | Source: Ambulatory Visit | Attending: Orthopedic Surgery | Admitting: Orthopedic Surgery

## 2022-06-11 DIAGNOSIS — M1811 Unilateral primary osteoarthritis of first carpometacarpal joint, right hand: Secondary | ICD-10-CM

## 2022-06-11 DIAGNOSIS — Z87891 Personal history of nicotine dependence: Secondary | ICD-10-CM

## 2022-06-11 HISTORY — PX: CARPOMETACARPEL SUSPENSION PLASTY: SHX5005

## 2022-06-11 LAB — CBC
HCT: 49.3 % — ABNORMAL HIGH (ref 36.0–46.0)
Hemoglobin: 15.9 g/dL — ABNORMAL HIGH (ref 12.0–15.0)
MCH: 30.9 pg (ref 26.0–34.0)
MCHC: 32.3 g/dL (ref 30.0–36.0)
MCV: 95.9 fL (ref 80.0–100.0)
Platelets: 184 10*3/uL (ref 150–400)
RBC: 5.14 MIL/uL — ABNORMAL HIGH (ref 3.87–5.11)
RDW: 11.5 % (ref 11.5–15.5)
WBC: 4.8 10*3/uL (ref 4.0–10.5)
nRBC: 0 % (ref 0.0–0.2)

## 2022-06-11 SURGERY — CARPOMETACARPEL (CMC) SUSPENSION PLASTY
Anesthesia: Monitor Anesthesia Care | Laterality: Right

## 2022-06-11 MED ORDER — OXYCODONE HCL 5 MG PO TABS
5.0000 mg | ORAL_TABLET | ORAL | 0 refills | Status: DC
  Filled 2022-06-11: qty 42, 7d supply, fill #0

## 2022-06-11 MED ORDER — MIDAZOLAM HCL 2 MG/2ML IJ SOLN
INTRAMUSCULAR | Status: AC
  Administered 2022-06-11: 2 mg via INTRAVENOUS
  Filled 2022-06-11: qty 2

## 2022-06-11 MED ORDER — FENTANYL CITRATE (PF) 100 MCG/2ML IJ SOLN
INTRAMUSCULAR | Status: AC
  Filled 2022-06-11: qty 2

## 2022-06-11 MED ORDER — OXYCODONE HCL 5 MG PO TABS: 5.0000 mg | ORAL_TABLET | Freq: Once | ORAL | Status: DC | PRN

## 2022-06-11 MED ORDER — METHOCARBAMOL 500 MG PO TABS
500.0000 mg | ORAL_TABLET | Freq: Four times a day (QID) | ORAL | 0 refills | Status: DC
  Filled 2022-06-11: qty 40, 10d supply, fill #0

## 2022-06-11 MED ORDER — FENTANYL CITRATE (PF) 100 MCG/2ML IJ SOLN: 25.0000 ug | INTRAMUSCULAR | Status: DC | PRN

## 2022-06-11 MED ORDER — DEXAMETHASONE SODIUM PHOSPHATE 10 MG/ML IJ SOLN
INTRAMUSCULAR | Status: DC | PRN
  Administered 2022-06-11: 10 mg via INTRAVENOUS

## 2022-06-11 MED ORDER — ONDANSETRON HCL 4 MG/2ML IJ SOLN
INTRAMUSCULAR | Status: DC | PRN
  Administered 2022-06-11: 4 mg via INTRAVENOUS

## 2022-06-11 MED ORDER — CEPHALEXIN 500 MG PO CAPS
500.0000 mg | ORAL_CAPSULE | Freq: Four times a day (QID) | ORAL | 0 refills | Status: DC
  Filled 2022-06-11: qty 28, 7d supply, fill #0

## 2022-06-11 MED ORDER — ONDANSETRON HCL 4 MG/2ML IJ SOLN
INTRAMUSCULAR | Status: AC
  Filled 2022-06-11: qty 2

## 2022-06-11 MED ORDER — ACETAMINOPHEN 10 MG/ML IV SOLN: 1000.0000 mg | Freq: Once | INTRAVENOUS | Status: DC | PRN

## 2022-06-11 MED ORDER — PROPOFOL 10 MG/ML IV BOLUS
INTRAVENOUS | Status: DC | PRN
  Administered 2022-06-11: 30 mg via INTRAVENOUS

## 2022-06-11 MED ORDER — CHLORHEXIDINE GLUCONATE 0.12 % MT SOLN
15.0000 mL | Freq: Once | OROMUCOSAL | Status: AC
  Administered 2022-06-11: 15 mL via OROMUCOSAL
  Filled 2022-06-11: qty 15

## 2022-06-11 MED ORDER — OXYCODONE HCL 5 MG/5ML PO SOLN: 5.0000 mg | Freq: Once | ORAL | Status: DC | PRN

## 2022-06-11 MED ORDER — ONDANSETRON 8 MG PO TBDP
8.0000 mg | ORAL_TABLET | Freq: Three times a day (TID) | ORAL | 0 refills | Status: DC
  Filled 2022-06-11: qty 15, 5d supply, fill #0

## 2022-06-11 MED ORDER — ROPIVACAINE HCL 5 MG/ML IJ SOLN
INTRAMUSCULAR | Status: DC | PRN
  Administered 2022-06-11: 25 mL via PERINEURAL

## 2022-06-11 MED ORDER — CEFAZOLIN SODIUM-DEXTROSE 2-4 GM/100ML-% IV SOLN
2.0000 g | INTRAVENOUS | Status: AC
  Administered 2022-06-11: 2 g via INTRAVENOUS
  Filled 2022-06-11: qty 100

## 2022-06-11 MED ORDER — PROMETHAZINE HCL 25 MG/ML IJ SOLN: 6.2500 mg | INTRAMUSCULAR | Status: DC | PRN

## 2022-06-11 MED ORDER — 0.9 % SODIUM CHLORIDE (POUR BTL) OPTIME
TOPICAL | Status: DC | PRN
  Administered 2022-06-11: 1000 mL

## 2022-06-11 MED ORDER — ORAL CARE MOUTH RINSE: 15.0000 mL | Freq: Once | OROMUCOSAL | Status: AC

## 2022-06-11 MED ORDER — PROPOFOL 500 MG/50ML IV EMUL
INTRAVENOUS | Status: DC | PRN
  Administered 2022-06-11: 100 ug/kg/min via INTRAVENOUS

## 2022-06-11 MED ORDER — MIDAZOLAM HCL 2 MG/2ML IJ SOLN: 2.0000 mg | Freq: Once | INTRAMUSCULAR | Status: AC

## 2022-06-11 MED ORDER — LACTATED RINGERS IV SOLN: INTRAVENOUS | Status: DC

## 2022-06-11 SURGICAL SUPPLY — 61 items
ANCHOR FIBERLOCK SUSPENSION (Anchor) IMPLANT
BAG COUNTER SPONGE SURGICOUNT (BAG) ×2 IMPLANT
BAG SPNG CNTER NS LX DISP (BAG) ×1
BNDG CMPR 5X3 KNIT ELC UNQ LF (GAUZE/BANDAGES/DRESSINGS)
BNDG CMPR 9X4 STRL LF SNTH (GAUZE/BANDAGES/DRESSINGS)
BNDG ELASTIC 3INX 5YD STR LF (GAUZE/BANDAGES/DRESSINGS) ×2 IMPLANT
BNDG ELASTIC 4X5.8 VLCR STR LF (GAUZE/BANDAGES/DRESSINGS) IMPLANT
BNDG ESMARK 4X9 LF (GAUZE/BANDAGES/DRESSINGS) ×2 IMPLANT
BNDG GAUZE DERMACEA FLUFF 4 (GAUZE/BANDAGES/DRESSINGS) ×6 IMPLANT
BNDG GZE DERMACEA 4 6PLY (GAUZE/BANDAGES/DRESSINGS) ×1
BRUSH SCRUB EZ PLAIN DRY (MISCELLANEOUS) IMPLANT
CAP PIN ORTHO PINK (CAP) IMPLANT
CORD BIPOLAR FORCEPS 12FT (ELECTRODE) ×2 IMPLANT
COVER SURGICAL LIGHT HANDLE (MISCELLANEOUS) ×2 IMPLANT
CUFF TOURN SGL QUICK 18X4 (TOURNIQUET CUFF) ×2 IMPLANT
CUFF TOURN SGL QUICK 24 (TOURNIQUET CUFF)
CUFF TRNQT CYL 24X4X16.5-23 (TOURNIQUET CUFF) IMPLANT
DRAPE OEC MINIVIEW 54X84 (DRAPES) IMPLANT
DRSG ADAPTIC 3X8 NADH LF (GAUZE/BANDAGES/DRESSINGS) IMPLANT
DRSG EMULSION OIL 3X3 NADH (GAUZE/BANDAGES/DRESSINGS) ×2 IMPLANT
FIBERLOOP 2 0 (SUTURE) IMPLANT
GAUZE SPONGE 4X4 12PLY STRL (GAUZE/BANDAGES/DRESSINGS) ×2 IMPLANT
GAUZE XEROFORM 5X9 LF (GAUZE/BANDAGES/DRESSINGS) IMPLANT
GLOVE BIOGEL M 8.0 STRL (GLOVE) ×2 IMPLANT
GLOVE BIOGEL PI IND STRL 7.0 (GLOVE) IMPLANT
GLOVE SS BIOGEL STRL SZ 8 (GLOVE) ×4 IMPLANT
GOWN STRL REUS W/ TWL LRG LVL3 (GOWN DISPOSABLE) ×6 IMPLANT
GOWN STRL REUS W/ TWL XL LVL3 (GOWN DISPOSABLE) ×4 IMPLANT
GOWN STRL REUS W/TWL LRG LVL3 (GOWN DISPOSABLE) ×1
GOWN STRL REUS W/TWL XL LVL3 (GOWN DISPOSABLE) ×1
KIT ASCP FXDISP 3X8XBTNDS (KITS) IMPLANT
KIT BASIN OR (CUSTOM PROCEDURE TRAY) ×2 IMPLANT
KIT BIO-TENODESIS 3X8 DISP (KITS) ×1
KIT TURNOVER KIT B (KITS) ×2 IMPLANT
NDL HYPO 25GX1X1/2 BEV (NEEDLE) ×2 IMPLANT
NEEDLE HYPO 25GX1X1/2 BEV (NEEDLE) IMPLANT
NS IRRIG 1000ML POUR BTL (IV SOLUTION) ×2 IMPLANT
PACK ORTHO EXTREMITY (CUSTOM PROCEDURE TRAY) ×2 IMPLANT
PAD ARMBOARD 7.5X6 YLW CONV (MISCELLANEOUS) ×4 IMPLANT
PAD CAST 3X4 CTTN HI CHSV (CAST SUPPLIES) ×2 IMPLANT
PAD CAST 4YDX4 CTTN HI CHSV (CAST SUPPLIES) IMPLANT
PADDING CAST COTTON 3X4 STRL (CAST SUPPLIES)
PADDING CAST COTTON 4X4 STRL (CAST SUPPLIES) ×3
SCREW 4X10 W/DISP DRIVER (Screw) IMPLANT
SLING ARM FOAM STRAP LRG (SOFTGOODS) IMPLANT
SOL PREP POV-IOD 4OZ 10% (MISCELLANEOUS) ×4 IMPLANT
SOL SCRUB PVP POV-IOD 4OZ 7.5% (MISCELLANEOUS) ×1
SOLUTION SCRB POV-IOD 4OZ 7.5% (MISCELLANEOUS) IMPLANT
STOCKINETTE 4X48 STRL (DRAPES) IMPLANT
SUCTION FRAZIER HANDLE 10FR (MISCELLANEOUS)
SUCTION TUBE FRAZIER 10FR DISP (MISCELLANEOUS) IMPLANT
SUT FIBERWIRE 3-0 18 TAPR NDL (SUTURE) ×1
SUT PROLENE 4 0 PS 2 18 (SUTURE) ×2 IMPLANT
SUT VIC AB 3-0 FS2 27 (SUTURE) IMPLANT
SUTURE FIBERWR 3-0 18 TAPR NDL (SUTURE) IMPLANT
SYR CONTROL 10ML LL (SYRINGE) ×2 IMPLANT
TOWEL GREEN STERILE (TOWEL DISPOSABLE) ×2 IMPLANT
TOWEL GREEN STERILE FF (TOWEL DISPOSABLE) ×2 IMPLANT
TUBE CONNECTING 12X1/4 (SUCTIONS) IMPLANT
UNDERPAD 30X36 HEAVY ABSORB (UNDERPADS AND DIAPERS) ×2 IMPLANT
WATER STERILE IRR 1000ML POUR (IV SOLUTION) ×2 IMPLANT

## 2022-06-11 NOTE — Anesthesia Procedure Notes (Signed)
Procedure Name: MAC Date/Time: 06/11/2022 3:59 PM  Performed by: Maude Leriche, CRNAPre-anesthesia Checklist: Patient identified, Emergency Drugs available, Suction available, Patient being monitored and Timeout performed Patient Re-evaluated:Patient Re-evaluated prior to induction Oxygen Delivery Method: Nasal cannula Placement Confirmation: positive ETCO2 Dental Injury: Teeth and Oropharynx as per pre-operative assessment

## 2022-06-11 NOTE — Anesthesia Postprocedure Evaluation (Signed)
Anesthesia Post Note  Patient: Natalie Hess  Procedure(s) Performed: Right thumb carpometacarpal arthroplasty with double tendon transfer and fiber lock suspension and repair as necessary. (Right)     Patient location during evaluation: PACU Anesthesia Type: Regional Level of consciousness: awake and alert Pain management: pain level controlled Vital Signs Assessment: post-procedure vital signs reviewed and stable Respiratory status: spontaneous breathing Cardiovascular status: stable Anesthetic complications: no   No notable events documented.  Last Vitals:  Vitals:   06/11/22 1720 06/11/22 1735  BP: 139/89 (!) 159/96  Pulse: 77 70  Resp: 16 15  Temp: 36.4 C 36.4 C  SpO2: 98% 99%    Last Pain:  Vitals:   06/11/22 1720  PainSc: 0-No pain                 Nolon Nations

## 2022-06-11 NOTE — Anesthesia Preprocedure Evaluation (Addendum)
Anesthesia Evaluation  Patient identified by MRN, date of birth, ID band Patient awake    Reviewed: Allergy & Precautions, NPO status , Patient's Chart, lab work & pertinent test results  History of Anesthesia Complications (+) PONV and history of anesthetic complications  Airway Mallampati: II  TM Distance: >3 FB Neck ROM: Full    Dental  (+) Dental Advisory Given   Pulmonary neg shortness of breath, neg sleep apnea, neg COPD, neg recent URI, former smoker   Pulmonary exam normal breath sounds clear to auscultation       Cardiovascular negative cardio ROS  Rhythm:Regular Rate:Normal     Neuro/Psych negative neurological ROS     GI/Hepatic negative GI ROS, Neg liver ROS,,,  Endo/Other  negative endocrine ROS    Renal/GU negative Renal ROS     Musculoskeletal  (+) Arthritis ,    Abdominal   Peds  Hematology negative hematology ROS (+)   Anesthesia Other Findings   Reproductive/Obstetrics                             Anesthesia Physical Anesthesia Plan  ASA: 2  Anesthesia Plan: MAC and Regional   Post-op Pain Management: Regional block*   Induction: Intravenous  PONV Risk Score and Plan: 3 and Ondansetron, Dexamethasone, Propofol infusion and Treatment may vary due to age or medical condition  Airway Management Planned: Natural Airway and Simple Face Mask  Additional Equipment:   Intra-op Plan:   Post-operative Plan:   Informed Consent: I have reviewed the patients History and Physical, chart, labs and discussed the procedure including the risks, benefits and alternatives for the proposed anesthesia with the patient or authorized representative who has indicated his/her understanding and acceptance.     Dental advisory given  Plan Discussed with: CRNA and Anesthesiologist  Anesthesia Plan Comments: (Discussed potential risks of nerve blocks including, but not limited to,  infection, bleeding, nerve damage, seizures, pneumothorax, respiratory depression, and potential failure of the block. Alternatives to nerve blocks discussed. All questions answered.  Discussed with patient risks of MAC including, but not limited to, minor pain or discomfort, hearing people in the room, and possible need for backup general anesthesia. Risks for general anesthesia also discussed including, but not limited to, sore throat, hoarse voice, chipped/damaged teeth, injury to vocal cords, nausea and vomiting, allergic reactions, lung infection, heart attack, stroke, and death. All questions answered. )        Anesthesia Quick Evaluation

## 2022-06-11 NOTE — Discharge Instructions (Signed)
Please make sure to elevate move and massage her fingers.  Please make sure to take a stool softener as the pain medicine will constipate you.  We recommend Peri-Colace.  We will call for your follow-up in 14 days.  If you have emergencies call Dr. Amedeo Plenty on his cell phone at [phone]  We recommend vitamin C 1000 mg a day.  We also recommend that you take Tylenol 650 mg and ibuprofen 400 mg every 6 hours for additional pain control.  You may take this in addition and with your narcotic pain medicine (oxycodone).  Keep bandage clean and dry.  Call for any problems.  No smoking.  Criteria for driving a car: you should be off your pain medicine for 7-8 hours, able to drive one handed(confident), thinking clearly and feeling able in your judgement to drive. Continue elevation as it will decrease swelling.  If instructed by MD move your fingers within the confines of the bandage/splint.  Use ice if instructed by your MD. Call immediately for any sudden loss of feeling in your hand/arm or change in functional abilities of the extremity.    We recommend that you to take vitamin C 1000 mg a day to promote healing. We also recommend that if you require  pain medicine that you take a stool softener to prevent constipation as most pain medicines will have constipation side effects. We recommend either Peri-Colace or Senokot and recommend that you also consider adding MiraLAX as well to prevent the constipation affects from pain medicine if you are required to use them. These medicines are over the counter and may be purchased at a local pharmacy. A cup of yogurt and a probiotic can also be helpful during the recovery process as the medicines can disrupt your intestinal environment.

## 2022-06-11 NOTE — Op Note (Signed)
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  Right thumb CMC arthroplasty/removal of the trapezium at the basilar thumb joint region. 2.  Right thumb abductor pollicis longus digastric portion tendon transfer to the APL and the FCR with multiple figure-of-eight throws (Weilby tendon transfer)/tendon transfer wrist/basilar region. 3.  Right thumb/basilar thumb joint/wrist Zancolli tendon transfer/1/3 proper portion APL tendon transfer to the first metacarpal FCR and back upon itself for suspension and tendon transfer purposes. 4.  Fiber lock suspension for second metacarpal Arthrex system right basilar thumb joint arthroplasty 5.  Flexor carpi radialis tenotomy at the proximal middle third of the forearm at the muscular tendinous junction 6.  5 view radiographic series right wrist and thumb performed examined and interpreted by myself 7.  Abductor pollicis longus tenodesis (shortening of her wrist extensor at the wrist forearm level to prevent dorsal lateral escape) right wrist/basilar thumb  Gus Littler MD Surgeon of record  Anesthesia Block with IV sedation  Estimated blood loss minimal  Complications none  Description of procedure patient was taken to the operative theater underwent a smooth induction of IV sedation she was prepped and draped with Hibiclens followed by Betadine scrub by myself timeout was observed antibiotics were given the operation commenced with tourniquet insufflation after the timeout.  Dorsal radial incision was made dissection was carried down to the EPB and APL tendon sheaths were decompressed followed by dissection of the radial artery.  Following this I entered the capsule and performed removal of the trapezium in piecemeal fashion taking care to protect the radial  artery and associated neurovascular structures as well as the superficial radial nerve.  I checked the scaphoid trapezoid articulation which looked very good.  The trapezium was totally excised FCR underwent tenolysis tendon synovectomy and x-rays were taken to verify correct position.  This completed the arthroplasty portion of the procedure.  Following this drill hole was made in the second metacarpal base from proximal to distal ulna.  Guide was placed over the wire this was checked on radiograph and the drill was placed on the guide followed by setting of the fiber lock on the distal ulnar aspect of the second metacarpal base.  These were then placed through a drill hole made dorsal to palmar about the first metacarpal which exited and articularly.  This fiber tapes were placed from volar to dorsal and held.  This was later secured with the Zancolli tendon transfer.  At this time I performed harvesting of the APL digastric portion and then APL one third proper portion to a small counterincision in the distal third of the forearm.  Tendons were harvested, fiber loops placed around the distal ends which were retrieved in the area of the main dissection and incision.  The counterincision at the distal third of the dorsal radial forearm was irrigated and closed with Prolene.  Following this the patient underwent APL one third proper portion tendon transfer to the first metacarpal around the FCR 3 times then back to itself and into the first metacarpal drill hole.  This was secured with a Bio-Tenodesis screw and multiple sutures of FiberWire.  The suture tapes from the fiber lock were held in appropriate tension and were also captured in the Bio-Tenodesis screw fixation.  This completed a double vested suspension with Zancolli tendon transfer.  Following  this a second tendon transfer of the digastric portion of the APL was placed around the FCR and back through the APL proper with multiple figure-of-eight  throws completing the Weilby tendon transfer.  This was the second tendon transfer with the patient tolerated well.  Following this I made a small counterincision at the proximal third of the forearm, dissection was carried down and the FCR underwent tenotomy at the musculo-tendinous junction.  This was a flexor carpi radialis tenotomy at the proximal middle third of the forearm to lessen deforming forces distally.  Following this the APL underwent a shortening at the main incision this was an APL tenodesis to prevent dorsal lateral escape.  This was a shortening of her wrist extensor at the wrist level.  At this time I deflated the tourniquet irrigated all wounds and closed these with Prolene after complex capsular closure with FiberWire about the main incision over the [facility] joint.  Thus the patient underwent [place] arthroplasty with double tendon transfer and APL tenodesis with fiber lock suspension and FCR tenotomy.  X-rays look excellent.  Patient has my cellular phone for any problems.  Discharge medicines have been thoroughly gone over with family.  Should any problems occur I will be immediately available.  Tasheika Kitzmiller MD

## 2022-06-11 NOTE — H&P (Signed)
  Patient has been seen and evaluated.  We are ready to proceed with surgical intervention.  She understands all risk and benefits and we will move forward accordingly.  We are planning surgery for your upper extremity. The risk and benefits of surgery to include risk of bleeding, infection, anesthesia,  damage to normal structures and failure of the surgery to accomplish its intended goals of relieving symptoms and restoring function have been discussed in detail. With this in mind we plan to proceed. I have specifically discussed with the patient the pre-and postoperative regime and the dos and don'ts and risk and benefits in great detail. Risk and benefits of surgery also include risk of dystrophy(CRPS), chronic nerve pain, failure of the healing process to go onto completion and other inherent risks of surgery The relavent the pathophysiology of the disease/injury process, as well as the alternatives for treatment and postoperative course of action has been discussed in great detail with the patient who desires to proceed.  We will do everything in our power to help you (the patient) restore function to the upper extremity. It is a pleasure to see this patient today.   Jakobie Henslee MD

## 2022-06-11 NOTE — H&P (Signed)
Natalie Hess is an 62 y.o. female.   Chief Complaint: The patient presents for right thumb reconstruction with arthroplasty HPI: The patient presents for right thumb carpometacarpal arthroplasty at the base of the thumb with suspension and double tendon transfer.  Patient presents for evaluation and treatment of the of their upper extremity predicament. The patient denies neck, back, chest or  abdominal pain. The patient notes that they have no lower extremity problems. The patients primary complaint is noted. We are planning surgical care pathway for the upper extremity.   Past Medical History:  Diagnosis Date  . Medical history non-contributory   . PONV (postoperative nausea and vomiting)    pt states the combination of zofran, decadron and scope patch worked well for her PONV history.    Past Surgical History:  Procedure Laterality Date  . ABDOMINAL HYSTERECTOMY    . ANTERIOR CRUCIATE LIGAMENT REPAIR Right 12/25/2017   Procedure: KNEE ARTHROSCOPY WITH ANTERIOR CRUCIATE LIGAMENT (ACL) REPAIR with Allograft Achilles,  medial and lateral meniscectomy;  Surgeon: Carole Civil, MD;  Location: [facility];  Service: Orthopedics;  Laterality: Right;  . CESAREAN SECTION     X 2  . COLONOSCOPY N/A 02/17/2015   Procedure: COLONOSCOPY;  Surgeon: Rogene Houston, MD;  Location: [facility];  Service: Endoscopy;  Laterality: N/A;  930  . KNEE ARTHROSCOPY WITH MEDIAL MENISECTOMY Right 04/24/2017   Procedure: KNEE ARTHROSCOPY WITH PARTIAL MEDIAL MENISECTOMY; ACL DEBRIEDMENT;  Surgeon: Carole Civil, MD;  Location: [facility];  Service: Orthopedics;  Laterality: Right;    Family History  Problem Relation Age of Onset  . Melanoma Mother        died from metastatic disease   Social History:  reports that she has quit smoking. Her smoking use included cigarettes. She has never used smokeless tobacco. She reports current alcohol use. She reports that she does not use drugs.  Allergies: No Known  Allergies  No medications prior to admission.    No results found for this or any previous visit (from the past 48 hour(s)). No results found.  Review of Systems  Eyes: Negative.   Respiratory: Negative.    Gastrointestinal: Negative.   Endocrine: Negative.   Genitourinary: Negative.   Musculoskeletal: Negative.    Height 5\' 6"  (1.676 m), weight 63.5 kg. 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  Patient presents for surgical reconstruction with double tendon transfer and arthroplasty.  She has no evidence of instability about the MCP joint or infection.  The patient and myself have discussed all issues plans and concerns.  The patient is alert and oriented in no acute distress. The patient complains of pain in the affected upper extremity.  The patient is noted to have a normal HEENT exam. Lung fields show equal chest expansion and no shortness of breath. Abdomen exam is nontender without distention. Lower extremity examination does not show any fracture dislocation or blood clot symptoms. Pelvis is stable and the neck and back are stable and nontender.  Assessment/Plan We are planning surgery for your upper extremity. The risk and benefits of surgery to include risk of bleeding, infection, anesthesia,  damage to normal structures and failure of the surgery to accomplish its intended goals of relieving symptoms and restoring function have been discussed in detail. With this in mind we plan to proceed. I have specifically discussed with the patient the pre-and postoperative regime and the dos and don'ts and risk and  benefits in great detail. Risk and benefits of surgery also include risk of dystrophy(CRPS), chronic nerve pain, failure of the healing process to go onto completion and other inherent risks of surgery The relavent the pathophysiology of the disease/injury process, as well as the alternatives for treatment and  postoperative course of action has been discussed in great detail with the patient who desires to proceed.  We will do everything in our power to help you (the patient) restore function to the upper extremity. It is a pleasure to see this patient today.   Once again will plan for surgical reconstruction of the right thumb with arthroplasty and a fiber lock suspension with repair is necessary.  I discussed with patient all issues risk and benefits and other aspects of the care plan.  With this in mind we will move forward accordingly.  [name], MD 06/11/2022, 5:51 AM

## 2022-06-11 NOTE — Anesthesia Procedure Notes (Addendum)
Anesthesia Regional Block: Supraclavicular block   Pre-Anesthetic Checklist: , timeout performed,  Correct Patient, Correct Site, Correct Laterality,  Correct Procedure, Correct Position, site marked,  Risks and benefits discussed,  Surgical consent,  Pre-op evaluation,  At surgeon's request and post-op pain management  Laterality: Right  Prep: chloraprep       Needles:  Injection technique: Single-shot  Needle Type: Echogenic Stimulator Needle     Needle Length: 9cm  Needle Gauge: 21     Additional Needles:   Procedures:,,,, ultrasound used (permanent image in chart),,    Narrative:  Start time: 06/11/2022 1:25 PM End time: 06/11/2022 1:29 PM Injection made incrementally with aspirations every 5 mL.  Performed by: Personally  Anesthesiologist: Nilda Simmer, MD  Additional Notes: Discussed risks and benefits of nerve block including, but not limited to, prolonged and/or permanent nerve injury involving sensory and/or motor function. Monitors were applied and a time-out was performed. The nerve and associated structures were visualized under ultrasound guidance. After negative aspiration, local anesthetic was slowly injected around the nerve. There was no evidence of high pressure during the procedure. There were no paresthesias. VSS remained stable and the patient tolerated the procedure well.

## 2022-06-11 NOTE — Transfer of Care (Signed)
Immediate Anesthesia Transfer of Care Note  Patient: Natalie Hess  Procedure(s) Performed: Right thumb carpometacarpal arthroplasty with double tendon transfer and fiber lock suspension and repair as necessary. (Right)  Patient Location: PACU  Anesthesia Type:MAC combined with regional for post-op pain  Level of Consciousness: awake, alert , and oriented  Airway & Oxygen Therapy: Patient Spontanous Breathing  Post-op Assessment: Report given to RN, Post -op Vital signs reviewed and stable, Patient moving all extremities, and Patient able to stick tongue midline  Post vital signs: Reviewed  Last Vitals:  Vitals Value Taken Time  BP 139/89 06/11/22 1720  Temp 97.6   Pulse 74 06/11/22 1722  Resp 16 06/11/22 1720  SpO2 99 % 06/11/22 1722  Vitals shown include unvalidated device data.  Last Pain:  Vitals:   06/11/22 1335  PainSc: 0-No pain         Complications: No notable events documented.

## 2022-06-12 NOTE — Addendum Note (Signed)
Addendum  created 06/12/22 1338 by Nolon Nations, MD   Intraprocedure Staff edited

## 2022-06-13 ENCOUNTER — Encounter (HOSPITAL_COMMUNITY): Payer: Self-pay | Admitting: Orthopedic Surgery

## 2022-06-26 DIAGNOSIS — M13841 Other specified arthritis, right hand: Secondary | ICD-10-CM | POA: Diagnosis not present

## 2022-06-26 DIAGNOSIS — Z4789 Encounter for other orthopedic aftercare: Secondary | ICD-10-CM | POA: Diagnosis not present

## 2022-06-27 ENCOUNTER — Ambulatory Visit (HOSPITAL_COMMUNITY): Payer: 59 | Admitting: Occupational Therapy

## 2022-07-11 DIAGNOSIS — M79644 Pain in right finger(s): Secondary | ICD-10-CM | POA: Diagnosis not present

## 2022-07-11 DIAGNOSIS — M25641 Stiffness of right hand, not elsewhere classified: Secondary | ICD-10-CM | POA: Diagnosis not present

## 2022-07-11 DIAGNOSIS — M13841 Other specified arthritis, right hand: Secondary | ICD-10-CM | POA: Diagnosis not present

## 2022-07-24 ENCOUNTER — Other Ambulatory Visit (HOSPITAL_COMMUNITY): Payer: Self-pay

## 2022-07-24 DIAGNOSIS — M545 Low back pain, unspecified: Secondary | ICD-10-CM | POA: Diagnosis not present

## 2022-07-24 MED ORDER — HYDROCODONE-ACETAMINOPHEN 5-325 MG PO TABS
1.0000 | ORAL_TABLET | Freq: Four times a day (QID) | ORAL | 0 refills | Status: DC
  Filled 2022-07-24: qty 20, 5d supply, fill #0

## 2022-07-24 MED ORDER — PREDNISONE 10 MG PO TABS
ORAL_TABLET | ORAL | 0 refills | Status: DC
  Filled 2022-07-24: qty 21, 12d supply, fill #0

## 2022-07-26 ENCOUNTER — Other Ambulatory Visit: Payer: Self-pay

## 2022-07-26 ENCOUNTER — Ambulatory Visit (HOSPITAL_COMMUNITY): Payer: 59 | Attending: Orthopedic Surgery | Admitting: Occupational Therapy

## 2022-07-26 ENCOUNTER — Encounter (HOSPITAL_COMMUNITY): Payer: Self-pay | Admitting: Occupational Therapy

## 2022-07-26 DIAGNOSIS — R29898 Other symptoms and signs involving the musculoskeletal system: Secondary | ICD-10-CM | POA: Insufficient documentation

## 2022-07-26 DIAGNOSIS — M25531 Pain in right wrist: Secondary | ICD-10-CM | POA: Diagnosis not present

## 2022-07-26 DIAGNOSIS — M25631 Stiffness of right wrist, not elsewhere classified: Secondary | ICD-10-CM | POA: Diagnosis not present

## 2022-07-26 NOTE — Therapy (Signed)
OUTPATIENT OCCUPATIONAL THERAPY ORTHO EVALUATION  Patient Name: Natalie Hess MRN: [id number] DOB:[birth year], 62 y.o., female Today's Date: 07/26/2022  PCP: Tommie Sams, DO REFERRING PROVIDER: Dominica Severin, MD  END OF SESSION:  OT End of Session - 07/26/22 1045     Visit Number 1    Number of Visits 9    Date for OT Re-Evaluation 08/30/22    Authorization Type [employer]    Progress Note Due on Visit 10    OT Start Time 1035    OT Stop Time 1115    OT Time Calculation (min) 40 min    Activity Tolerance Patient tolerated treatment well    Behavior During Therapy WFL for tasks assessed/performed             Past Medical History:  Diagnosis Date   Medical history non-contributory    PONV (postoperative nausea and vomiting)    pt states the combination of zofran, decadron and scope patch worked well for her PONV history.   Past Surgical History:  Procedure Laterality Date   ABDOMINAL HYSTERECTOMY     ANTERIOR CRUCIATE LIGAMENT REPAIR Right 12/25/2017   Procedure: KNEE ARTHROSCOPY WITH ANTERIOR CRUCIATE LIGAMENT (ACL) REPAIR with Allograft Achilles,  medial and lateral meniscectomy;  Surgeon: Vickki Hearing, MD;  Location: [facility];  Service: Orthopedics;  Laterality: Right;   CARPOMETACARPEL SUSPENSION PLASTY Right 06/11/2022   Procedure: Right thumb carpometacarpal arthroplasty with double tendon transfer and fiber lock suspension and repair as necessary.;  Surgeon: Dominica Severin, MD;  Location: [facility];  Service: Orthopedics;  Laterality: Right;  Block with IV Sedation   CESAREAN SECTION     X 2   COLONOSCOPY N/A 02/17/2015   Procedure: COLONOSCOPY;  Surgeon: Malissa Hippo, MD;  Location: [facility];  Service: Endoscopy;  Laterality: N/A;  930   KNEE ARTHROSCOPY WITH MEDIAL MENISECTOMY Right 04/24/2017   Procedure: KNEE ARTHROSCOPY WITH PARTIAL MEDIAL MENISECTOMY; ACL DEBRIEDMENT;  Surgeon: Vickki Hearing, MD;  Location: [facility];  Service: Orthopedics;  Laterality:  Right;   Patient Active Problem List   Diagnosis Date Noted   Respiratory infection 04/17/2022   Physical exam, routine 08/11/2021   S/P ACL repair right 12/25/17 05/01/2017    ONSET DATE: 06/11/22  REFERRING DIAG: R Carpometacarpal Suspension Plasty  THERAPY DIAG:  Pain in right wrist  Stiffness of right wrist, not elsewhere classified  Other symptoms and signs involving the musculoskeletal system  Rationale for Evaluation and Treatment: Rehabilitation  SUBJECTIVE:   SUBJECTIVE STATEMENT: "I've been moving my hand as much as I can" Pt accompanied by: self  PERTINENT HISTORY: No significant PMH, right thumb carpometacarpal arthroplasty at the base of the thumb with suspension and double tendon transfer on 06/11/22.  PRECAUTIONS: Other: Hand Protocol  WEIGHT BEARING RESTRICTIONS: Yes no more than a cell phone  PAIN:  Are you having pain? No  FALLS: Has patient fallen in last 6 months? Yes. Number of falls 1  LIVING ENVIRONMENT: Lives with: lives with their spouse Lives in: House/apartment  PLOF: Independent  PATIENT GOALS: To get my strength back  NEXT MD VISIT: 08/08/22  OBJECTIVE:   HAND DOMINANCE: Left  ADLs: Overall ADLs: Pt reports difficulty with manipulating small objects, unable to lift anything to complete cooking or cleaning tasks.   FUNCTIONAL OUTCOME MEASURES: FOTO: 37.12  UPPER EXTREMITY ROM:     Active ROM Right eval  Wrist flexion 48  Wrist extension 45  Wrist ulnar deviation 47  Wrist radial deviation  5  Wrist pronation WFL  Wrist supination WFL  (Blank rows = not tested)  Active ROM Right eval  Thumb MCP (0-60) 30  Thumb IP (0-80) 45  Thumb Opposition to Small Finger 2.5cm  Index MCP (0-90) 75  Index PIP (0-100) 75  Index DIP (0-70) 55   Long MCP (0-90)  85  Long PIP (0-100)  80  Long DIP (0-70) 50   Ring MCP (0-90)  75  Ring PIP (0-100)  85  Ring DIP (0-70)  65  Little MCP (0-90)  75  Little PIP (0-100)  70  Little  DIP (0-70) 70   (Blank rows = not tested)   UPPER EXTREMITY MMT:     MMT Right eval  Wrist flexion   Wrist extension   Wrist ulnar deviation   Wrist radial deviation   Wrist pronation   Wrist supination   (Blank rows = not tested)  HAND FUNCTION: ** Will assess on 1st treatment Grip strength: Right: - lbs; Left: - lbs, Lateral pinch: Right: - lbs, Left: - lbs, and 3 point pinch: Right: - lbs, Left: - lbs  COORDINATION: Will assess on 1st treatment 9 Hole Peg test: Right: -- sec; Left: - sec  SENSATION: WFL  EDEMA: Mild swelling noted in the wrist  OBSERVATIONS: mild to moderate fascial restrictions along extensor bundle and palmar aspect around thumb   TODAY'S TREATMENT:                                                                                                                              DATE: 07/26/22: Evaluation Only    PATIENT EDUCATION: Education details: Wrist and Digit ROM Person educated: Patient Education method: Explanation, Demonstration, and Handouts Education comprehension: verbalized understanding and returned demonstration  HOME EXERCISE PROGRAM: 5/10: Wrist and Digit ROM  GOALS: Goals reviewed with patient? Yes  SHORT TERM GOALS: Target date: 08/30/22  Pt will be provided with and educated on HEP to improve pain and mobility in R hand required for ADL completion.  Goal status: INITIAL  2.  Pt will decrease pain in R hand and shoulder to 2/10 or less in order to sleep for 3+ consecutive hours without waking due to pain. Goal status: INITIAL  3.  Pt will increase ROM in left digits by 20+ degrees to improve ability to form a full grasp required for doing her hair and makeup with dominant right hand. Goal status: INITIAL  4.  Pt will increase dominant right grip strength by 10# and pinch strength by 14# or greater to improve  Goal status: INITIAL  5.  Pt will decrease RUE fascial restrictions and swelling to minimal amounts or less to  improve mobility required typing and writing.  Goal status: INITIAL  6.   Pt will increase RUE strength to 5/5 or greater to improve ability to perform lifting tasks during housekeeping.  Goal status: INITIAL   ASSESSMENT:  CLINICAL IMPRESSION: Patient is a  62 y.o. female who was seen today for occupational therapy evaluation for right thumb carpometacarpal arthroplasty at the base of the thumb with suspension and double tendon transfer. She continues to have limited wrist and digit ROM with limited strength. At this time pt continues to wear radial gutter splint at all times except when completing exercises.   PERFORMANCE DEFICITS: in functional skills including ADLs, IADLs, sensation, edema, ROM, strength, pain, fascial restrictions, Fine motor control, Gross motor control, body mechanics, and UE functional use.  IMPAIRMENTS: are limiting patient from ADLs, IADLs, rest and sleep, work, leisure, and social participation.   COMORBIDITIES: has no other co-morbidities that affects occupational performance. Patient will benefit from skilled OT to address above impairments and improve overall function.  MODIFICATION OR ASSISTANCE TO COMPLETE EVALUATION: No modification of tasks or assist necessary to complete an evaluation.  OT OCCUPATIONAL PROFILE AND HISTORY: Problem focused assessment: Including review of records relating to presenting problem.  CLINICAL DECISION MAKING: LOW - limited treatment options, no task modification necessary  REHAB POTENTIAL: Good  EVALUATION COMPLEXITY: Low      PLAN:  OT FREQUENCY: 2x/week  OT DURATION: 4 weeks  PLANNED INTERVENTIONS: self care/ADL training, therapeutic exercise, therapeutic activity, manual therapy, scar mobilization, passive range of motion, functional mobility training, splinting, electrical stimulation, ultrasound, paraffin, moist heat, cryotherapy, patient/family education, coping strategies training, and DME and/or AE  instructions  RECOMMENDED OTHER SERVICES: N/A  CONSULTED AND AGREED WITH PLAN OF CARE: Patient  PLAN FOR NEXT SESSION: Manual Therapy, P/ROM, Gentle ROM, Gentle Stretching, Theraputty  Trish Mage, OTR/L [facility] [phone] Kennyth Arnold, OT 07/26/2022, 10:48 AM

## 2022-07-26 NOTE — Patient Instructions (Signed)
Complete each exercise 10-15X, 2-3X/day  1) Towel crunch Place a small towel on a firm table top. Flatten out the towel and then place your hand on one end of it.  Next, flex your fingers 2-5 (index finger through pinky finger) as you pull the towel towards your hand.    2) Digit composite flexion/adduction (make a fist) Hold your hand up as shown. Open and close your hand into a fist and repeat. If you cannot make a full fist, then make a partial fist.    3) Thumb/finger opposition Touch the tip of the thumb to each fingertip one by one. Extend fingers fully after they are touched.      4) Finger Taps Start with the hand flat and fingers slightly spread.  One at a time, starting with the thumb, lift each finger up separately.    5) PIP Joint Blocking Grasp the affected finger, bracing below the middle knuckle, and actively bend the finger as shown.    6) DIP Joint Blocking Grasp the affected finger, bracing below the last knuckle, and actively bend the finger at the last joint.     7) Digit Abduction/Adduction Hold hand palm down flat on table. Spread your fingers apart and back together.    AROM Exercises   1) Wrist Flexion  Start with wrist at edge of table, palm facing up. With wrist hanging slightly off table, curl wrist upward, and back down.      2) Wrist Extension  Start with wrist at edge of table, palm facing down. With wrist slightly off the edge of the table, curl wrist up and back down.      3) Radial Deviations  Start with forearm flat against a table, wrist hanging slightly off the edge, and palm facing the wall. Bending at the wrist only, and keeping palm facing the wall, bend wrist so fist is pointing towards the floor, back up to start position, and up towards the ceiling. Return to start.        4) WRIST PRONATION  Turn your forearm towards palm face down.  Keep your elbow bent and by the side of your  Body.      5) WRIST  SUPINATION  Turn your forearm towards palm face up.  Keep your elbow bent and by the side of your  Body.      *Complete exercises ______ times each, _______ times per day*  

## 2022-07-31 ENCOUNTER — Other Ambulatory Visit (HOSPITAL_COMMUNITY): Payer: Self-pay | Admitting: Otolaryngology

## 2022-07-31 ENCOUNTER — Other Ambulatory Visit (HOSPITAL_COMMUNITY): Payer: 59

## 2022-07-31 ENCOUNTER — Encounter (HOSPITAL_COMMUNITY): Payer: 59 | Admitting: Occupational Therapy

## 2022-07-31 DIAGNOSIS — M545 Low back pain, unspecified: Secondary | ICD-10-CM

## 2022-08-01 ENCOUNTER — Ambulatory Visit (HOSPITAL_COMMUNITY)
Admission: RE | Admit: 2022-08-01 | Discharge: 2022-08-01 | Disposition: A | Discharge status: Home / Self Care | Payer: 59 | Source: Ambulatory Visit | Attending: Otolaryngology | Admitting: Otolaryngology

## 2022-08-01 DIAGNOSIS — M545 Low back pain, unspecified: Secondary | ICD-10-CM | POA: Diagnosis not present

## 2022-08-06 ENCOUNTER — Encounter (HOSPITAL_COMMUNITY): Payer: Self-pay | Admitting: Occupational Therapy

## 2022-08-06 ENCOUNTER — Ambulatory Visit (HOSPITAL_COMMUNITY): Payer: 59 | Admitting: Occupational Therapy

## 2022-08-06 DIAGNOSIS — M25631 Stiffness of right wrist, not elsewhere classified: Secondary | ICD-10-CM | POA: Diagnosis not present

## 2022-08-06 DIAGNOSIS — R29898 Other symptoms and signs involving the musculoskeletal system: Secondary | ICD-10-CM

## 2022-08-06 DIAGNOSIS — M25531 Pain in right wrist: Secondary | ICD-10-CM

## 2022-08-06 NOTE — Therapy (Signed)
OUTPATIENT OCCUPATIONAL THERAPY ORTHO TREATMENT NOTE  Patient Name: Natalie Hess MRN: [id number] DOB:30-Mar-1960, 62 y.o., female Today's Date: 07/26/2022  PCP: Tommie Sams, DO REFERRING PROVIDER: Dominica Severin, MD  END OF SESSION:  OT End of Session - 08/06/22 1514     Visit Number 2    Number of Visits 9    Date for OT Re-Evaluation 08/30/22    Authorization Type [employer]    Progress Note Due on Visit 10    OT Start Time 1515    OT Stop Time 1555    OT Time Calculation (min) 40 min    Activity Tolerance Patient tolerated treatment well    Behavior During Therapy WFL for tasks assessed/performed             Past Medical History:  Diagnosis Date   Medical history non-contributory    PONV (postoperative nausea and vomiting)    pt states the combination of zofran, decadron and scope patch worked well for her PONV history.   Past Surgical History:  Procedure Laterality Date   ABDOMINAL HYSTERECTOMY     ANTERIOR CRUCIATE LIGAMENT REPAIR Right 12/25/2017   Procedure: KNEE ARTHROSCOPY WITH ANTERIOR CRUCIATE LIGAMENT (ACL) REPAIR with Allograft Achilles,  medial and lateral meniscectomy;  Surgeon: Vickki Hearing, MD;  Location: [facility];  Service: Orthopedics;  Laterality: Right;   CARPOMETACARPEL SUSPENSION PLASTY Right 06/11/2022   Procedure: Right thumb carpometacarpal arthroplasty with double tendon transfer and fiber lock suspension and repair as necessary.;  Surgeon: Dominica Severin, MD;  Location: [facility];  Service: Orthopedics;  Laterality: Right;  Block with IV Sedation   CESAREAN SECTION     X 2   COLONOSCOPY N/A 02/17/2015   Procedure: COLONOSCOPY;  Surgeon: Malissa Hippo, MD;  Location: [facility];  Service: Endoscopy;  Laterality: N/A;  930   KNEE ARTHROSCOPY WITH MEDIAL MENISECTOMY Right 04/24/2017   Procedure: KNEE ARTHROSCOPY WITH PARTIAL MEDIAL MENISECTOMY; ACL DEBRIEDMENT;  Surgeon: Vickki Hearing, MD;  Location: [facility];  Service: Orthopedics;   Laterality: Right;   Patient Active Problem List   Diagnosis Date Noted   Respiratory infection 04/17/2022   Physical exam, routine 08/11/2021   S/P ACL repair right 12/25/17 05/01/2017    ONSET DATE: 06/11/22  REFERRING DIAG: R Carpometacarpal Suspension Plasty  THERAPY DIAG:  Pain in right wrist  Stiffness of right wrist, not elsewhere classified  Other symptoms and signs involving the musculoskeletal system  Rationale for Evaluation and Treatment: Rehabilitation  SUBJECTIVE:   SUBJECTIVE STATEMENT: "I think I can keep this splint off now." Pt accompanied by: self  PERTINENT HISTORY: No significant PMH, right thumb carpometacarpal arthroplasty at the base of the thumb with suspension and double tendon transfer on 06/11/22.  PRECAUTIONS: Other: Hand Protocol  WEIGHT BEARING RESTRICTIONS: Yes no more than a cell phone  PAIN:  Are you having pain? No  FALLS: Has patient fallen in last 6 months? Yes. Number of falls 1  LIVING ENVIRONMENT: Lives with: lives with their spouse Lives in: House/apartment  PLOF: Independent  PATIENT GOALS: To get my strength back  NEXT MD VISIT: 08/08/22  OBJECTIVE:   HAND DOMINANCE: Left  ADLs: Overall ADLs: Pt reports difficulty with manipulating small objects, unable to lift anything to complete cooking or cleaning tasks.   FUNCTIONAL OUTCOME MEASURES: FOTO: 37.12  UPPER EXTREMITY ROM:     Active ROM Right eval  Wrist flexion 48  Wrist extension 45  Wrist ulnar deviation 47  Wrist radial deviation  5  Wrist pronation WFL  Wrist supination WFL  (Blank rows = not tested)  Active ROM Right eval  Thumb MCP (0-60) 30  Thumb IP (0-80) 45  Thumb Opposition to Small Finger 2.5cm  Index MCP (0-90) 75  Index PIP (0-100) 75  Index DIP (0-70) 55   Long MCP (0-90)  85  Long PIP (0-100)  80  Long DIP (0-70) 50   Ring MCP (0-90)  75  Ring PIP (0-100)  85  Ring DIP (0-70)  65  Little MCP (0-90)  75  Little PIP (0-100)  70   Little DIP (0-70) 70   (Blank rows = not tested)   UPPER EXTREMITY MMT:     MMT Right eval  Wrist flexion   Wrist extension   Wrist ulnar deviation   Wrist radial deviation   Wrist pronation   Wrist supination   (Blank rows = not tested)  HAND FUNCTION: ** Will assess on 1st treatment Grip strength: Right: - lbs; Left: - lbs, Lateral pinch: Right: - lbs, Left: - lbs, and 3 point pinch: Right: - lbs, Left: - lbs  COORDINATION: Will assess on 1st treatment 9 Hole Peg test: Right: -- sec; Left: - sec  SENSATION: WFL  EDEMA: Mild swelling noted in the wrist  OBSERVATIONS: mild to moderate fascial restrictions along extensor bundle and palmar aspect around thumb   TODAY'S TREATMENT:                                                                                                                              DATE:   08/06/22 -Natalie Hess Rolls: flexion/extension, ulnar/radial deviation, supination/pronation, x10 -P/ROM: wrist flexion, extension, supination/pronation, ulnar/radial deviation, x10 -A/ROM: wrist flexion, extension, ulnar/radial deviation, x10  -Digiflex: 3lbs, full squeeze x10, each digit x5 -Theraputty: red putty, roll into ball, flatten into pancake, PVC pipe to cut cookies, roll into log, lateral pinch x10, tripod pinch x10, roll into log, squeeze x10 -Manual Therapy: myofascial release and trigger point applied to extensor bundle of forearm, flexor and extensor tendons of wrist, and entirety of thumb, in order to reduce pain and fascial restrictions to improve ROM.     PATIENT EDUCATION: Education details: Theraputty Person educated: Patient Education method: Explanation, Demonstration, and Handouts Education comprehension: verbalized understanding and returned demonstration  HOME EXERCISE PROGRAM: 5/10: Wrist and Digit ROM 5/21: Theraputty (yellow)  GOALS: Goals reviewed with patient? Yes  SHORT TERM GOALS: Target date: 08/30/22  Pt will be provided with  and educated on HEP to improve pain and mobility in R hand required for ADL completion.  Goal status: IN PROGRESS  2.  Pt will decrease pain in R hand and shoulder to 2/10 or less in order to sleep for 3+ consecutive hours without waking due to pain. Goal status: IN PROGRESS  3.  Pt will increase ROM in left digits by 20+ degrees to improve ability to form a full grasp required for doing her hair  and makeup with dominant right hand. Goal status: IN PROGRESS  4.  Pt will increase dominant right grip strength by 10# and pinch strength by 14# or greater to improve  Goal status: IN PROGRESS  5.  Pt will decrease RUE fascial restrictions and swelling to minimal amounts or less to improve mobility required typing and writing.  Goal status: IN PROGRESS  6.   Pt will increase RUE strength to 5/5 or greater to improve ability to perform lifting tasks during housekeeping.  Goal status: IN PROGRESS   ASSESSMENT:  CLINICAL IMPRESSION: Pt seen for her 1st treatment session. She reported no pain at the start of the session, with mild soreness when OT completed P/ROM for stretching through the wrist. With pinching and in hand translation of sponges, pt having no difficulties or pain. OT had pt start working with theraputty, where she had increased pain in her thumb and presented with increased weakness with manipulating the putty. OT providing verbal and tactile cuing for positioning and technique, as well as applying manual therapy at the end of the session to relieve pain and fascial restrictions.   PERFORMANCE DEFICITS: in functional skills including ADLs, IADLs, sensation, edema, ROM, strength, pain, fascial restrictions, Fine motor control, Gross motor control, body mechanics, and UE functional use.   PLAN:  OT FREQUENCY: 2x/week  OT DURATION: 4 weeks  PLANNED INTERVENTIONS: self care/ADL training, therapeutic exercise, therapeutic activity, manual therapy, scar mobilization, passive range of  motion, functional mobility training, splinting, electrical stimulation, ultrasound, paraffin, moist heat, cryotherapy, patient/family education, coping strategies training, and DME and/or AE instructions  RECOMMENDED OTHER SERVICES: N/A  CONSULTED AND AGREED WITH PLAN OF CARE: Patient  PLAN FOR NEXT SESSION: Manual Therapy, P/ROM, Gentle ROM, Gentle Stretching, Theraputty  Trish Mage, OTR/L [facility] [phone] Kennyth Arnold, OT 07/26/2022, 10:48 AM

## 2022-08-08 DIAGNOSIS — M13841 Other specified arthritis, right hand: Secondary | ICD-10-CM | POA: Diagnosis not present

## 2022-08-08 DIAGNOSIS — Z4789 Encounter for other orthopedic aftercare: Secondary | ICD-10-CM | POA: Diagnosis not present

## 2022-08-14 ENCOUNTER — Encounter (HOSPITAL_COMMUNITY): Payer: Self-pay | Admitting: Occupational Therapy

## 2022-08-14 ENCOUNTER — Ambulatory Visit (HOSPITAL_COMMUNITY): Payer: 59 | Admitting: Occupational Therapy

## 2022-08-14 DIAGNOSIS — M25531 Pain in right wrist: Secondary | ICD-10-CM

## 2022-08-14 DIAGNOSIS — R29898 Other symptoms and signs involving the musculoskeletal system: Secondary | ICD-10-CM

## 2022-08-14 DIAGNOSIS — M25631 Stiffness of right wrist, not elsewhere classified: Secondary | ICD-10-CM

## 2022-08-14 NOTE — Therapy (Signed)
OUTPATIENT OCCUPATIONAL THERAPY ORTHO TREATMENT NOTE  Patient Name: Natalie Hess MRN: [id number] DOB:26-Apr-1960, 62 y.o., female Today's Date: 08/14/2022  PCP: Tommie Sams, DO REFERRING PROVIDER: Dominica Severin, MD  END OF SESSION:  OT End of Session - 08/14/22 1000     Visit Number 3    Number of Visits 9    Date for OT Re-Evaluation 08/30/22    Authorization Type [employer]    Progress Note Due on Visit 10    OT Start Time 0902    OT Stop Time [phone]    OT Time Calculation (min) 45 min    Activity Tolerance Patient tolerated treatment well    Behavior During Therapy [facility] for tasks assessed/performed             Past Medical History:  Diagnosis Date   Medical history non-contributory    PONV (postoperative nausea and vomiting)    pt states the combination of zofran, decadron and scope patch worked well for her PONV history.   Past Surgical History:  Procedure Laterality Date   ABDOMINAL HYSTERECTOMY     ANTERIOR CRUCIATE LIGAMENT REPAIR Right 12/25/2017   Procedure: KNEE ARTHROSCOPY WITH ANTERIOR CRUCIATE LIGAMENT (ACL) REPAIR with Allograft Achilles,  medial and lateral meniscectomy;  Surgeon: Vickki Hearing, MD;  Location: [facility];  Service: Orthopedics;  Laterality: Right;   CARPOMETACARPEL SUSPENSION PLASTY Right 06/11/2022   Procedure: Right thumb carpometacarpal arthroplasty with double tendon transfer and fiber lock suspension and repair as necessary.;  Surgeon: Dominica Severin, MD;  Location: [facility];  Service: Orthopedics;  Laterality: Right;  Block with IV Sedation   CESAREAN SECTION     X 2   COLONOSCOPY N/A 02/17/2015   Procedure: COLONOSCOPY;  Surgeon: Malissa Hippo, MD;  Location: [facility];  Service: Endoscopy;  Laterality: N/A;  930   KNEE ARTHROSCOPY WITH MEDIAL MENISECTOMY Right 04/24/2017   Procedure: KNEE ARTHROSCOPY WITH PARTIAL MEDIAL MENISECTOMY; ACL DEBRIEDMENT;  Surgeon: Vickki Hearing, MD;  Location: [facility];  Service: Orthopedics;   Laterality: Right;   Patient Active Problem List   Diagnosis Date Noted   Respiratory infection 04/17/2022   Physical exam, routine 08/11/2021   S/P ACL repair right 12/25/17 05/01/2017    ONSET DATE: 06/11/22  REFERRING DIAG: R Carpometacarpal Suspension Plasty  THERAPY DIAG:  Stiffness of right wrist, not elsewhere classified  Pain in right wrist  Other symptoms and signs involving the musculoskeletal system  Rationale for Evaluation and Treatment: Rehabilitation  SUBJECTIVE:   SUBJECTIVE STATEMENT: "My index finger has been stiff and swollen, then MD gave me a splint to wear at night on my finger." Pt accompanied by: self  PERTINENT HISTORY: No significant PMH, right thumb carpometacarpal arthroplasty at the base of the thumb with suspension and double tendon transfer on 06/11/22.  PRECAUTIONS: Other: Hand Protocol  WEIGHT BEARING RESTRICTIONS: Yes no more than a cell phone  PAIN:  Are you having pain? No  FALLS: Has patient fallen in last 6 months? Yes. Number of falls 1  LIVING ENVIRONMENT: Lives with: lives with their spouse Lives in: House/apartment  PLOF: Independent  PATIENT GOALS: To get my strength back  NEXT MD VISIT: 08/08/22  OBJECTIVE:   HAND DOMINANCE: Left  ADLs: Overall ADLs: Pt reports difficulty with manipulating small objects, unable to lift anything to complete cooking or cleaning tasks.   FUNCTIONAL OUTCOME MEASURES: FOTO: 37.12  UPPER EXTREMITY ROM:     Active ROM Right eval  Wrist flexion 48  Wrist extension 45  Wrist ulnar deviation 47  Wrist radial deviation 5  Wrist pronation WFL  Wrist supination WFL  (Blank rows = not tested)  Active ROM Right eval  Thumb MCP (0-60) 30  Thumb IP (0-80) 45  Thumb Opposition to Small Finger 2.5cm  Index MCP (0-90) 75  Index PIP (0-100) 75  Index DIP (0-70) 55   Long MCP (0-90)  85  Long PIP (0-100)  80  Long DIP (0-70) 50   Ring MCP (0-90)  75  Ring PIP (0-100)  85  Ring DIP  (0-70)  65  Little MCP (0-90)  75  Little PIP (0-100)  70  Little DIP (0-70) 70   (Blank rows = not tested)   UPPER EXTREMITY MMT:     MMT Right eval  Wrist flexion   Wrist extension   Wrist ulnar deviation   Wrist radial deviation   Wrist pronation   Wrist supination   (Blank rows = not tested)  HAND FUNCTION:  Grip strength: Right: 19 lbs; Left: 52 lbs, Lateral pinch: Right: 4 lbs, Left: 12 lbs, and 3 point pinch: Right: 4 lbs, Left: 10 lbs  COORDINATION:  9 Hole Peg test: Right: 18.18 sec; Left: 18.78 sec  SENSATION: WFL  EDEMA: Mild swelling noted in the wrist  OBSERVATIONS: mild to moderate fascial restrictions along extensor bundle and palmar aspect around thumb   TODAY'S TREATMENT:                                                                                                                              DATE:   08/14/22 -towel crunches x6 -towel wringing x10 -Wrist A/ROM: flexion, extension, ulnar/radial deviation, supination/pronation, x10 -Theraputty: push in 10 beads, roll into ball, pinch and pull to find 10 beads -9 hole peg test -pinch and grip testing -Wrist Strengthening: 2lb dumbbell, flexion, extension, ulnar/radial deviation, supination/pronation, x10  08/06/22 -Ball Rolls: flexion/extension, ulnar/radial deviation, supination/pronation, x10 -P/ROM: wrist flexion, extension, supination/pronation, ulnar/radial deviation, x10 -A/ROM: wrist flexion, extension, ulnar/radial deviation, x10  -Digiflex: 3lbs, full squeeze x10, each digit x5 -Theraputty: red putty, roll into ball, flatten into pancake, PVC pipe to cut cookies, roll into log, lateral pinch x10, tripod pinch x10, roll into log, squeeze x10 -Manual Therapy: myofascial release and trigger point applied to extensor bundle of forearm, flexor and extensor tendons of wrist, and entirety of thumb, in order to reduce pain and fascial restrictions to improve ROM.     PATIENT EDUCATION: Education  details: IT sales professional Person educated: Patient Education method: Explanation, Demonstration, and Handouts Education comprehension: verbalized understanding and returned demonstration  HOME EXERCISE PROGRAM: 5/10: Wrist and Digit ROM 5/21: Theraputty (yellow) 5/29: Wrist Strengthening  GOALS: Goals reviewed with patient? Yes  SHORT TERM GOALS: Target date: 08/30/22  Pt will be provided with and educated on HEP to improve pain and mobility in R hand required for ADL completion.  Goal status: IN PROGRESS  2.  Pt will decrease pain in R hand and shoulder to 2/10 or less in order to sleep for 3+ consecutive hours without waking due to pain. Goal status: IN PROGRESS  3.  Pt will increase ROM in left digits by 20+ degrees to improve ability to form a full grasp required for doing her hair and makeup with dominant right hand. Goal status: IN PROGRESS  4.  Pt will increase dominant right grip strength by 10# and pinch strength by 14# or greater to improve  Goal status: IN PROGRESS  5.  Pt will decrease RUE fascial restrictions and swelling to minimal amounts or less to improve mobility required typing and writing.  Goal status: IN PROGRESS  6.   Pt will increase RUE strength to 5/5 or greater to improve ability to perform lifting tasks during housekeeping.  Goal status: IN PROGRESS   ASSESSMENT:  CLINICAL IMPRESSION: This session pt reporting mild pain with exercises, specifically along the ulnar aspect of her wrist. She is able to continue and finish all exercises/tasks despite pain. This session OT had pt start with 2lb dumbbells for wrist and grip strengthening, as well as continued work with theraputty to encourage improving her grip. Verbal and visual cuing provided for maximizing her grip on objects by closing her fingers around the objects, as well as positioning and technique for all tasks.   PERFORMANCE DEFICITS: in functional skills including ADLs, IADLs, sensation,  edema, ROM, strength, pain, fascial restrictions, Fine motor control, Gross motor control, body mechanics, and UE functional use.   PLAN:  OT FREQUENCY: 2x/week  OT DURATION: 4 weeks  PLANNED INTERVENTIONS: self care/ADL training, therapeutic exercise, therapeutic activity, manual therapy, scar mobilization, passive range of motion, functional mobility training, splinting, electrical stimulation, ultrasound, paraffin, moist heat, cryotherapy, patient/family education, coping strategies training, and DME and/or AE instructions  RECOMMENDED OTHER SERVICES: N/A  CONSULTED AND AGREED WITH PLAN OF CARE: Patient  PLAN FOR NEXT SESSION: Manual Therapy, P/ROM, Gentle ROM, Gentle Stretching, Theraputty  Trish Mage, OTR/L [facility] [phone] Kennyth Arnold, OT 08/14/2022, 10:01 AM

## 2022-08-14 NOTE — Patient Instructions (Signed)
Strengthening Exercises  1) WRIST EXTENSION CURLS - TABLE  Hold a small free weight, rest your forearm on a table and bend your wrist up and down with your palm face down as shown.      2) WRIST FLEXION CURLS - TABLE  Hold a small free weight, rest your forearm on a table and bend your wrist up and down with your palm face up as shown.     3) FREE WEIGHT RADIAL/ULNAR DEVIATION - TABLE  Hold a small free weight, rest your forearm on a table and bend your wrist up and down with your palm facing towards the side as shown.     4) Pronation  Forearm supported on table with wrist in neutral position. Using a weight, roll wrist so that palm faces downward. Hold for 2 seconds and return to starting position.     5) Supination  Forearm supported on table with wrist in neutral position. Using a weight, roll wrist so that palm is now facing upward. Hold for 2 seconds and return to starting position.      *Complete exercises using 2-3 pound weight, 10-15 times each, 2-3 times per day*

## 2022-08-20 ENCOUNTER — Ambulatory Visit (HOSPITAL_COMMUNITY): Payer: 59 | Attending: Orthopedic Surgery | Admitting: Occupational Therapy

## 2022-08-20 DIAGNOSIS — M25531 Pain in right wrist: Secondary | ICD-10-CM | POA: Insufficient documentation

## 2022-08-20 DIAGNOSIS — R29898 Other symptoms and signs involving the musculoskeletal system: Secondary | ICD-10-CM | POA: Diagnosis not present

## 2022-08-20 DIAGNOSIS — M25631 Stiffness of right wrist, not elsewhere classified: Secondary | ICD-10-CM | POA: Diagnosis not present

## 2022-08-20 NOTE — Therapy (Signed)
OUTPATIENT OCCUPATIONAL THERAPY ORTHO TREATMENT NOTE  Patient Name: Natalie Hess MRN: [id number] DOB:[birth year], 62 y.o., female Today's Date: 08/20/2022  PCP: Tommie Sams, DO REFERRING PROVIDER: Dominica Severin, MD  END OF SESSION:  OT End of Session - 08/20/22 1208     Visit Number 4    Number of Visits 9    Date for OT Re-Evaluation 08/30/22    Authorization Type [employer]    Progress Note Due on Visit 10    OT Start Time 0905    OT Stop Time 0949    OT Time Calculation (min) 44 min    Activity Tolerance Patient tolerated treatment well    Behavior During Therapy [facility] for tasks assessed/performed            Past Medical History:  Diagnosis Date   Medical history non-contributory    PONV (postoperative nausea and vomiting)    pt states the combination of zofran, decadron and scope patch worked well for her PONV history.   Past Surgical History:  Procedure Laterality Date   ABDOMINAL HYSTERECTOMY     ANTERIOR CRUCIATE LIGAMENT REPAIR Right 12/25/2017   Procedure: KNEE ARTHROSCOPY WITH ANTERIOR CRUCIATE LIGAMENT (ACL) REPAIR with Allograft Achilles,  medial and lateral meniscectomy;  Surgeon: Vickki Hearing, MD;  Location: [facility];  Service: Orthopedics;  Laterality: Right;   CARPOMETACARPEL SUSPENSION PLASTY Right 06/11/2022   Procedure: Right thumb carpometacarpal arthroplasty with double tendon transfer and fiber lock suspension and repair as necessary.;  Surgeon: Dominica Severin, MD;  Location: [facility];  Service: Orthopedics;  Laterality: Right;  Block with IV Sedation   CESAREAN SECTION     X 2   COLONOSCOPY N/A 02/17/2015   Procedure: COLONOSCOPY;  Surgeon: Malissa Hippo, MD;  Location: [facility];  Service: Endoscopy;  Laterality: N/A;  930   KNEE ARTHROSCOPY WITH MEDIAL MENISECTOMY Right 04/24/2017   Procedure: KNEE ARTHROSCOPY WITH PARTIAL MEDIAL MENISECTOMY; ACL DEBRIEDMENT;  Surgeon: Vickki Hearing, MD;  Location: [facility];  Service: Orthopedics;  Laterality:  Right;   Patient Active Problem List   Diagnosis Date Noted   Respiratory infection 04/17/2022   Physical exam, routine 08/11/2021   S/P ACL repair right 12/25/17 05/01/2017    ONSET DATE: 06/11/22  REFERRING DIAG: R Carpometacarpal Suspension Plasty  THERAPY DIAG:  Stiffness of right wrist, not elsewhere classified  Pain in right wrist  Other symptoms and signs involving the musculoskeletal system  Rationale for Evaluation and Treatment: Rehabilitation  SUBJECTIVE:   SUBJECTIVE STATEMENT: "I don't think I'm going to be ready to go back to work, because I'm still so weak." Pt accompanied by: self  PERTINENT HISTORY: No significant PMH, right thumb carpometacarpal arthroplasty at the base of the thumb with suspension and double tendon transfer on 06/11/22.  PRECAUTIONS: Other: Hand Protocol  WEIGHT BEARING RESTRICTIONS: Yes no more than a cell phone  PAIN:  Are you having pain? No  FALLS: Has patient fallen in last 6 months? Yes. Number of falls 1  LIVING ENVIRONMENT: Lives with: lives with their spouse Lives in: House/apartment  PLOF: Independent  PATIENT GOALS: To get my strength back  NEXT MD VISIT: 08/08/22  OBJECTIVE:   HAND DOMINANCE: Left  ADLs: Overall ADLs: Pt reports difficulty with manipulating small objects, unable to lift anything to complete cooking or cleaning tasks.   FUNCTIONAL OUTCOME MEASURES: FOTO: 37.12  UPPER EXTREMITY ROM:     Active ROM Right eval  Wrist flexion 48  Wrist extension 45  Wrist ulnar deviation 47  Wrist radial deviation 5  Wrist pronation WFL  Wrist supination WFL  (Blank rows = not tested)  Active ROM Right eval  Thumb MCP (0-60) 30  Thumb IP (0-80) 45  Thumb Opposition to Small Finger 2.5cm  Index MCP (0-90) 75  Index PIP (0-100) 75  Index DIP (0-70) 55   Long MCP (0-90)  85  Long PIP (0-100)  80  Long DIP (0-70) 50   Ring MCP (0-90)  75  Ring PIP (0-100)  85  Ring DIP (0-70)  65  Little MCP  (0-90)  75  Little PIP (0-100)  70  Little DIP (0-70) 70   (Blank rows = not tested)   UPPER EXTREMITY MMT:     MMT Right eval  Wrist flexion   Wrist extension   Wrist ulnar deviation   Wrist radial deviation   Wrist pronation   Wrist supination   (Blank rows = not tested)  HAND FUNCTION:  Grip strength: Right: 19 lbs; Left: 52 lbs, Lateral pinch: Right: 4 lbs, Left: 12 lbs, and 3 point pinch: Right: 4 lbs, Left: 10 lbs  COORDINATION:  9 Hole Peg test: Right: 18.18 sec; Left: 18.78 sec  SENSATION: WFL  EDEMA: Mild swelling noted in the wrist  OBSERVATIONS: mild to moderate fascial restrictions along extensor bundle and palmar aspect around thumb   TODAY'S TREATMENT:                                                                                                                              DATE:   08/20/22 -Wrist A/ROM: flexion, extension, ulnar/radial deviation, supination/pronation, x15 -ABC's in the air holding green ball -Wrist Strengthening: 2lb dumbbell, flexion, extension, ulnar/radial deviation, supination/pronation, x10 -Pinch Strengthening: red, green, and blue resistance clips, tripod pinch up vertical pole, lateral pinch down to horizontal pole -Pinch Stacking: green resistance clip, stacking 6 cubes -Gripper: 15# picking up 8 medium beads vertically, 20# picking up 8 small beads horizontally -Digiflex: 7lbs full squeeze x10, 3lbs each digit squeeze x6  08/14/22 -towel crunches x6 -towel wringing x10 -Wrist A/ROM: flexion, extension, ulnar/radial deviation, supination/pronation, x10 -Theraputty: push in 10 beads, roll into ball, pinch and pull to find 10 beads -9 hole peg test -pinch and grip testing -Wrist Strengthening: 2lb dumbbell, flexion, extension, ulnar/radial deviation, supination/pronation, x10  08/06/22 -Ball Rolls: flexion/extension, ulnar/radial deviation, supination/pronation, x10 -P/ROM: wrist flexion, extension, supination/pronation,  ulnar/radial deviation, x10 -A/ROM: wrist flexion, extension, ulnar/radial deviation, x10  -Digiflex: 3lbs, full squeeze x10, each digit x5 -Theraputty: red putty, roll into ball, flatten into pancake, PVC pipe to cut cookies, roll into log, lateral pinch x10, tripod pinch x10, roll into log, squeeze x10 -Manual Therapy: myofascial release and trigger point applied to extensor bundle of forearm, flexor and extensor tendons of wrist, and entirety of thumb, in order to reduce pain and fascial restrictions to improve ROM.     PATIENT EDUCATION: Education details: Continue HEP  Person educated: Patient Education method: Explanation, Demonstration, and Handouts Education comprehension: verbalized understanding and returned demonstration  HOME EXERCISE PROGRAM: 5/10: Wrist and Digit ROM 5/21: Theraputty (yellow) 5/29: Wrist Strengthening  GOALS: Goals reviewed with patient? Yes  SHORT TERM GOALS: Target date: 08/30/22  Pt will be provided with and educated on HEP to improve pain and mobility in R hand required for ADL completion.  Goal status: IN PROGRESS  2.  Pt will decrease pain in R hand and shoulder to 2/10 or less in order to sleep for 3+ consecutive hours without waking due to pain. Goal status: IN PROGRESS  3.  Pt will increase ROM in left digits by 20+ degrees to improve ability to form a full grasp required for doing her hair and makeup with dominant right hand. Goal status: IN PROGRESS  4.  Pt will increase dominant right grip strength by 10# and pinch strength by 14# or greater to improve  Goal status: IN PROGRESS  5.  Pt will decrease RUE fascial restrictions and swelling to minimal amounts or less to improve mobility required typing and writing.  Goal status: IN PROGRESS  6.   Pt will increase RUE strength to 5/5 or greater to improve ability to perform lifting tasks during housekeeping.  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 This session her ROM has improved to near functional limits, however her strength continues to be limiting and she presents with pain in her thumb and index finger when pinching and gripping. OT providing verbal cuing for maintaining good positioning with all exercises, as well as to focus on lifting/activating from the radial side, instead of relying on her ulnar side. Visual cuing provided for technique with all exercises.   PERFORMANCE DEFICITS: in functional skills including ADLs, IADLs, sensation, edema, ROM, strength, pain, fascial restrictions, Fine motor control, Gross motor control, body mechanics, and UE functional use.   PLAN:  OT FREQUENCY: 2x/week  OT DURATION: 4 weeks  PLANNED INTERVENTIONS: self care/ADL training, therapeutic exercise, therapeutic activity, manual therapy, scar mobilization, passive range of motion, functional mobility training, splinting, electrical stimulation, ultrasound, paraffin, moist heat, cryotherapy, patient/family education, coping strategies training, and DME and/or AE instructions  RECOMMENDED OTHER SERVICES: N/A  CONSULTED AND AGREED WITH PLAN OF CARE: Patient  PLAN FOR NEXT SESSION: Manual Therapy, P/ROM, Gentle ROM, Gentle Stretching, progress to red putty  Trish Mage, OTR/L [facility] [phone] Lorenia Hoston Rosemarie Beath, OT 08/20/2022, 12:09 PM

## 2022-08-22 ENCOUNTER — Ambulatory Visit (HOSPITAL_COMMUNITY): Payer: 59 | Admitting: Occupational Therapy

## 2022-08-22 DIAGNOSIS — M25531 Pain in right wrist: Secondary | ICD-10-CM | POA: Diagnosis not present

## 2022-08-22 DIAGNOSIS — R29898 Other symptoms and signs involving the musculoskeletal system: Secondary | ICD-10-CM

## 2022-08-22 DIAGNOSIS — M25631 Stiffness of right wrist, not elsewhere classified: Secondary | ICD-10-CM

## 2022-08-22 NOTE — Therapy (Signed)
OUTPATIENT OCCUPATIONAL THERAPY ORTHO TREATMENT NOTE Mini Reassessment  Patient Name: Natalie Hess MRN: [id number] DOB:[birth year], 62 y.o., female Today's Date: 08/22/2022  PCP: Tommie Sams, DO REFERRING PROVIDER: Dominica Severin, MD  Progress Note Reporting Period 07/26/22 to 08/22/22  See note below for Objective Data and Assessment of Progress/Goals.    END OF SESSION:  OT End of Session - 08/22/22 1522     Visit Number 5    Number of Visits 9    Date for OT Re-Evaluation 09/13/22    Authorization Type [employer]    Progress Note Due on Visit 10    OT Start Time 1306    OT Stop Time 1351    OT Time Calculation (min) 45 min    Activity Tolerance Patient tolerated treatment well    Behavior During Therapy WFL for tasks assessed/performed             Past Medical History:  Diagnosis Date   Medical history non-contributory    PONV (postoperative nausea and vomiting)    pt states the combination of zofran, decadron and scope patch worked well for her PONV history.   Past Surgical History:  Procedure Laterality Date   ABDOMINAL HYSTERECTOMY     ANTERIOR CRUCIATE LIGAMENT REPAIR Right 12/25/2017   Procedure: KNEE ARTHROSCOPY WITH ANTERIOR CRUCIATE LIGAMENT (ACL) REPAIR with Allograft Achilles,  medial and lateral meniscectomy;  Surgeon: Vickki Hearing, MD;  Location: [facility];  Service: Orthopedics;  Laterality: Right;   CARPOMETACARPEL SUSPENSION PLASTY Right 06/11/2022   Procedure: Right thumb carpometacarpal arthroplasty with double tendon transfer and fiber lock suspension and repair as necessary.;  Surgeon: Dominica Severin, MD;  Location: [facility];  Service: Orthopedics;  Laterality: Right;  Block with IV Sedation   CESAREAN SECTION     X 2   COLONOSCOPY N/A 02/17/2015   Procedure: COLONOSCOPY;  Surgeon: Malissa Hippo, MD;  Location: [facility];  Service: Endoscopy;  Laterality: N/A;  930   KNEE ARTHROSCOPY WITH MEDIAL MENISECTOMY Right 04/24/2017   Procedure: KNEE  ARTHROSCOPY WITH PARTIAL MEDIAL MENISECTOMY; ACL DEBRIEDMENT;  Surgeon: Vickki Hearing, MD;  Location: [facility];  Service: Orthopedics;  Laterality: Right;   Patient Active Problem List   Diagnosis Date Noted   Respiratory infection 04/17/2022   Physical exam, routine 08/11/2021   S/P ACL repair right 12/25/17 05/01/2017    ONSET DATE: 06/11/22  REFERRING DIAG: R Carpometacarpal Suspension Plasty  THERAPY DIAG:  Stiffness of right wrist, not elsewhere classified  Pain in right wrist  Other symptoms and signs involving the musculoskeletal system  Rationale for Evaluation and Treatment: Rehabilitation  SUBJECTIVE:   SUBJECTIVE STATEMENT: "I can't use my thumb without pain." Pt accompanied by: self  PERTINENT HISTORY: No significant PMH, right thumb carpometacarpal arthroplasty at the base of the thumb with suspension and double tendon transfer on 06/11/22.  PRECAUTIONS: Other: Hand Protocol  WEIGHT BEARING RESTRICTIONS: Yes no more than a cell phone  PAIN:  Are you having pain? No  FALLS: Has patient fallen in last 6 months? Yes. Number of falls 1  LIVING ENVIRONMENT: Lives with: lives with their spouse Lives in: House/apartment  PLOF: Independent  PATIENT GOALS: To get my strength back  NEXT MD VISIT: 08/08/22  OBJECTIVE:   HAND DOMINANCE: Left  ADLs: Overall ADLs: Pt reports difficulty with manipulating small objects, unable to lift anything to complete cooking or cleaning tasks.   FUNCTIONAL OUTCOME MEASURES: FOTO: 37.12 08/22/22: 64.55  UPPER EXTREMITY ROM:  Active ROM Right eval Right 08/22/22  Wrist flexion 48 50  Wrist extension 45 59  Wrist ulnar deviation 47 60  Wrist radial deviation 5 15  Wrist pronation [facility] WFL  Wrist supination WFL WFL  (Blank rows = not tested)  Active ROM Right eval Right 08/22/22  Thumb MCP (0-60) 30 50  Thumb IP (0-80) 45 50  Thumb Opposition to Small Finger 2.5cm Able  Index MCP (0-90) 75 80  Index PIP  (0-100) 75 80  Index DIP (0-70) 55  65  Long MCP (0-90)  85 85  Long PIP (0-100)  80 70  Long DIP (0-70) 50  70  Ring MCP (0-90)  75 82  Ring PIP (0-100)  85 75  Ring DIP (0-70)  65 70  Little MCP (0-90)  75 83  Little PIP (0-100)  70 75  Little DIP (0-70) 70  80  (Blank rows = not tested)   UPPER EXTREMITY MMT:     MMT Right eval  Wrist flexion 4+/5  Wrist extension 5/5  Wrist ulnar deviation 4+/5  Wrist radial deviation 4+/5  Wrist pronation 4/5  Wrist supination 4/5  (Blank rows = not tested)  HAND FUNCTION:  Grip strength: Right: 19 lbs; Left: 52 lbs, Lateral pinch: Right: 4 lbs, Left: 12 lbs, and 3 point pinch: Right: 4 lbs, Left: 10 lbs Grip Strength: 08/22/22: Right: 20 lbs; Lateral pinch: Right: 5lbs; 3 Point pinch: Right: 6lbs  COORDINATION:  9 Hole Peg test: Right: 18.18 sec; Left: 18.78 sec  SENSATION: WFL  EDEMA: Mild swelling noted in the wrist  OBSERVATIONS: mild to moderate fascial restrictions along extensor bundle and palmar aspect around thumb   TODAY'S TREATMENT:                                                                                                                              DATE:   08/22/22 -Wrist A/ROM: flexion, extension, ulnar/radial deviation, supination/pronation, x15 -Digit ROM: composite flexion, abduction/adduction, finger taps, opposition, x15 -Wrist Strengthening: 3lbs supination, ulnar/radial deviation, flexion, extension, x8 -Red theraputty: roll into ball, flatten into pancake, roll into log, pinch x10, grip x10 -Measurements for mini reassessment  08/20/22 -Wrist A/ROM: flexion, extension, ulnar/radial deviation, supination/pronation, x15 -ABC's in the air holding green ball -Wrist Strengthening: 2lb dumbbell, flexion, extension, ulnar/radial deviation, supination/pronation, x10 -Pinch Strengthening: red, green, and blue resistance clips, tripod pinch up vertical pole, lateral pinch down to horizontal pole -Pinch Stacking:  green resistance clip, stacking 6 cubes -Gripper: 15# picking up 8 medium beads vertically, 20# picking up 8 small beads horizontally -Digiflex: 7lbs full squeeze x10, 3lbs each digit squeeze x6  08/14/22 -towel crunches x6 -towel wringing x10 -Wrist A/ROM: flexion, extension, ulnar/radial deviation, supination/pronation, x10 -Theraputty: push in 10 beads, roll into ball, pinch and pull to find 10 beads -9 hole peg test -pinch and grip testing -Wrist Strengthening: 2lb dumbbell, flexion, extension, ulnar/radial deviation, supination/pronation, x10   PATIENT EDUCATION: Education  details: upgraded to red putty Person educated: Patient Education method: Explanation, Demonstration, and Handouts Education comprehension: verbalized understanding and returned demonstration  HOME EXERCISE PROGRAM: 5/10: Wrist and Digit ROM 5/21: Theraputty (yellow) 5/29: Wrist Strengthening  GOALS: Goals reviewed with patient? Yes  SHORT TERM GOALS: Target date: 08/30/22  Pt will be provided with and educated on HEP to improve pain and mobility in R hand required for ADL completion.  Goal status: IN PROGRESS  2.  Pt will decrease pain in R hand and shoulder to 2/10 or less in order to sleep for 3+ consecutive hours without waking due to pain. Goal status: IN PROGRESS  3.  Pt will increase ROM in left digits by 20+ degrees to improve ability to form a full grasp required for doing her hair and makeup with dominant right hand. Goal status: IN PROGRESS  4.  Pt will increase dominant right grip strength by 10# and pinch strength by 14# or greater to improve  Goal status: IN PROGRESS  5.  Pt will decrease RUE fascial restrictions and swelling to minimal amounts or less to improve mobility required typing and writing.  Goal status: IN PROGRESS  6.   Pt will increase RUE strength to 5/5 or greater to improve ability to perform lifting tasks during housekeeping.  Goal status: IN  PROGRESS   ASSESSMENT:  CLINICAL IMPRESSION: Pt completed a mini-reassessment this session, she is continuing to make good ROM improvements, as well as 1lb improvements in grip and pinch strength. This session she continued to work on overall mobility and strength, reporting pain and limitations from the thumb, affecting her ability to grip fully and maintain her grasp on heavier items. OT recommending 2 more therapy sessions before pt returns to work, in order to focus on grip and pinch strengthening to improve independence with ADL's and IADL's. This session verbal and visual cuing provided for positioning and technique with all exercises.   PERFORMANCE DEFICITS: in functional skills including ADLs, IADLs, sensation, edema, ROM, strength, pain, fascial restrictions, Fine motor control, Gross motor control, body mechanics, and UE functional use.   PLAN:  OT FREQUENCY: 2x/week  OT DURATION: 4 weeks  PLANNED INTERVENTIONS: self care/ADL training, therapeutic exercise, therapeutic activity, manual therapy, scar mobilization, passive range of motion, functional mobility training, splinting, electrical stimulation, ultrasound, paraffin, moist heat, cryotherapy, patient/family education, coping strategies training, and DME and/or AE instructions  RECOMMENDED OTHER SERVICES: N/A  CONSULTED AND AGREED WITH PLAN OF CARE: Patient  PLAN FOR NEXT SESSION: Manual Therapy, P/ROM, Gentle ROM, Gentle Stretching, progress to red putty  Trish Mage, OTR/L [facility] [phone] Sharice Harriss Rosemarie Beath, OT 08/22/2022, 3:44 PM

## 2022-09-02 ENCOUNTER — Ambulatory Visit (HOSPITAL_COMMUNITY): Payer: 59 | Admitting: Occupational Therapy

## 2022-09-02 ENCOUNTER — Encounter (HOSPITAL_COMMUNITY): Payer: Self-pay | Admitting: Occupational Therapy

## 2022-09-02 DIAGNOSIS — R29898 Other symptoms and signs involving the musculoskeletal system: Secondary | ICD-10-CM

## 2022-09-02 DIAGNOSIS — M25631 Stiffness of right wrist, not elsewhere classified: Secondary | ICD-10-CM

## 2022-09-02 DIAGNOSIS — M25531 Pain in right wrist: Secondary | ICD-10-CM | POA: Diagnosis not present

## 2022-09-02 NOTE — Therapy (Signed)
OUTPATIENT OCCUPATIONAL THERAPY ORTHO TREATMENT NOTE   Patient Name: Natalie Hess MRN: [id number] DOB:1960-08-13, 62 y.o., female Today's Date: 09/02/2022  PCP: Tommie Sams, DO REFERRING PROVIDER: Dominica Severin, MD   END OF SESSION:  OT End of Session - 09/02/22 1134     Visit Number 6    Number of Visits 9    Date for OT Re-Evaluation 09/13/22    Authorization Type [employer]    Progress Note Due on Visit 10    OT Start Time 0900    OT Stop Time 0943    OT Time Calculation (min) 43 min    Activity Tolerance Patient tolerated treatment well    Behavior During Therapy WFL for tasks assessed/performed              Past Medical History:  Diagnosis Date   Medical history non-contributory    PONV (postoperative nausea and vomiting)    pt states the combination of zofran, decadron and scope patch worked well for her PONV history.   Past Surgical History:  Procedure Laterality Date   ABDOMINAL HYSTERECTOMY     ANTERIOR CRUCIATE LIGAMENT REPAIR Right 12/25/2017   Procedure: KNEE ARTHROSCOPY WITH ANTERIOR CRUCIATE LIGAMENT (ACL) REPAIR with Allograft Achilles,  medial and lateral meniscectomy;  Surgeon: Vickki Hearing, MD;  Location: [facility];  Service: Orthopedics;  Laterality: Right;   CARPOMETACARPEL SUSPENSION PLASTY Right 06/11/2022   Procedure: Right thumb carpometacarpal arthroplasty with double tendon transfer and fiber lock suspension and repair as necessary.;  Surgeon: Dominica Severin, MD;  Location: [facility];  Service: Orthopedics;  Laterality: Right;  Block with IV Sedation   CESAREAN SECTION     X 2   COLONOSCOPY N/A 02/17/2015   Procedure: COLONOSCOPY;  Surgeon: Malissa Hippo, MD;  Location: [facility];  Service: Endoscopy;  Laterality: N/A;  930   KNEE ARTHROSCOPY WITH MEDIAL MENISECTOMY Right 04/24/2017   Procedure: KNEE ARTHROSCOPY WITH PARTIAL MEDIAL MENISECTOMY; ACL DEBRIEDMENT;  Surgeon: Vickki Hearing, MD;  Location: [facility];  Service: Orthopedics;   Laterality: Right;   Patient Active Problem List   Diagnosis Date Noted   Respiratory infection 04/17/2022   Physical exam, routine 08/11/2021   S/P ACL repair right 12/25/17 05/01/2017    ONSET DATE: 06/11/22  REFERRING DIAG: R Carpometacarpal Suspension Plasty  THERAPY DIAG:  Stiffness of right wrist, not elsewhere classified  Pain in right wrist  Other symptoms and signs involving the musculoskeletal system  Rationale for Evaluation and Treatment: Rehabilitation  SUBJECTIVE:   SUBJECTIVE STATEMENT: "I can't use my thumb without pain." Pt accompanied by: self  PERTINENT HISTORY: No significant PMH, right thumb carpometacarpal arthroplasty at the base of the thumb with suspension and double tendon transfer on 06/11/22.  PRECAUTIONS: Other: Hand Protocol  WEIGHT BEARING RESTRICTIONS: Yes no more than a cell phone  PAIN:  Are you having pain? No  FALLS: Has patient fallen in last 6 months? Yes. Number of falls 1  LIVING ENVIRONMENT: Lives with: lives with their spouse Lives in: House/apartment  PLOF: Independent  PATIENT GOALS: To get my strength back  NEXT MD VISIT: 08/08/22  OBJECTIVE:   HAND DOMINANCE: Left  ADLs: Overall ADLs: Pt reports difficulty with manipulating small objects, unable to lift anything to complete cooking or cleaning tasks.   FUNCTIONAL OUTCOME MEASURES: FOTO: 37.12 08/22/22: 64.55  UPPER EXTREMITY ROM:     Active ROM Right eval Right 08/22/22  Wrist flexion 48 50  Wrist extension 45 59  Wrist  ulnar deviation 47 60  Wrist radial deviation 5 15  Wrist pronation [facility] WFL  Wrist supination [facility] WFL  (Blank rows = not tested)  Active ROM Right eval Right 08/22/22  Thumb MCP (0-60) 30 50  Thumb IP (0-80) 45 50  Thumb Opposition to Small Finger 2.5cm Able  Index MCP (0-90) 75 80  Index PIP (0-100) 75 80  Index DIP (0-70) 55  65  Long MCP (0-90)  85 85  Long PIP (0-100)  80 70  Long DIP (0-70) 50  70  Ring MCP (0-90)  75 82   Ring PIP (0-100)  85 75  Ring DIP (0-70)  65 70  Little MCP (0-90)  75 83  Little PIP (0-100)  70 75  Little DIP (0-70) 70  80  (Blank rows = not tested)   UPPER EXTREMITY MMT:     MMT Right eval  Wrist flexion 4+/5  Wrist extension 5/5  Wrist ulnar deviation 4+/5  Wrist radial deviation 4+/5  Wrist pronation 4/5  Wrist supination 4/5  (Blank rows = not tested)  HAND FUNCTION:  Grip strength: Right: 19 lbs; Left: 52 lbs, Lateral pinch: Right: 4 lbs, Left: 12 lbs, and 3 point pinch: Right: 4 lbs, Left: 10 lbs Grip Strength: 08/22/22: Right: 20 lbs; Lateral pinch: Right: 5lbs; 3 Point pinch: Right: 6lbs  COORDINATION:  9 Hole Peg test: Right: 18.18 sec; Left: 18.78 sec  SENSATION: WFL  EDEMA: Mild swelling noted in the wrist  OBSERVATIONS: mild to moderate fascial restrictions along extensor bundle and palmar aspect around thumb   TODAY'S TREATMENT:                                                                                                                              DATE:   09/02/22 -Newman Pies Rolls: flexion/extension, supination/pronation, ulnar/radial deviation, x15 -Digit ROM: composite flexion, abduction/adduction, finger taps, opposition, x15 -Wrist Strengthening: 3lb dumbbells, flexion, extension, ulnar/radial deviation, supination/pronation, x10 -Wrist ABC's with red weighted ball -Pinch Stacking: green resistance clip, stacking 6 cubes x2 -Digiflex: 7lbs full squeeze x10, 3lbs each digit x6 -Thumb stretches: abduction, extension, 5x10" -Gripper: 22lbs, vertical 5 large beads, 8 medium beads, horizontal 8 small beads\  08/22/22 -Wrist A/ROM: flexion, extension, ulnar/radial deviation, supination/pronation, x15 -Digit ROM: composite flexion, abduction/adduction, finger taps, opposition, x15 -Wrist Strengthening: 3lbs supination, ulnar/radial deviation, flexion, extension, x8 -Red theraputty: roll into ball, flatten into pancake, roll into log, pinch x10, grip  x10 -Measurements for mini reassessment  08/20/22 -Wrist A/ROM: flexion, extension, ulnar/radial deviation, supination/pronation, x15 -ABC's in the air holding green ball -Wrist Strengthening: 2lb dumbbell, flexion, extension, ulnar/radial deviation, supination/pronation, x10 -Pinch Strengthening: red, green, and blue resistance clips, tripod pinch up vertical pole, lateral pinch down to horizontal pole -Pinch Stacking: green resistance clip, stacking 6 cubes -Gripper: 15# picking up 8 medium beads vertically, 20# picking up 8 small beads horizontally -Digiflex: 7lbs full squeeze x10, 3lbs each digit squeeze x6  PATIENT EDUCATION: Education details: Continue HEP Person educated: Patient Education method: Programmer, multimedia, Demonstration, and Handouts Education comprehension: verbalized understanding and returned demonstration  HOME EXERCISE PROGRAM: 5/10: Wrist and Digit ROM 5/21: Theraputty (yellow) 5/29: Wrist Strengthening  GOALS: Goals reviewed with patient? Yes  SHORT TERM GOALS: Target date: 08/30/22  Pt will be provided with and educated on HEP to improve pain and mobility in R hand required for ADL completion.  Goal status: IN PROGRESS  2.  Pt will decrease pain in R hand and shoulder to 2/10 or less in order to sleep for 3+ consecutive hours without waking due to pain. Goal status: IN PROGRESS  3.  Pt will increase ROM in left digits by 20+ degrees to improve ability to form a full grasp required for doing her hair and makeup with dominant right hand. Goal status: IN PROGRESS  4.  Pt will increase dominant right grip strength by 10# and pinch strength by 14# or greater to improve  Goal status: IN PROGRESS  5.  Pt will decrease RUE fascial restrictions and swelling to minimal amounts or less to improve mobility required typing and writing.  Goal status: IN PROGRESS  6.   Pt will increase RUE strength to 5/5 or greater to improve ability to perform lifting tasks during  housekeeping.  Goal status: IN PROGRESS   ASSESSMENT:  CLINICAL IMPRESSION: This session pt continuing to work on endurance and strengthening. With all strengthening/gripping exercises, pt reporting increased pain in her thumb. She was able to tolerate increasing grip resistance to 22lbs this session, with no compensatory movements to maintain exercise. OT continuing to provide verbal and visual cuing for positioning and technique.   PERFORMANCE DEFICITS: in functional skills including ADLs, IADLs, sensation, edema, ROM, strength, pain, fascial restrictions, Fine motor control, Gross motor control, body mechanics, and UE functional use.   PLAN:  OT FREQUENCY: 2x/week  OT DURATION: 4 weeks  PLANNED INTERVENTIONS: self care/ADL training, therapeutic exercise, therapeutic activity, manual therapy, scar mobilization, passive range of motion, functional mobility training, splinting, electrical stimulation, ultrasound, paraffin, moist heat, cryotherapy, patient/family education, coping strategies training, and DME and/or AE instructions  RECOMMENDED OTHER SERVICES: N/A  CONSULTED AND AGREED WITH PLAN OF CARE: Patient  PLAN FOR NEXT SESSION: Manual Therapy, P/ROM, Gentle ROM, Gentle Stretching, progress to red putty  Trish Mage, OTR/L [facility] [phone] Maurina Fawaz Rosemarie Beath, OT 09/02/2022, 11:35 AM

## 2022-09-10 ENCOUNTER — Ambulatory Visit (HOSPITAL_COMMUNITY): Payer: 59 | Admitting: Occupational Therapy

## 2022-09-10 ENCOUNTER — Encounter (HOSPITAL_COMMUNITY): Payer: Self-pay | Admitting: Occupational Therapy

## 2022-09-10 DIAGNOSIS — M25531 Pain in right wrist: Secondary | ICD-10-CM

## 2022-09-10 DIAGNOSIS — M25631 Stiffness of right wrist, not elsewhere classified: Secondary | ICD-10-CM

## 2022-09-10 DIAGNOSIS — R29898 Other symptoms and signs involving the musculoskeletal system: Secondary | ICD-10-CM | POA: Diagnosis not present

## 2022-09-10 NOTE — Therapy (Signed)
OUTPATIENT OCCUPATIONAL THERAPY ORTHO TREATMENT NOTE AND DISCHARGE NOTE   Patient Name: Natalie Hess MRN: [id number] DOB:1960-08-02, 62 y.o., female Today's Date: 09/10/2022  PCP: Tommie Sams, DO REFERRING PROVIDER: Dominica Severin, MD  OCCUPATIONAL THERAPY DISCHARGE SUMMARY  Visits from Start of Care: 7  Current functional level related to goals / functional outcomes: Pt has met 3 out of 6 goals. She has bee provided a comprehensive HEP, her pain is minimal, and her fascial restrictions have improved.    Remaining deficits: Pt has improved ROM and strength, however they are not back to her "normal" limits. Pt reports she will continue to work on deficits with HEP at home.    Education / Equipment: Pt has been provided a comprehensive HEP for improved mobility and strength.    Plan: Patient request discharge as she is returning to work and will not have space in her schedule to continue therapy. She has met 3 out of 6 goals and made good progress on the rest.    END OF SESSION:  OT End of Session - 09/10/22 1224     Visit Number 7    Number of Visits 9    Date for OT Re-Evaluation 09/13/22    Authorization Type [employer]    Progress Note Due on Visit 10    OT Start Time 1120    OT Stop Time 1201    OT Time Calculation (min) 41 min    Activity Tolerance Patient tolerated treatment well    Behavior During Therapy WFL for tasks assessed/performed               Past Medical History:  Diagnosis Date   Medical history non-contributory    PONV (postoperative nausea and vomiting)    pt states the combination of zofran, decadron and scope patch worked well for her PONV history.   Past Surgical History:  Procedure Laterality Date   ABDOMINAL HYSTERECTOMY     ANTERIOR CRUCIATE LIGAMENT REPAIR Right 12/25/2017   Procedure: KNEE ARTHROSCOPY WITH ANTERIOR CRUCIATE LIGAMENT (ACL) REPAIR with Allograft Achilles,  medial and lateral meniscectomy;  Surgeon: Vickki Hearing,  MD;  Location: [facility];  Service: Orthopedics;  Laterality: Right;   CARPOMETACARPEL SUSPENSION PLASTY Right 06/11/2022   Procedure: Right thumb carpometacarpal arthroplasty with double tendon transfer and fiber lock suspension and repair as necessary.;  Surgeon: Dominica Severin, MD;  Location: [facility];  Service: Orthopedics;  Laterality: Right;  Block with IV Sedation   CESAREAN SECTION     X 2   COLONOSCOPY N/A 02/17/2015   Procedure: COLONOSCOPY;  Surgeon: Malissa Hippo, MD;  [facility]: [facility];  Service: Endoscopy;  Laterality: N/A;  930   KNEE ARTHROSCOPY WITH MEDIAL MENISECTOMY Right 04/24/2017   Procedure: KNEE ARTHROSCOPY WITH PARTIAL MEDIAL MENISECTOMY; ACL DEBRIEDMENT;  Surgeon: Vickki Hearing, MD;  Location: [facility];  Service: Orthopedics;  Laterality: Right;   Patient Active Problem List   Diagnosis Date Noted   Respiratory infection 04/17/2022   Physical exam, routine 08/11/2021   S/P ACL repair right 12/25/17 05/01/2017    ONSET DATE: 06/11/22  REFERRING DIAG: R Carpometacarpal Suspension Plasty  THERAPY DIAG:  Stiffness of right wrist, not elsewhere classified  Pain in right wrist  Other symptoms and signs involving the musculoskeletal system  Rationale for Evaluation and Treatment: Rehabilitation  SUBJECTIVE:   SUBJECTIVE STATEMENT: "I'm still sore." Pt accompanied by: self  PERTINENT HISTORY: No significant PMH, right thumb carpometacarpal arthroplasty at the base of the  thumb with suspension and double tendon transfer on 06/11/22.  PRECAUTIONS: Other: Hand Protocol  WEIGHT BEARING RESTRICTIONS: Yes no more than a cell phone  PAIN:  Are you having pain? No  FALLS: Has patient fallen in last 6 months? Yes. Number of falls 1  LIVING ENVIRONMENT: Lives with: lives with their spouse Lives in: House/apartment  PLOF: Independent  PATIENT GOALS: To get my strength back  NEXT MD VISIT: 08/08/22  OBJECTIVE:   HAND DOMINANCE: Left  ADLs: Overall  ADLs: Pt reports difficulty with manipulating small objects, unable to lift anything to complete cooking or cleaning tasks.   FUNCTIONAL OUTCOME MEASURES: FOTO: 37.12 08/22/22: 64.55 09/10/22: 64.54  UPPER EXTREMITY ROM:     Active ROM Right eval Right 08/22/22 Right 09/10/22  Wrist flexion 48 50 64  Wrist extension 45 59 60  Wrist ulnar deviation 47 60 57  Wrist radial deviation 5 15 14   Wrist pronation [facility] WFL  Wrist supination [facility] WFL  (Blank rows = not tested)  Active ROM Right eval Right 08/22/22 Right 09/10/22  Thumb MCP (0-60) 30 50 45  Thumb IP (0-80) 45 50 45  Thumb Opposition to Small Finger 2.5cm Able Able  Index MCP (0-90) 75 80 85  Index PIP (0-100) 75 80 90  Index DIP (0-70) 55  65 55  Long MCP (0-90)  85 85 85  Long PIP (0-100)  80 70 85  Long DIP (0-70) 50  70 65  Ring MCP (0-90)  75 82 80  Ring PIP (0-100)  85 75 85  Ring DIP (0-70)  65 70 70  Little MCP (0-90)  75 83 85  Little PIP (0-100)  70 75 80  Little DIP (0-70) 70  80 75  (Blank rows = not tested)   UPPER EXTREMITY MMT:     MMT Right eval Right 09/10/22  Wrist flexion 4+/5 5/5  Wrist extension 5/5 5/5  Wrist ulnar deviation 4+/5 5/5  Wrist radial deviation 4+/5 4+/5  Wrist pronation 4/5 4+/5  Wrist supination 4/5 4+/5  (Blank rows = not tested)  HAND FUNCTION:  Grip strength: Right: 19 lbs; Left: 52 lbs, Lateral pinch: Right: 4 lbs, Left: 12 lbs, and 3 point pinch: Right: 4 lbs, Left: 10 lbs Grip Strength: 08/22/22: Right: 20 lbs; Lateral pinch: Right: 5lbs; 3 Point pinch: Right: 6lbs Grip Strength: 08/22/22: Right: 25 lbs; Lateral pinch: Right: 6lbs; 3 Point pinch: Right: 5lbs  COORDINATION:  9 Hole Peg test: Right: 18.18 sec; Left: 18.78 sec  SENSATION: WFL  EDEMA: Mild swelling noted in the wrist  OBSERVATIONS: mild to moderate fascial restrictions along extensor bundle and palmar aspect around thumb   TODAY'S TREATMENT:                                                                                                                               DATE:   09/10/22 -Wrist A/ROM: flexion, extension, ulnar/radial deviation, supination/pronation, x10 -  Digit ROM: composite flexion, abduction/adduction, opposition, finger taps -FOTO assessment -Measurements for reassessment -Grooved Peg board -Resistance rubber bands, yellow then red band, full hand extension, finger taps, finger abduction, thumb abduction, x10 each exercise, each band  09/02/22 -Ball Rolls: flexion/extension, supination/pronation, ulnar/radial deviation, x15 -Digit ROM: composite flexion, abduction/adduction, finger taps, opposition, x15 -Wrist Strengthening: 3lb dumbbells, flexion, extension, ulnar/radial deviation, supination/pronation, x10 -Wrist ABC's with red weighted ball -Pinch Stacking: green resistance clip, stacking 6 cubes x2 -Digiflex: 7lbs full squeeze x10, 3lbs each digit x6 -Thumb stretches: abduction, extension, 5x10" -Gripper: 22lbs, vertical 5 large beads, 8 medium beads, horizontal 8 small beads\  08/22/22 -Wrist A/ROM: flexion, extension, ulnar/radial deviation, supination/pronation, x15 -Digit ROM: composite flexion, abduction/adduction, finger taps, opposition, x15 -Wrist Strengthening: 3lbs supination, ulnar/radial deviation, flexion, extension, x8 -Red theraputty: roll into ball, flatten into pancake, roll into log, pinch x10, grip x10 -Measurements for mini reassessment    PATIENT EDUCATION: Education details: Animal nutritionist band exercises Person educated: Patient Education method: Programmer, multimedia, Demonstration, and Handouts Education comprehension: verbalized understanding and returned demonstration  HOME EXERCISE PROGRAM: 5/10: Wrist and Digit ROM 5/21: Theraputty (yellow) 5/29: Wrist Strengthening 6/25: Rubber band exercises  GOALS: Goals reviewed with patient? Yes  SHORT TERM GOALS: Target date: 08/30/22  Pt will be provided with and educated on HEP to improve pain and  mobility in R hand required for ADL completion.  Goal status: MET  2.  Pt will decrease pain in R hand and shoulder to 2/10 or less in order to sleep for 3+ consecutive hours without waking due to pain. Goal status: MET  3.  Pt will increase ROM in left digits by 20+ degrees to improve ability to form a full grasp required for doing her hair and makeup with dominant right hand. Goal status: NOT MET  4.  Pt will increase dominant right grip strength by 10# and pinch strength by 4# or greater to improve  Goal status: NOT MET  5.  Pt will decrease RUE fascial restrictions and swelling to minimal amounts or less to improve mobility required typing and writing.  Goal status: MET  6.   Pt will increase RUE strength to 5/5 or greater to improve ability to perform lifting tasks during housekeeping.  Goal status: NOT MET   ASSESSMENT:  CLINICAL IMPRESSION: Pt presented to OT this session for reassessment as discharge as she is returning to work. She has made good improvements in strength in her wrist, grip, and pinch, as well as progress in her ROM, however she has not met her goals in this area. She has been provided a comprehensive HEP, which she is following well, with no concerns. PT will be discharged from OT at this time, per her request with returning back to work.    PERFORMANCE DEFICITS: in functional skills including ADLs, IADLs, sensation, edema, ROM, strength, pain, fascial restrictions, Fine motor control, Gross motor control, body mechanics, and UE functional use.   PLAN:  OT FREQUENCY: 2x/week  OT DURATION: 4 weeks  PLANNED INTERVENTIONS: self care/ADL training, therapeutic exercise, therapeutic activity, manual therapy, scar mobilization, passive range of motion, functional mobility training, splinting, electrical stimulation, ultrasound, paraffin, moist heat, cryotherapy, patient/family education, coping strategies training, and DME and/or AE instructions  RECOMMENDED OTHER  SERVICES: N/A  CONSULTED AND AGREED WITH PLAN OF CARE: Patient  PLAN FOR NEXT SESSION: Discharge  Elvan Ebron Bing Plume, OTR/L [facility] Out[facility] Re[facility] [phone] Mileydi Milsap Rosemarie Beath, OT 09/10/2022, 12:26 PM

## 2022-09-23 DIAGNOSIS — Z4789 Encounter for other orthopedic aftercare: Secondary | ICD-10-CM | POA: Diagnosis not present

## 2022-09-23 DIAGNOSIS — M13841 Other specified arthritis, right hand: Secondary | ICD-10-CM | POA: Diagnosis not present

## 2022-09-23 DIAGNOSIS — M79644 Pain in right finger(s): Secondary | ICD-10-CM | POA: Diagnosis not present

## 2022-09-23 DIAGNOSIS — M79645 Pain in left finger(s): Secondary | ICD-10-CM | POA: Diagnosis not present

## 2022-10-21 ENCOUNTER — Telehealth: Payer: Self-pay | Admitting: Family Medicine

## 2022-10-21 NOTE — Telephone Encounter (Signed)
Patient states having a lot of heart burn and would like something sent in. She would like to change from patches for hot flashes to some type of hormonal medication for hot flashes.Natalie Hess

## 2022-10-22 ENCOUNTER — Other Ambulatory Visit: Payer: Self-pay | Admitting: Family Medicine

## 2022-10-22 MED ORDER — PANTOPRAZOLE SODIUM 40 MG PO TBEC
40.0000 mg | DELAYED_RELEASE_TABLET | Freq: Every day | ORAL | 1 refills | Status: DC
  Filled 2022-12-05: qty 90, 90d supply, fill #0
  Filled 2023-02-24: qty 60, 60d supply, fill #1

## 2022-10-22 NOTE — Telephone Encounter (Signed)
Tommie Sams, DO     Medication sent for reflux.  We need to have discussion/visit regarding hormonal and nonhormonal options.

## 2022-10-23 ENCOUNTER — Encounter: Payer: Self-pay | Admitting: *Deleted

## 2022-10-23 NOTE — Telephone Encounter (Signed)
Patient notified via mychart

## 2022-11-04 ENCOUNTER — Telehealth: Payer: Self-pay | Admitting: Nurse Practitioner

## 2022-11-04 NOTE — Telephone Encounter (Signed)
Patient has physical on 9/25 and needing labs done

## 2022-11-05 ENCOUNTER — Other Ambulatory Visit: Payer: Self-pay | Admitting: Nurse Practitioner

## 2022-11-05 ENCOUNTER — Encounter: Payer: Self-pay | Admitting: Nurse Practitioner

## 2022-11-05 DIAGNOSIS — Z0000 Encounter for general adult medical examination without abnormal findings: Secondary | ICD-10-CM

## 2022-11-05 DIAGNOSIS — Z1329 Encounter for screening for other suspected endocrine disorder: Secondary | ICD-10-CM

## 2022-11-05 DIAGNOSIS — Z130 Encounter for screening for diseases of the blood and blood-forming organs and certain disorders involving the immune mechanism: Secondary | ICD-10-CM

## 2022-11-05 DIAGNOSIS — Z13220 Encounter for screening for lipoid disorders: Secondary | ICD-10-CM

## 2022-11-05 DIAGNOSIS — Z13228 Encounter for screening for other metabolic disorders: Secondary | ICD-10-CM

## 2022-11-05 NOTE — Telephone Encounter (Signed)
Message sent through my chart

## 2022-11-05 NOTE — Telephone Encounter (Signed)
Done

## 2022-11-07 ENCOUNTER — Other Ambulatory Visit (HOSPITAL_BASED_OUTPATIENT_CLINIC_OR_DEPARTMENT_OTHER): Payer: Self-pay

## 2022-12-04 ENCOUNTER — Other Ambulatory Visit (HOSPITAL_COMMUNITY): Payer: Self-pay

## 2022-12-05 ENCOUNTER — Other Ambulatory Visit (HOSPITAL_COMMUNITY): Payer: Self-pay

## 2022-12-06 ENCOUNTER — Other Ambulatory Visit (HOSPITAL_COMMUNITY): Payer: Self-pay

## 2022-12-06 MED ORDER — AMOXICILLIN-POT CLAVULANATE 875-125 MG PO TABS
1.0000 | ORAL_TABLET | Freq: Two times a day (BID) | ORAL | 0 refills | Status: DC
  Filled 2022-12-06: qty 20, 10d supply, fill #0

## 2022-12-09 ENCOUNTER — Other Ambulatory Visit: Payer: Self-pay

## 2022-12-11 ENCOUNTER — Encounter: Payer: 59 | Admitting: Nurse Practitioner

## 2023-02-24 ENCOUNTER — Other Ambulatory Visit (HOSPITAL_COMMUNITY): Payer: Self-pay

## 2023-05-06 ENCOUNTER — Other Ambulatory Visit: Payer: Self-pay | Admitting: Family Medicine

## 2023-05-06 ENCOUNTER — Other Ambulatory Visit: Payer: Self-pay | Admitting: Nurse Practitioner

## 2023-05-06 ENCOUNTER — Ambulatory Visit: Payer: Self-pay | Admitting: Family Medicine

## 2023-05-06 MED ORDER — PANTOPRAZOLE SODIUM 40 MG PO TBEC
40.0000 mg | DELAYED_RELEASE_TABLET | Freq: Every day | ORAL | 0 refills | Status: DC
  Filled 2023-05-09: qty 15, 15d supply, fill #0

## 2023-05-06 NOTE — Telephone Encounter (Signed)
 Dr denied pantoprazole (PROTONIX) 40 MG tablet as pt needed to come in for refills. Scheduled pt's appt and she is asking if she can still get an emergency fill in the meantime. Please f/u   Copied from CRM [phone]. Topic: Clinical - Prescription Issue >> May 06, 2023  3:18 PM Lovey Newcomer R wrote: Reason for CRM: Dr denied pantoprazole (PROTONIX) 40 MG tablet as pt needed to come in for refills. Scheduled pt's appt and she is asking if she can still get an emergency fill in the meantime. Please f/u

## 2023-05-06 NOTE — Telephone Encounter (Signed)
 A refill of 15 tablets was sent in to give patient time for an office visit.

## 2023-05-09 ENCOUNTER — Other Ambulatory Visit (HOSPITAL_COMMUNITY): Payer: Self-pay

## 2023-05-16 ENCOUNTER — Other Ambulatory Visit (HOSPITAL_COMMUNITY): Payer: Self-pay

## 2023-05-28 ENCOUNTER — Other Ambulatory Visit (HOSPITAL_COMMUNITY): Payer: Self-pay

## 2023-05-28 ENCOUNTER — Ambulatory Visit: Payer: 59 | Admitting: Family Medicine

## 2023-05-28 ENCOUNTER — Encounter: Payer: Self-pay | Admitting: Family Medicine

## 2023-05-28 VITALS — BP 114/76 | HR 76 | Temp 98.1°F | Ht 66.0 in | Wt 146.0 lb

## 2023-05-28 DIAGNOSIS — K219 Gastro-esophageal reflux disease without esophagitis: Secondary | ICD-10-CM | POA: Diagnosis not present

## 2023-05-28 DIAGNOSIS — Z13220 Encounter for screening for lipoid disorders: Secondary | ICD-10-CM

## 2023-05-28 DIAGNOSIS — N951 Menopausal and female climacteric states: Secondary | ICD-10-CM | POA: Diagnosis not present

## 2023-05-28 MED ORDER — CITALOPRAM HYDROBROMIDE 10 MG PO TABS
10.0000 mg | ORAL_TABLET | Freq: Every day | ORAL | 1 refills | Status: AC
  Filled 2023-05-28 – 2023-06-03 (×2): qty 90, 90d supply, fill #0

## 2023-05-28 MED ORDER — PANTOPRAZOLE SODIUM 40 MG PO TBEC
40.0000 mg | DELAYED_RELEASE_TABLET | Freq: Every day | ORAL | 1 refills | Status: DC
  Filled 2023-05-28 – 2023-06-03 (×2): qty 90, 90d supply, fill #0
  Filled 2023-08-19: qty 90, 90d supply, fill #1

## 2023-05-28 NOTE — Patient Instructions (Signed)
 Labs ordered.  Medications sent.  Follow up annually.

## 2023-05-29 ENCOUNTER — Other Ambulatory Visit: Payer: Self-pay

## 2023-05-29 ENCOUNTER — Encounter: Payer: Self-pay | Admitting: Family Medicine

## 2023-05-29 DIAGNOSIS — N951 Menopausal and female climacteric states: Secondary | ICD-10-CM | POA: Insufficient documentation

## 2023-05-29 DIAGNOSIS — K219 Gastro-esophageal reflux disease without esophagitis: Secondary | ICD-10-CM | POA: Insufficient documentation

## 2023-05-29 LAB — CMP14+EGFR
ALT: 15 IU/L (ref 0–32)
AST: 15 IU/L (ref 0–40)
Albumin: 4.6 g/dL (ref 3.9–4.9)
Alkaline Phosphatase: 77 IU/L (ref 44–121)
BUN/Creatinine Ratio: 22 (ref 12–28)
BUN: 17 mg/dL (ref 8–27)
Bilirubin Total: 0.3 mg/dL (ref 0.0–1.2)
CO2: 25 mmol/L (ref 20–29)
Calcium: 9.7 mg/dL (ref 8.7–10.3)
Chloride: 101 mmol/L (ref 96–106)
Creatinine, Ser: 0.78 mg/dL (ref 0.57–1.00)
Globulin, Total: 2 g/dL (ref 1.5–4.5)
Glucose: 87 mg/dL (ref 70–99)
Potassium: 4.9 mmol/L (ref 3.5–5.2)
Sodium: 139 mmol/L (ref 134–144)
Total Protein: 6.6 g/dL (ref 6.0–8.5)
eGFR: 85 mL/min/{1.73_m2} (ref >59)

## 2023-05-29 LAB — CBC
Hematocrit: 39 % (ref 34.0–46.6)
Hemoglobin: 13.3 g/dL (ref 11.1–15.9)
MCH: 30.4 pg (ref 26.6–33.0)
MCHC: 34.1 g/dL (ref 31.5–35.7)
MCV: 89 fL (ref 79–97)
Platelets: 241 10*3/uL (ref 150–450)
RBC: 4.38 x10E6/uL (ref 3.77–5.28)
RDW: 11.7 % (ref 11.7–15.4)
WBC: 4 10*3/uL (ref 3.4–10.8)

## 2023-05-29 LAB — LIPID PANEL
Chol/HDL Ratio: 2.3 ratio (ref 0.0–4.4)
Cholesterol, Total: 230 mg/dL — ABNORMAL HIGH (ref 100–199)
HDL: 101 mg/dL (ref >39)
LDL Chol Calc (NIH): 114 mg/dL — ABNORMAL HIGH (ref 0–99)
Triglycerides: 87 mg/dL (ref 0–149)
VLDL Cholesterol Cal: 15 mg/dL (ref 5–40)

## 2023-05-29 NOTE — Progress Notes (Signed)
 Subjective:  Patient ID: Natalie Hess, female    DOB: [birth year]  Age: 63 y.o. MRN: [id number]  CC:   Chief Complaint  Patient presents with   Medication Refill    HPI:  63 year old female presents for follow-up.  GERD is stable on Protonix.  Needs refill.  Patient states that she has stopped estradiol patch due to concern for clot.  She has a family ember who recently developed a clot from hormone replacement therapy.  Patient states that she has significant hot flashes and would like to discuss treatment options for this.  Patiently, patient needs routine lab work.  Patient Active Problem List   Diagnosis Date Noted   GERD (gastroesophageal reflux disease) 05/29/2023   Hot flashes, menopausal 05/29/2023   S/P ACL repair right 12/25/17 05/01/2017    Social Hx   Social History   Socioeconomic History   Marital status: Married    Spouse name: Not on file   Number of children: Not on file   Years of education: Not on file   Highest education level: Not on file  Occupational History   Not on file  Tobacco Use   Smoking status: Former    Types: Cigarettes   Smokeless tobacco: Never  Vaping Use   Vaping status: Never Used  Substance and Sexual Activity   Alcohol use: Yes    Alcohol/week: 0.0 standard drinks of alcohol    Comment: socially   Drug use: No   Sexual activity: Yes    Birth control/protection: Surgical  Other Topics Concern   Not on file  Social History Narrative   Not on file   Social Drivers of Health   Financial Resource Strain: Not on file  Food Insecurity: Not on file  Transportation Needs: Not on file  Physical Activity: Not on file  Stress: Not on file  Social Connections: Not on file    Review of Systems Per HPI  Objective:  BP 114/76   Pulse 76   Temp 98.1 F (36.7 C)   Ht 5\' 6"  (1.676 m)   Wt 146 lb (66.2 kg)   SpO2 97%   BMI 23.57 kg/m      05/28/2023    2:56 PM 06/11/2022    5:35 PM 06/11/2022    5:20 PM  BP/Weight   Systolic BP 114 159 139  Diastolic BP 76 96 89  Wt. (Lbs) 146    BMI 23.57 kg/m2      Physical Exam Vitals and nursing note reviewed.  Constitutional:      General: She is not in acute distress.    Appearance: Normal appearance.  HENT:     Head: Normocephalic and atraumatic.  Eyes:     General:        Right eye: No discharge.        Left eye: No discharge.     Conjunctiva/sclera: Conjunctivae normal.  Cardiovascular:     Rate and Rhythm: Normal rate and regular rhythm.  Pulmonary:     Effort: Pulmonary effort is normal.     Breath sounds: Normal breath sounds. No wheezing, rhonchi or rales.  Neurological:     Mental Status: She is alert.  Psychiatric:        Mood and Affect: Mood normal.        Behavior: Behavior normal.     Lab Results  Component Value Date   WBC 4.0 05/28/2023   HGB 13.3 05/28/2023   HCT 39.0 05/28/2023   PLT 241  05/28/2023   GLUCOSE 87 05/28/2023   CHOL 230 (H) 05/28/2023   TRIG 87 05/28/2023   HDL 101 05/28/2023   LDLCALC 114 (H) 05/28/2023   ALT 15 05/28/2023   AST 15 05/28/2023   NA 139 05/28/2023   K 4.9 05/28/2023   CL 101 05/28/2023   CREATININE 0.78 05/28/2023   BUN 17 05/28/2023   CO2 25 05/28/2023   TSH 0.948 08/15/2017     Assessment & Plan:  Gastroesophageal reflux disease without esophagitis Assessment & Plan: Stable on Protonix.  Continue.  Refilled today.   Hot flashes, menopausal Assessment & Plan: Chronic issue.  Uncontrolled/worsening due to recent cessation of estradiol.  Trial of Celexa.  Orders: -     CMP14+EGFR -     CBC  Screening for lipid disorders -     Lipid panel  Other orders -     Pantoprazole Sodium; Take 1 tablet (40 mg total) by mouth daily.  Dispense: 90 tablet; Refill: 1 -     Citalopram Hydrobromide; Take 1 tablet (10 mg total) by mouth daily.  Dispense: 90 tablet; Refill: 1    Follow-up:  Annually  Everlene Other DO [facility]

## 2023-05-29 NOTE — Assessment & Plan Note (Signed)
 Chronic issue.  Uncontrolled/worsening due to recent cessation of estradiol.  Trial of Celexa.

## 2023-05-29 NOTE — Assessment & Plan Note (Signed)
Stable on Protonix.  Continue.  Refilled today. 

## 2023-06-03 ENCOUNTER — Other Ambulatory Visit (HOSPITAL_COMMUNITY): Payer: Self-pay

## 2023-06-03 ENCOUNTER — Other Ambulatory Visit: Payer: Self-pay

## 2023-06-04 ENCOUNTER — Other Ambulatory Visit: Payer: Self-pay

## 2023-08-19 ENCOUNTER — Other Ambulatory Visit (HOSPITAL_COMMUNITY): Payer: Self-pay

## 2023-09-18 ENCOUNTER — Ambulatory Visit (HOSPITAL_COMMUNITY)
Admission: RE | Admit: 2023-09-18 | Discharge: 2023-09-18 | Disposition: A | Discharge status: Home / Self Care | Source: Ambulatory Visit | Attending: Family Medicine | Admitting: Family Medicine

## 2023-09-18 ENCOUNTER — Ambulatory Visit: Admitting: Family Medicine

## 2023-09-18 ENCOUNTER — Other Ambulatory Visit: Payer: Self-pay | Admitting: Family Medicine

## 2023-09-18 ENCOUNTER — Encounter: Payer: Self-pay | Admitting: Family Medicine

## 2023-09-18 ENCOUNTER — Ambulatory Visit: Payer: Self-pay | Admitting: Family Medicine

## 2023-09-18 VITALS — BP 124/82 | HR 71 | Temp 97.2°F | Ht 66.0 in | Wt 141.0 lb

## 2023-09-18 DIAGNOSIS — R1032 Left lower quadrant pain: Secondary | ICD-10-CM | POA: Diagnosis not present

## 2023-09-18 DIAGNOSIS — R1030 Lower abdominal pain, unspecified: Secondary | ICD-10-CM | POA: Diagnosis not present

## 2023-09-18 DIAGNOSIS — K5732 Diverticulitis of large intestine without perforation or abscess without bleeding: Secondary | ICD-10-CM | POA: Diagnosis not present

## 2023-09-18 MED ORDER — AMOXICILLIN-POT CLAVULANATE 875-125 MG PO TABS: 1.0000 | ORAL_TABLET | Freq: Two times a day (BID) | ORAL | 0 refills | Status: DC

## 2023-09-18 MED ORDER — IOHEXOL 300 MG/ML  SOLN
100.0000 mL | Freq: Once | INTRAMUSCULAR | Status: AC | PRN
  Administered 2023-09-18: 100 mL via INTRAVENOUS

## 2023-09-18 NOTE — Assessment & Plan Note (Signed)
 Has a history of colitis.  Labs and CT for further evaluation today.

## 2023-09-18 NOTE — Progress Notes (Signed)
 Subjective:  Patient ID: Natalie Hess, female    DOB: [birth year]  Age: 63 y.o. MRN: [id number]  CC:   Chief Complaint  Patient presents with   Abdominal Pain    Abd pain, spasms, bloating  x's 1 week. Currently not too bad    HPI:  63 year old female presents with abdominal pain.  Abdominal pain started on Thursday.  Abdominal pain located in the lower abdomen.  Associated bloating.  Subsequently had constipation and then diarrhea.  Has continued to have loose stools.  She has a history of colitis.  Denies fever.  Denies nausea or vomiting.  Continues to feel fullness in the abdomen.  No known relieving factors.  No changes in diet.  Pain has been severe.  Patient Active Problem List   Diagnosis Date Noted   Lower abdominal pain 09/18/2023   GERD (gastroesophageal reflux disease) 05/29/2023   Hot flashes, menopausal 05/29/2023   S/P ACL repair right 12/25/17 05/01/2017    Social Hx   Social History   Socioeconomic History   Marital status: Married    Spouse name: Not on file   Number of children: Not on file   Years of education: Not on file   Highest education level: Not on file  Occupational History   Not on file  Tobacco Use   Smoking status: Former    Types: Cigarettes   Smokeless tobacco: Never  Vaping Use   Vaping status: Never Used  Substance and Sexual Activity   Alcohol use: Yes    Alcohol/week: 0.0 standard drinks of alcohol    Comment: socially   Drug use: No   Sexual activity: Yes    Birth control/protection: Surgical  Other Topics Concern   Not on file  Social History Narrative   Not on file   Social Drivers of Health   Financial Resource Strain: Not on file  Food Insecurity: Not on file  Transportation Needs: Not on file  Physical Activity: Not on file  Stress: Not on file  Social Connections: Not on file    Review of Systems Per HPI  Objective:  BP 124/82   Pulse 71   Temp (!) 97.2 F (36.2 C)   Ht 5' 6 (1.676 m)   Wt 141 lb (64  kg)   SpO2 98%   BMI 22.76 kg/m      09/18/2023    3:51 PM 05/28/2023    2:56 PM 06/11/2022    5:35 PM  BP/Weight  Systolic BP 124 114 159  Diastolic BP 82 76 96  Wt. (Lbs) 141 146   BMI 22.76 kg/m2 23.57 kg/m2     Physical Exam Vitals and nursing note reviewed.  Constitutional:      General: She is not in acute distress. HENT:     Head: Normocephalic and atraumatic.  Pulmonary:     Effort: Pulmonary effort is normal. No respiratory distress.  Abdominal:     Palpations: Abdomen is soft.     Comments: Protuberant abdomen.  No mass.  Mild tenderness in the lower abdomen.  No rebound or guarding.  Normal bowel sounds.  Neurological:     Mental Status: She is alert.     Lab Results  Component Value Date   WBC 4.0 05/28/2023   HGB 13.3 05/28/2023   HCT 39.0 05/28/2023   PLT 241 05/28/2023   GLUCOSE 87 05/28/2023   CHOL 230 (H) 05/28/2023   TRIG 87 05/28/2023   HDL 101 05/28/2023   LDLCALC 114 (  H) 05/28/2023   ALT 15 05/28/2023   AST 15 05/28/2023   NA 139 05/28/2023   K 4.9 05/28/2023   CL 101 05/28/2023   CREATININE 0.78 05/28/2023   BUN 17 05/28/2023   CO2 25 05/28/2023   TSH 0.948 08/15/2017     Assessment & Plan:  Lower abdominal pain Assessment & Plan: Has a history of colitis.  Labs and CT for further evaluation today.  Orders: -     CBC with Differential/Platelet -     Comprehensive metabolic panel with GFR -     Lipase -     CT ABDOMEN PELVIS W CONTRAST    Follow-up:  Pending results  Daianna Vasques Bluford DO [facility]

## 2023-09-24 ENCOUNTER — Ambulatory Visit: Payer: Self-pay

## 2023-09-24 NOTE — Telephone Encounter (Signed)
 FYI Only or Action Required?: Action required by provider: clinical question for provider.  Patient was last seen in primary care on 09/18/2023 by Cook, Jayce G, DO.  Called Nurse Triage reporting Abdominal Pain.  Symptoms began a week ago.  Interventions attempted: Prescription medications: antibiotics.  Symptoms are: unchanged.  Triage Disposition: Call PCP Now  Patient/caregiver understands and will follow disposition?: No, wishes to speak with PCP    Patient called in complaining of abdominal pain. She was seen by Dr. Bluford on Monday and received a CT scan (no significant findings per patient). Based on her appointment she has been diagnosed with diverticulitis. The pain is back and she wants to know how to prevent stomach spasms. She stated the provider did not provide any education on the condition. I was able to provide some education on the condition/diet choices, however patient would like to further discuss with provider.                       Copied from CRM [id number]. Topic: Clinical - Red Word Triage >> Sep 24, 2023  2:36 PM Charlet HERO wrote: Red Word that prompted transfer to Nurse Triage: Patient is calling about issues with her stomach she had a ct scan on 07/03. She wants to know if there is something she can do or take instead of eating. Reason for Disposition  [1] Caller requests to speak ONLY to PCP AND [2] URGENT question  Protocols used: PCP Call - No Triage-A-AH

## 2023-09-25 ENCOUNTER — Encounter: Payer: Self-pay | Admitting: Family Medicine

## 2023-09-25 ENCOUNTER — Other Ambulatory Visit: Payer: Self-pay

## 2023-09-25 ENCOUNTER — Other Ambulatory Visit (HOSPITAL_COMMUNITY)
Admission: RE | Admit: 2023-09-25 | Discharge: 2023-09-25 | Disposition: A | Discharge status: Home / Self Care | Source: Ambulatory Visit | Attending: Family Medicine | Admitting: Family Medicine

## 2023-09-25 ENCOUNTER — Ambulatory Visit: Payer: Self-pay | Admitting: Family Medicine

## 2023-09-25 DIAGNOSIS — K5792 Diverticulitis of intestine, part unspecified, without perforation or abscess without bleeding: Secondary | ICD-10-CM

## 2023-09-25 DIAGNOSIS — R1030 Lower abdominal pain, unspecified: Secondary | ICD-10-CM

## 2023-09-25 LAB — CBC WITH DIFFERENTIAL/PLATELET
Abs Immature Granulocytes: 0.01 K/uL (ref 0.00–0.07)
Basophils Absolute: 0 K/uL (ref 0.0–0.1)
Basophils Relative: 1 %
Eosinophils Absolute: 0.1 K/uL (ref 0.0–0.5)
Eosinophils Relative: 2 %
HCT: 42.2 % (ref 36.0–46.0)
Hemoglobin: 14.1 g/dL (ref 12.0–15.0)
Immature Granulocytes: 0 %
Lymphocytes Relative: 28 %
Lymphs Abs: 1.5 K/uL (ref 0.7–4.0)
MCH: 30.3 pg (ref 26.0–34.0)
MCHC: 33.4 g/dL (ref 30.0–36.0)
MCV: 90.6 fL (ref 80.0–100.0)
Monocytes Absolute: 0.4 K/uL (ref 0.1–1.0)
Monocytes Relative: 7 %
Neutro Abs: 3.2 K/uL (ref 1.7–7.7)
Neutrophils Relative %: 62 %
Platelets: 270 K/uL (ref 150–400)
RBC: 4.66 MIL/uL (ref 3.87–5.11)
RDW: 11.9 % (ref 11.5–15.5)
WBC: 5.2 K/uL (ref 4.0–10.5)
nRBC: 0 % (ref 0.0–0.2)

## 2023-09-25 LAB — COMPREHENSIVE METABOLIC PANEL WITH GFR
ALT: 18 U/L (ref 0–44)
AST: 25 U/L (ref 15–41)
Albumin: 4.2 g/dL (ref 3.5–5.0)
Alkaline Phosphatase: 62 U/L (ref 38–126)
Anion gap: 8 (ref 5–15)
BUN: 12 mg/dL (ref 8–23)
CO2: 28 mmol/L (ref 22–32)
Calcium: 9.7 mg/dL (ref 8.9–10.3)
Chloride: 100 mmol/L (ref 98–111)
Creatinine, Ser: 0.79 mg/dL (ref 0.44–1.00)
GFR, Estimated: >60 mL/min (ref >60)
Glucose, Bld: 110 mg/dL — ABNORMAL HIGH (ref 70–99)
Potassium: 4.4 mmol/L (ref 3.5–5.1)
Sodium: 136 mmol/L (ref 135–145)
Total Bilirubin: 0.4 mg/dL (ref 0.0–1.2)
Total Protein: 7.2 g/dL (ref 6.5–8.1)

## 2023-09-25 LAB — LIPASE, BLOOD: Lipase: 35 U/L (ref 11–51)

## 2023-09-25 NOTE — Telephone Encounter (Signed)
 Spoke with patient and she states she had her CT scan after her appt last week, she has been taking her abx and on clear liquids only, she is back at work after feeling dizzy, nausea, diarrhea and abdominal spasms. She is not dizzy today but still has abd spasms. She would like additional recommendations if possible. Please advise

## 2023-09-25 NOTE — Telephone Encounter (Signed)
 FYI   Copied from CRM [id number]. 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 Doesn't want to come in given she already saw Dr. Bluford, states she is feeling a little better today since she's on a clear liquid diet. please advise # [phone] or via my chart. Ty

## 2023-09-25 NOTE — Telephone Encounter (Signed)
MyChart message was sent.

## 2023-09-25 NOTE — Telephone Encounter (Signed)
 Recommend follow-up office visit with myself When she was seen by Dr. Bluford he did order lab work she did not get these as best I can tell She needs to go today-please order the stat through the hospital lab out patient We do have the ability to see her tomorrow my schedule is full today if the patient feels her problems are severe I would recommend ER otherwise I would recommend doing the lab work today and being seen tomorrow Also medical studies show that it is okay to be on a soft diet with diverticulitis so therefore unless she feels she needs to be on clear liquids I would encourage her to eat a diet that has soft fruits vegetables yogurts any of those type of stuff to give her a little more substance and calories thank you

## 2023-09-26 NOTE — Telephone Encounter (Signed)
 See My chart message from provider with results

## 2023-10-06 NOTE — Telephone Encounter (Signed)
 Nurses It would be best for this message to go to Dr. Bluford and he could handle later this week when he comes back thank you

## 2023-10-10 ENCOUNTER — Other Ambulatory Visit: Payer: Self-pay

## 2023-10-10 ENCOUNTER — Other Ambulatory Visit (HOSPITAL_COMMUNITY): Payer: Self-pay

## 2023-10-10 ENCOUNTER — Other Ambulatory Visit: Payer: Self-pay | Admitting: Family Medicine

## 2023-10-10 MED ORDER — VEOZAH 45 MG PO TABS: 45.0000 mg | ORAL_TABLET | Freq: Every day | ORAL | 3 refills | Status: DC

## 2023-10-10 MED ORDER — VEOZAH 45 MG PO TABS
45.0000 mg | ORAL_TABLET | Freq: Every day | ORAL | 3 refills | Status: AC
  Filled 2023-10-10: qty 30, 30d supply, fill #0
  Filled 2023-11-13: qty 30, 30d supply, fill #1

## 2023-10-14 ENCOUNTER — Other Ambulatory Visit (HOSPITAL_COMMUNITY): Payer: Self-pay

## 2023-11-13 ENCOUNTER — Other Ambulatory Visit (HOSPITAL_COMMUNITY): Payer: Self-pay

## 2023-11-14 ENCOUNTER — Other Ambulatory Visit: Payer: Self-pay

## 2023-11-24 DIAGNOSIS — Z1283 Encounter for screening for malignant neoplasm of skin: Secondary | ICD-10-CM | POA: Diagnosis not present

## 2023-11-24 DIAGNOSIS — L820 Inflamed seborrheic keratosis: Secondary | ICD-10-CM | POA: Diagnosis not present

## 2023-11-24 DIAGNOSIS — D225 Melanocytic nevi of trunk: Secondary | ICD-10-CM | POA: Diagnosis not present

## 2023-11-28 ENCOUNTER — Other Ambulatory Visit: Payer: Self-pay | Admitting: Family Medicine

## 2023-11-28 ENCOUNTER — Other Ambulatory Visit (HOSPITAL_COMMUNITY): Payer: Self-pay

## 2023-12-01 ENCOUNTER — Other Ambulatory Visit (HOSPITAL_COMMUNITY): Payer: Self-pay

## 2023-12-01 MED ORDER — PANTOPRAZOLE SODIUM 40 MG PO TBEC
40.0000 mg | DELAYED_RELEASE_TABLET | Freq: Every day | ORAL | 1 refills | Status: AC
  Filled 2023-12-01: qty 90, 90d supply, fill #0
  Filled 2024-01-07 – 2024-02-25 (×2): qty 90, 90d supply, fill #1

## 2024-01-07 ENCOUNTER — Other Ambulatory Visit (HOSPITAL_COMMUNITY): Payer: Self-pay

## 2024-02-25 ENCOUNTER — Other Ambulatory Visit (HOSPITAL_COMMUNITY): Payer: Self-pay

## 2024-02-25 ENCOUNTER — Ambulatory Visit: Admitting: Family Medicine

## 2024-02-25 VITALS — BP 170/100 | HR 98 | Temp 97.7°F | Ht 66.0 in | Wt 144.0 lb

## 2024-02-25 DIAGNOSIS — J019 Acute sinusitis, unspecified: Secondary | ICD-10-CM | POA: Diagnosis not present

## 2024-02-25 DIAGNOSIS — R030 Elevated blood-pressure reading, without diagnosis of hypertension: Secondary | ICD-10-CM | POA: Insufficient documentation

## 2024-02-25 MED ORDER — AMOXICILLIN-POT CLAVULANATE 875-125 MG PO TABS: 1.0000 | ORAL_TABLET | Freq: Two times a day (BID) | ORAL | 0 refills | Status: AC

## 2024-02-25 NOTE — Assessment & Plan Note (Signed)
Treating with Augmentin.  °Work note given. °

## 2024-02-25 NOTE — Progress Notes (Signed)
 Subjective:  Patient ID: Natalie Hess, female    DOB: May 26, 1960  Age: 63 y.o. MRN: [id number]  CC:   Chief Complaint  Patient presents with   URI    Multiple symptoms since Monday has fever of 100 Congestion, non prod cough     HPI:  63 year old female presents with respiratory symptoms.  Patient reports that she has been sick since this past weekend.  Symptoms worsened on Monday.  She had chills and low-grade temp of 100.  She was sent home from work.  She works in the OR.  She states that she has had sinus pressure and congestion.  Associated headache.  She has had some generalized weakness and fatigue as well.  Body aches.  No current fever.  She states that she is not having much cough.  She has been using over-the-counter medication without resolution.  Patient Active Problem List   Diagnosis Date Noted   Acute sinusitis 02/25/2024   Elevated BP without diagnosis of hypertension 02/25/2024   GERD (gastroesophageal reflux disease) 05/29/2023   Hot flashes, menopausal 05/29/2023   S/P ACL repair right 12/25/17 05/01/2017    Social Hx   Social History   Socioeconomic History   Marital status: Married    Spouse name: Not on file   Number of children: Not on file   Years of education: Not on file   Highest education level: Not on file  Occupational History   Not on file  Tobacco Use   Smoking status: Former    Types: Cigarettes   Smokeless tobacco: Never  Vaping Use   Vaping status: Never Used  Substance and Sexual Activity   Alcohol use: Yes    Alcohol/week: 0.0 standard drinks of alcohol    Comment: socially   Drug use: No   Sexual activity: Yes    Birth control/protection: Surgical  Other Topics Concern   Not on file  Social History Narrative   Not on file   Social Drivers of Health   Financial Resource Strain: Not on file  Food Insecurity: Not on file  Transportation Needs: Not on file  Physical Activity: Not on file  Stress: Not on file  Social  Connections: Not on file    Review of Systems Per HPI  Objective:  BP (!) 170/100   Pulse 98   Temp 97.7 F (36.5 C)   Ht 5' 6 (1.676 m)   Wt 144 lb (65.3 kg)   SpO2 96%   BMI 23.24 kg/m      02/25/2024   11:45 AM 02/25/2024   11:36 AM 09/18/2023    3:51 PM  BP/Weight  Systolic BP 170 180 124  Diastolic BP 100 99 82  Wt. (Lbs)  144 141  BMI  23.24 kg/m2 22.76 kg/m2    Physical Exam Vitals and nursing note reviewed.  Constitutional:      General: She is not in acute distress.    Appearance: Normal appearance.  HENT:     Head: Normocephalic and atraumatic.     Mouth/Throat:     Pharynx: Oropharynx is clear.  Cardiovascular:     Rate and Rhythm: Normal rate and regular rhythm.  Pulmonary:     Effort: Pulmonary effort is normal.     Breath sounds: Normal breath sounds. No wheezing, rhonchi or rales.  Neurological:     Mental Status: She is alert.     Lab Results  Component Value Date   WBC 5.2 09/25/2023   HGB 14.1  09/25/2023   HCT 42.2 09/25/2023   PLT 270 09/25/2023   GLUCOSE 110 (H) 09/25/2023   CHOL 230 (H) 05/28/2023   TRIG 87 05/28/2023   HDL 101 05/28/2023   LDLCALC 114 (H) 05/28/2023   ALT 18 09/25/2023   AST 25 09/25/2023   NA 136 09/25/2023   K 4.4 09/25/2023   CL 100 09/25/2023   CREATININE 0.79 09/25/2023   BUN 12 09/25/2023   CO2 28 09/25/2023   TSH 0.948 08/15/2017     Assessment & Plan:  Acute sinusitis, recurrence not specified, unspecified location Assessment & Plan: Treating with Augmentin .  Work note given.  Orders: -     Amoxicillin -Pot Clavulanate; Take 1 tablet by mouth 2 (two) times daily.  Dispense: 14 tablet; Refill: 0  Elevated BP without diagnosis of hypertension Assessment & Plan: Patient does not have a history of hypertension.  She is normally normotensive.  I suspect this is due to over-the-counter medication use.  Advised her to monitor her blood pressure closely     Follow-up:  Return if symptoms worsen  or fail to improve.  Jacqulyn Ahle DO [facility]

## 2024-02-25 NOTE — Assessment & Plan Note (Signed)
 Patient does not have a history of hypertension.  She is normally normotensive.  I suspect this is due to over-the-counter medication use.  Advised her to monitor her blood pressure closely
